# Patient Record
Sex: Female | Born: 1940 | Race: White | Hispanic: No | Marital: Married | Smoking: Former smoker
Health system: Southern US, Community
[De-identification: ages and names within clinical notes are randomized; demographics above are authoritative.]

## PROBLEM LIST (undated history)

## (undated) DIAGNOSIS — G4733 Obstructive sleep apnea (adult) (pediatric): Secondary | ICD-10-CM

## (undated) DIAGNOSIS — R58 Hemorrhage, not elsewhere classified: Secondary | ICD-10-CM

## (undated) DIAGNOSIS — I2510 Atherosclerotic heart disease of native coronary artery without angina pectoris: Secondary | ICD-10-CM

## (undated) DIAGNOSIS — Z794 Long term (current) use of insulin: Secondary | ICD-10-CM

## (undated) DIAGNOSIS — J449 Chronic obstructive pulmonary disease, unspecified: Secondary | ICD-10-CM

## (undated) DIAGNOSIS — R609 Edema, unspecified: Secondary | ICD-10-CM

## (undated) DIAGNOSIS — J45909 Unspecified asthma, uncomplicated: Secondary | ICD-10-CM

## (undated) DIAGNOSIS — N183 Chronic kidney disease, stage 3 unspecified: Secondary | ICD-10-CM

## (undated) DIAGNOSIS — R0989 Other specified symptoms and signs involving the circulatory and respiratory systems: Secondary | ICD-10-CM

## (undated) DIAGNOSIS — E119 Type 2 diabetes mellitus without complications: Secondary | ICD-10-CM

## (undated) DIAGNOSIS — I10 Essential (primary) hypertension: Secondary | ICD-10-CM

## (undated) DIAGNOSIS — K219 Gastro-esophageal reflux disease without esophagitis: Secondary | ICD-10-CM

## (undated) DIAGNOSIS — E785 Hyperlipidemia, unspecified: Secondary | ICD-10-CM

## (undated) DIAGNOSIS — F419 Anxiety disorder, unspecified: Secondary | ICD-10-CM

## (undated) DIAGNOSIS — I350 Nonrheumatic aortic (valve) stenosis: Secondary | ICD-10-CM

## (undated) HISTORY — DX: Essential (primary) hypertension: I10

## (undated) HISTORY — DX: Long term (current) use of insulin: E11.9

## (undated) HISTORY — DX: Edema, unspecified: R60.9

## (undated) HISTORY — DX: Obstructive sleep apnea (adult) (pediatric): G47.33

## (undated) HISTORY — DX: Other specified symptoms and signs involving the circulatory and respiratory systems: R09.89

## (undated) HISTORY — PX: APPENDECTOMY: SHX54

## (undated) HISTORY — PX: CARDIAC CATHETERIZATION: SHX172

## (undated) HISTORY — DX: Unspecified asthma, uncomplicated: J45.909

## (undated) HISTORY — DX: Anxiety disorder, unspecified: F41.9

## (undated) HISTORY — DX: Long term (current) use of insulin: Z79.4

## (undated) HISTORY — DX: Gastro-esophageal reflux disease without esophagitis: K21.9

## (undated) HISTORY — PX: PERCUTANEOUS CORONARY STENT INTERVENTION (PCI-S): SHX6016

## (undated) HISTORY — DX: Chronic obstructive pulmonary disease, unspecified: J44.9

---

## 1999-11-09 ENCOUNTER — Encounter: Payer: Self-pay | Admitting: Cardiology

## 1999-11-09 ENCOUNTER — Inpatient Hospital Stay (HOSPITAL_COMMUNITY): Admission: EM | Admit: 1999-11-09 | Discharge: 1999-11-20 | Payer: Self-pay | Admitting: Cardiology

## 1999-11-11 ENCOUNTER — Encounter: Payer: Self-pay | Admitting: Cardiology

## 1999-11-18 HISTORY — PX: CARDIAC CATHETERIZATION: SHX172

## 1999-12-28 ENCOUNTER — Ambulatory Visit: Admission: RE | Admit: 1999-12-28 | Discharge: 1999-12-28 | Payer: Self-pay | Admitting: Pulmonary Disease

## 2002-02-13 ENCOUNTER — Encounter: Payer: Self-pay | Admitting: Cardiology

## 2002-02-13 ENCOUNTER — Inpatient Hospital Stay (HOSPITAL_COMMUNITY): Admission: EM | Admit: 2002-02-13 | Discharge: 2002-02-18 | Payer: Self-pay | Admitting: Emergency Medicine

## 2002-02-17 HISTORY — PX: PERCUTANEOUS CORONARY STENT INTERVENTION (PCI-S): SHX6016

## 2002-04-29 ENCOUNTER — Encounter: Payer: Self-pay | Admitting: Cardiology

## 2002-04-29 ENCOUNTER — Ambulatory Visit (HOSPITAL_COMMUNITY): Admission: RE | Admit: 2002-04-29 | Discharge: 2002-04-30 | Payer: Self-pay | Admitting: Cardiology

## 2002-04-29 HISTORY — PX: CORONARY ANGIOPLASTY: SHX604

## 2003-02-11 ENCOUNTER — Ambulatory Visit (HOSPITAL_COMMUNITY): Admission: RE | Admit: 2003-02-11 | Discharge: 2003-02-12 | Payer: Self-pay | Admitting: Cardiology

## 2005-01-27 ENCOUNTER — Ambulatory Visit: Payer: Self-pay | Admitting: Cardiology

## 2005-02-06 ENCOUNTER — Ambulatory Visit: Payer: Self-pay | Admitting: Cardiology

## 2005-05-17 ENCOUNTER — Ambulatory Visit: Payer: Self-pay | Admitting: Cardiology

## 2006-01-23 ENCOUNTER — Ambulatory Visit: Payer: Self-pay | Admitting: Cardiology

## 2007-02-07 ENCOUNTER — Ambulatory Visit: Payer: Self-pay | Admitting: Cardiology

## 2007-02-21 ENCOUNTER — Ambulatory Visit: Payer: Self-pay | Admitting: Internal Medicine

## 2007-02-21 ENCOUNTER — Encounter: Payer: Self-pay | Admitting: Cardiology

## 2007-02-21 ENCOUNTER — Ambulatory Visit: Payer: Self-pay

## 2007-06-19 ENCOUNTER — Ambulatory Visit: Payer: Self-pay | Admitting: Cardiology

## 2007-06-19 LAB — CONVERTED CEMR LAB
BUN: 17 mg/dL (ref 6–23)
Creatinine, Ser: 1.5 mg/dL — ABNORMAL HIGH (ref 0.4–1.2)
GFR calc non Af Amer: 37 mL/min
Magnesium: 1.7 mg/dL (ref 1.5–2.5)

## 2007-06-27 ENCOUNTER — Ambulatory Visit: Payer: Self-pay | Admitting: Cardiology

## 2007-06-27 LAB — CONVERTED CEMR LAB
BUN: 16 mg/dL (ref 6–23)
Calcium: 9.2 mg/dL (ref 8.4–10.5)
Chloride: 104 meq/L (ref 96–112)
Glucose, Bld: 123 mg/dL — ABNORMAL HIGH (ref 70–99)
Potassium: 4 meq/L (ref 3.5–5.1)
Sodium: 139 meq/L (ref 135–145)

## 2007-07-03 ENCOUNTER — Ambulatory Visit: Payer: Self-pay | Admitting: Cardiology

## 2007-07-03 LAB — CONVERTED CEMR LAB
CO2: 29 meq/L (ref 19–32)
GFR calc Af Amer: 53 mL/min
GFR calc non Af Amer: 43 mL/min
Glucose, Bld: 139 mg/dL — ABNORMAL HIGH (ref 70–99)

## 2007-07-30 ENCOUNTER — Ambulatory Visit: Payer: Self-pay | Admitting: Cardiology

## 2007-09-11 ENCOUNTER — Ambulatory Visit: Payer: Self-pay | Admitting: Cardiology

## 2007-09-11 LAB — CONVERTED CEMR LAB
BUN: 9 mg/dL (ref 6–23)
Calcium: 9.7 mg/dL (ref 8.4–10.5)
Chloride: 105 meq/L (ref 96–112)
Creatinine, Ser: 1.2 mg/dL (ref 0.4–1.2)
GFR calc non Af Amer: 47 mL/min

## 2007-11-07 ENCOUNTER — Ambulatory Visit: Payer: Self-pay | Admitting: Cardiology

## 2008-05-14 ENCOUNTER — Ambulatory Visit: Payer: Self-pay | Admitting: Cardiology

## 2008-12-31 ENCOUNTER — Ambulatory Visit: Payer: Self-pay | Admitting: Cardiology

## 2009-04-22 ENCOUNTER — Ambulatory Visit: Payer: Self-pay | Admitting: Cardiology

## 2009-04-22 DIAGNOSIS — R0989 Other specified symptoms and signs involving the circulatory and respiratory systems: Secondary | ICD-10-CM | POA: Insufficient documentation

## 2009-04-22 DIAGNOSIS — J45901 Unspecified asthma with (acute) exacerbation: Secondary | ICD-10-CM | POA: Insufficient documentation

## 2009-04-22 DIAGNOSIS — I2510 Atherosclerotic heart disease of native coronary artery without angina pectoris: Secondary | ICD-10-CM | POA: Insufficient documentation

## 2009-04-22 DIAGNOSIS — E7800 Pure hypercholesterolemia, unspecified: Secondary | ICD-10-CM | POA: Insufficient documentation

## 2009-04-28 ENCOUNTER — Ambulatory Visit: Payer: Self-pay

## 2009-04-28 ENCOUNTER — Encounter: Payer: Self-pay | Admitting: Cardiology

## 2009-07-28 ENCOUNTER — Encounter (INDEPENDENT_AMBULATORY_CARE_PROVIDER_SITE_OTHER): Payer: Self-pay

## 2009-07-28 ENCOUNTER — Ambulatory Visit: Payer: Self-pay | Admitting: Cardiology

## 2009-07-28 ENCOUNTER — Encounter: Payer: Self-pay | Admitting: Cardiology

## 2009-08-02 ENCOUNTER — Ambulatory Visit: Payer: Self-pay | Admitting: Cardiology

## 2009-08-02 ENCOUNTER — Inpatient Hospital Stay (HOSPITAL_BASED_OUTPATIENT_CLINIC_OR_DEPARTMENT_OTHER): Admission: RE | Admit: 2009-08-02 | Discharge: 2009-08-02 | Payer: Self-pay | Admitting: Cardiology

## 2009-08-19 ENCOUNTER — Ambulatory Visit: Payer: Self-pay | Admitting: Cardiology

## 2009-08-19 ENCOUNTER — Ambulatory Visit: Payer: Self-pay

## 2009-08-19 ENCOUNTER — Encounter: Payer: Self-pay | Admitting: Cardiology

## 2009-08-19 ENCOUNTER — Ambulatory Visit (HOSPITAL_COMMUNITY): Admission: RE | Admit: 2009-08-19 | Discharge: 2009-08-19 | Payer: Self-pay | Admitting: Cardiology

## 2009-10-04 ENCOUNTER — Ambulatory Visit: Payer: Self-pay | Admitting: Pulmonary Disease

## 2009-10-04 DIAGNOSIS — F411 Generalized anxiety disorder: Secondary | ICD-10-CM | POA: Insufficient documentation

## 2009-10-04 DIAGNOSIS — J449 Chronic obstructive pulmonary disease, unspecified: Secondary | ICD-10-CM | POA: Insufficient documentation

## 2009-10-04 DIAGNOSIS — I10 Essential (primary) hypertension: Secondary | ICD-10-CM | POA: Insufficient documentation

## 2009-10-04 DIAGNOSIS — E119 Type 2 diabetes mellitus without complications: Secondary | ICD-10-CM | POA: Insufficient documentation

## 2009-11-15 ENCOUNTER — Encounter (INDEPENDENT_AMBULATORY_CARE_PROVIDER_SITE_OTHER): Payer: Self-pay

## 2009-11-15 ENCOUNTER — Ambulatory Visit: Payer: Self-pay | Admitting: Cardiology

## 2010-03-10 ENCOUNTER — Ambulatory Visit: Payer: Self-pay | Admitting: Cardiology

## 2010-04-13 ENCOUNTER — Ambulatory Visit: Payer: Self-pay | Admitting: Pulmonary Disease

## 2010-05-20 ENCOUNTER — Encounter: Payer: Self-pay | Admitting: Cardiology

## 2010-07-14 ENCOUNTER — Ambulatory Visit: Payer: Self-pay | Admitting: Pulmonary Disease

## 2010-07-14 DIAGNOSIS — J441 Chronic obstructive pulmonary disease with (acute) exacerbation: Secondary | ICD-10-CM | POA: Insufficient documentation

## 2010-07-21 ENCOUNTER — Ambulatory Visit: Payer: Self-pay | Admitting: Pulmonary Disease

## 2010-07-29 ENCOUNTER — Encounter: Payer: Self-pay | Admitting: Pulmonary Disease

## 2010-08-03 ENCOUNTER — Ambulatory Visit: Payer: Self-pay | Admitting: Internal Medicine

## 2010-08-26 ENCOUNTER — Ambulatory Visit: Payer: Self-pay | Admitting: Pulmonary Disease

## 2010-08-26 LAB — CONVERTED CEMR LAB
Basophils Absolute: 0.2 10*3/uL — ABNORMAL HIGH (ref 0.0–0.1)
HCT: 41 % (ref 36.0–46.0)
Hemoglobin: 14.1 g/dL (ref 12.0–15.0)
IgE (Immunoglobulin E), Serum: 809.1 intl units/mL — ABNORMAL HIGH (ref 0.0–180.0)
MCHC: 34.5 g/dL (ref 30.0–36.0)
MCV: 89.4 fL (ref 78.0–100.0)
Neutro Abs: 8.7 10*3/uL — ABNORMAL HIGH (ref 1.4–7.7)
Neutrophils Relative %: 62.1 % (ref 43.0–77.0)
Platelets: 355 10*3/uL (ref 150.0–400.0)
RDW: 14.7 % — ABNORMAL HIGH (ref 11.5–14.6)

## 2010-09-21 ENCOUNTER — Ambulatory Visit: Payer: Self-pay | Admitting: Pulmonary Disease

## 2010-09-21 DIAGNOSIS — J383 Other diseases of vocal cords: Secondary | ICD-10-CM | POA: Insufficient documentation

## 2010-09-21 DIAGNOSIS — K219 Gastro-esophageal reflux disease without esophagitis: Secondary | ICD-10-CM | POA: Insufficient documentation

## 2010-09-27 ENCOUNTER — Encounter: Payer: Self-pay | Admitting: Pulmonary Disease

## 2010-10-07 ENCOUNTER — Ambulatory Visit: Payer: Self-pay | Admitting: Pulmonary Disease

## 2010-10-10 ENCOUNTER — Inpatient Hospital Stay (HOSPITAL_COMMUNITY)
Admission: EM | Admit: 2010-10-10 | Discharge: 2010-10-15 | Payer: Self-pay | Source: Home / Self Care | Attending: Cardiology | Admitting: Cardiology

## 2010-10-10 ENCOUNTER — Telehealth: Payer: Self-pay | Admitting: Cardiology

## 2010-10-11 ENCOUNTER — Encounter: Payer: Self-pay | Admitting: Cardiology

## 2010-10-12 ENCOUNTER — Encounter: Payer: Self-pay | Admitting: Pulmonary Disease

## 2010-10-26 ENCOUNTER — Ambulatory Visit: Payer: Self-pay | Admitting: Pulmonary Disease

## 2010-11-01 ENCOUNTER — Encounter: Payer: Self-pay | Admitting: Physician Assistant

## 2010-11-02 ENCOUNTER — Ambulatory Visit
Admission: RE | Admit: 2010-11-02 | Discharge: 2010-11-02 | Payer: Self-pay | Source: Home / Self Care | Attending: Physician Assistant | Admitting: Physician Assistant

## 2010-11-02 ENCOUNTER — Encounter: Payer: Self-pay | Admitting: Physician Assistant

## 2010-11-02 ENCOUNTER — Other Ambulatory Visit: Payer: Self-pay | Admitting: Physician Assistant

## 2010-11-02 DIAGNOSIS — R609 Edema, unspecified: Secondary | ICD-10-CM | POA: Insufficient documentation

## 2010-11-02 LAB — HEPATIC FUNCTION PANEL
ALT: 26 U/L (ref 0–35)
AST: 17 U/L (ref 0–37)
Albumin: 3.3 g/dL — ABNORMAL LOW (ref 3.5–5.2)
Alkaline Phosphatase: 48 U/L (ref 39–117)
Bilirubin, Direct: 0.1 mg/dL (ref 0.0–0.3)
Total Bilirubin: 0.6 mg/dL (ref 0.3–1.2)
Total Protein: 6.1 g/dL (ref 6.0–8.3)

## 2010-11-02 LAB — LIPID PANEL
Cholesterol: 234 mg/dL — ABNORMAL HIGH (ref 0–200)
HDL: 70.7 mg/dL (ref >39.00)
Total CHOL/HDL Ratio: 3
Triglycerides: 180 mg/dL — ABNORMAL HIGH (ref 0.0–149.0)
VLDL: 36 mg/dL (ref 0.0–40.0)

## 2010-11-02 LAB — BRAIN NATRIURETIC PEPTIDE: Pro B Natriuretic peptide (BNP): 100.1 pg/mL — ABNORMAL HIGH (ref 0.0–100.0)

## 2010-11-02 LAB — LDL CHOLESTEROL, DIRECT: Direct LDL: 136.1 mg/dL

## 2010-11-02 LAB — BASIC METABOLIC PANEL
BUN: 14 mg/dL (ref 6–23)
CO2: 24 mEq/L (ref 19–32)
Calcium: 9 mg/dL (ref 8.4–10.5)
Chloride: 105 mEq/L (ref 96–112)
Creatinine, Ser: 0.8 mg/dL (ref 0.4–1.2)
GFR: 74.03 mL/min (ref >60.00)
Glucose, Bld: 160 mg/dL — ABNORMAL HIGH (ref 70–99)
Potassium: 4.6 mEq/L (ref 3.5–5.1)
Sodium: 138 mEq/L (ref 135–145)

## 2010-11-03 ENCOUNTER — Encounter: Payer: Self-pay | Admitting: Pulmonary Disease

## 2010-11-27 LAB — CONVERTED CEMR LAB
AST: 21 units/L (ref 0–37)
Albumin: 4.2 g/dL (ref 3.5–5.2)
Alkaline Phosphatase: 45 units/L (ref 39–117)
BUN: 12 mg/dL (ref 6–23)
Calcium: 9.6 mg/dL (ref 8.4–10.5)
Chloride: 110 meq/L (ref 96–112)
Cholesterol: 148 mg/dL (ref 0–200)
Creatinine, Ser: 1.2 mg/dL (ref 0.4–1.2)
Creatinine, Ser: 1.3 mg/dL — ABNORMAL HIGH (ref 0.4–1.2)
Direct LDL: 77 mg/dL
Eosinophils Absolute: 0.6 10*3/uL (ref 0.0–0.7)
Eosinophils Relative: 7.4 % — ABNORMAL HIGH (ref 0.0–5.0)
GFR calc non Af Amer: 47.21 mL/min (ref >60)
Glucose, Bld: 107 mg/dL — ABNORMAL HIGH (ref 70–99)
Glucose, Bld: 143 mg/dL — ABNORMAL HIGH (ref 70–99)
HDL: 44.2 mg/dL (ref >39.00)
INR: 0.9 (ref 0.8–1.0)
Lymphs Abs: 2.7 10*3/uL (ref 0.7–4.0)
MCHC: 33.5 g/dL (ref 30.0–36.0)
Monocytes Absolute: 0.6 10*3/uL (ref 0.1–1.0)
Monocytes Relative: 8.3 % (ref 3.0–12.0)
Neutrophils Relative %: 48 % (ref 43.0–77.0)
Platelets: 232 10*3/uL (ref 150.0–400.0)
Potassium: 4.5 meq/L (ref 3.5–5.1)
Sodium: 142 meq/L (ref 135–145)
Sodium: 144 meq/L (ref 135–145)
Total Bilirubin: 0.7 mg/dL (ref 0.3–1.2)
VLDL: 44 mg/dL — ABNORMAL HIGH (ref 0.0–40.0)

## 2010-11-29 ENCOUNTER — Ambulatory Visit
Admission: RE | Admit: 2010-11-29 | Discharge: 2010-11-29 | Payer: Self-pay | Source: Home / Self Care | Attending: Pulmonary Disease | Admitting: Pulmonary Disease

## 2010-11-29 DIAGNOSIS — G4733 Obstructive sleep apnea (adult) (pediatric): Secondary | ICD-10-CM | POA: Insufficient documentation

## 2010-11-29 DIAGNOSIS — Z9989 Dependence on other enabling machines and devices: Secondary | ICD-10-CM | POA: Insufficient documentation

## 2010-11-29 NOTE — Medication Information (Signed)
Summary: [facility] Services  [employer]   Imported By: Lester Dunreith 09/06/2010 10:18:27  _____________________________________________________________________  External Attachment:    Type:   Image     Comment:   External Document

## 2010-11-29 NOTE — Assessment & Plan Note (Signed)
Summary: [name] Lane   Visit Type:  Follow-up Copy to:  [name]. Riley Kill Primary Provider/Referring Provider:  Reather Littler, FNP   CC:  Pt here for overdue follow up. Pt states breathing is the same worse in hot weather.  History of Present Illness: 70/F, ex- smoker, quit 2001 for FU of COPD/asthma  . used to see [name] Lane, last saw MW in 4/08 -enalapril dc'd..  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 Triggers being activity, perfumes, spring allergies (claritin has helped this year) , cold weather etc. Recent cardiac evaluation by [name] Riley Kill noted. CXR 9/10 >> no acute cardiopulm process. Spirometry >> FEv1 48 % c/w severe airway obstruction. upper airway  pseudo wheeze which improves on calming down Son (EMT) reports severe anxiety, now off ativan for unclear reason.  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 Take One Tablet About Every Other Day 2)  Fluoxetine Hcl 20 Mg Caps (Fluoxetine Hcl) .... Take 1 Capsule By Mouth Once A Day 3)  Vytorin 10-40 Mg Tabs (Ezetimibe-Simvastatin) .... Take One Tablet By Mouth Dailyat Bedtime 4)  Klor-Con M10 10 Meq Cr-Tabs (Potassium Chloride Crys Cr) .... Take 1 Tablet By Mouth Once A Day 5)  Ranitidine Hcl 150 Mg Caps (Ranitidine Hcl) .... Take 1 Capsule By Mouth Two Times A Day 6)  Isosorbide Mononitrate Cr 30 Mg Xr24h-Tab (Isosorbide Mononitrate) .... Take One Tablet By Mouth As Needed 7)  Aspirin 81 Mg Tbec (Aspirin) .... Take One Tablet By Mouth Daily 8)  Metformin Hcl 1000 Mg Tabs (Metformin Hcl) .... Take 1 Tablet By Mouth Two Times A Day 9)  Amlodipine Besylate 5 Mg Tabs (Amlodipine Besylate) .... Take One Tablet By Mouth Daily 10)  Advair Diskus 500-50 Mcg/dose Misc (Fluticasone-Salmeterol) .... One Puff Two Times A Day 11)  Atenolol 25 Mg Tabs  (Atenolol) .... Take 1/2 Tablet Two Times A Day 12)  Duoneb 0.5-2.5 (3) Mg/19ml Soln (Ipratropium-Albuterol) .... As Needed 13)  Spiriva Handihaler 18 Mcg Caps (Tiotropium Bromide Monohydrate) .... Uad 14)  Allergy Relief 10 Mg Tabs (Loratadine) .... Once A Day  Allergies (verified): 1)  ! Morphine 2)  ! Plavix (Clopidogrel Bisulfate)  Past History:  Past Medical History: Last updated: 03/09/2010 Current Problems:  CAD, NATIVE VESSEL (ICD-414.01) HYPERTENSION (ICD-401.9) HYPERCHOLESTEROLEMIA  IIA (ICD-272.0) CAROTID BRUIT, RIGHT (ICD-785.9) Hx of MI (ICD-410.90) C O P D (ICD-496) ANXIETY STATE, UNSPECIFIED (ICD-300.00) DIABETES MELLITUS, TYPE II (ICD-250.00) ASTHMA, UNSPECIFIED, UNSPECIFIED STATUS (ICD-493.90)    Social History: Last updated: 03/09/2010 Single, lives in [place], nonsmoker Patient states former smoker.  retired  Review of Systems       The patient complains of dyspnea on exertion.  The patient denies anorexia, fever, weight loss, weight gain, vision loss, decreased hearing, hoarseness, chest pain, syncope, peripheral edema, prolonged cough, headaches, hemoptysis, abdominal pain, melena, hematochezia, severe indigestion/heartburn, hematuria, muscle weakness, suspicious skin lesions, transient blindness, difficulty walking, depression, unusual weight change, and abnormal bleeding.    Vital Signs:  Patient profile:   70 year old female Height:      63 inches Weight:      189 pounds BMI:     33.60 O2 Sat:      96 % on Room air Temp:     98.5 degrees F oral Pulse rate:  79 / minute BP sitting:   104 / 60  (right arm) Cuff size:   large  Vitals Entered By: Zackery Barefoot CMA (April 13, 2010 11:58 AM)  O2 Flow:  Room air CC: Pt here for overdue follow up. Pt states breathing is the same worse in hot weather Comments Medications reviewed with patient Verified contact number and pharmacy with patient Zackery Barefoot CMA  April 13, 2010 11:59 AM     Physical Exam  Additional Exam:  Gen. Pleasant, well-nourished, in no distress, normal affect ENT - no lesions, no post nasal drip, upper airway  pseudo wheeze which improves on calming down Neck: No JVD, no thyromegaly, no carotid bruits Lungs: no use of accessory muscles, no dullness to percussion, clear without rales or rhonchi  Cardiovascular: Rhythm regular, heart sounds  normal, no murmurs or gallops, no peripheral edema Musculoskeletal: No deformities, no cyanosis or clubbing      Impression & Recommendations:  Problem # 1:  C O P D (ICD-496) stay on advair Spiriva Rx sent we discussed signs of exacerbation & plan in case such happens. OA would prevent her participation in pulm rehab  Problem # 2:  ANXIETY STATE, UNSPECIFIED (ICD-300.00)  -also contributing to dyspnea Her updated medication list for this problem includes:    Fluoxetine Hcl 20 Mg Caps (Fluoxetine hcl) .Marland Kitchen... Take 1 capsule by mouth once a day  Orders: Est. Patient Level III (04540) Prescription Created Electronically [id number])  Patient Instructions: 1)  Copy sent to: [name] Lane 2)  Please schedule a follow-up appointment in 4 months with TP 3)  Stay on advair , spiriva  Rx sent  4)  we discussed plan for exacerbation Prescriptions: SPIRIVA HANDIHALER 18 MCG CAPS (TIOTROPIUM BROMIDE MONOHYDRATE) UAD  #1 x 5   Entered and Authorized by:   Comer Locket Vassie Loll MD   Signed by:   Comer Locket Vassie Loll MD on 04/13/2010   Method used:   Electronically to        [facility] [id number]* (retail)       [address]       [place], [place]  [address]       Ph: [phone]       Fax: [phone]   RxID:   [id number]

## 2010-11-29 NOTE — Assessment & Plan Note (Signed)
Summary: rov 3 wks ///kp   Visit Type:  Follow-up Copy to:  Dr. Riley Kill Primary Provider/Referring Provider:  Reather Littler, FNP   CC:  3 week follow up. Pt c/o wheezing, chest tightness, dry cough worse yesterday. Pt c/o having "a hat on her head", and son states pt is easily confused and no sure what medications she is taking .  History of Present Illness: 69/F, ex- smoker, quit 2001 for FU of COPD/asthma  . used to see Dr Marcos Eke, last saw MW in 4/08 -enalapril dc'd. 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 maintained on advair/ albuterol nebs once/ wk. Triggers being activity, perfumes, spring allergies (claritin has helped this year) , cold weather etc. Cardiac evaluation by Dr Riley Kill noted. CXR 9/10 >> no acute cardiopulm process. Spirometry >> FEv1 48 % c/w severe airway obstruction. upper airway  pseudo wheeze which improves on calming down Son (EMT) reports severe anxiety, now off ativan for unclear reason. Added  spiriva june '11 to improve exercise tolerance - good results  July 14, 2010 4:02 PM  c/o cough, wheezing, increased SOB. STarted in july'11 -Pt was treated with Prednisone taperand abx x 2 - improved then worse again since  last week. woresning cough & bspasm in office requiring neb & solumedrol IM . CXR clear     August 26, 2010 11:34 AM  2 interim visits for persistent cough & bronchospasm - improves with steroids, but starts back after taper. Dexilant helped somewhat - denies heartburn. Has a bird- cockatoo, clean cage. Using duonebs once daily , ON atenolol since 2001, WC 14k  September 21, 2010 10:45 AM  Saw PMD & got more prednisone 50 mg down to 20 mg now, was doing 'remarkably' per son until yesterday - worsening wheeze - Audible pseudowheeze that improves on pursed lip breathing & calming down, white mucoid phlegm. back on ativan 1mg  , compliant with advair, spiriva & albuterol RAST - mildly pos for cat & dog dander Confused between dexilant, ranitidine &  prilosec Denies chest pain,  orthopnea, hemoptysis, fever, n/v/d, edema, headache.      Preventive Screening-Counseling & Management  Alcohol-Tobacco     Smoking Status: quit     Packs/Day: 1.5     Year Started: 1970     Year Quit: 2001  Allergies (verified): 1)  ! Morphine 2)  ! Plavix (Clopidogrel Bisulfate) 3)  ! * Benzonatate  Past History:  Past Medical History: Last updated: 03/09/2010 Current Problems:  CAD, NATIVE VESSEL (ICD-414.01) HYPERTENSION (ICD-401.9) HYPERCHOLESTEROLEMIA  IIA (ICD-272.0) CAROTID BRUIT, RIGHT (ICD-785.9) Hx of MI (ICD-410.90) C O P D (ICD-496) ANXIETY STATE, UNSPECIFIED (ICD-300.00) DIABETES MELLITUS, TYPE II (ICD-250.00) ASTHMA, UNSPECIFIED, UNSPECIFIED STATUS (ICD-493.90)    Social History: Last updated: 07/21/2010 Single, lives in [place], nonsmoker Patient states former smoker. since 2001 retired  Review of Systems       The patient complains of dyspnea on exertion and prolonged cough.  The patient denies anorexia, fever, weight loss, weight gain, vision loss, decreased hearing, hoarseness, chest pain, syncope, peripheral edema, headaches, hemoptysis, abdominal pain, melena, hematochezia, severe indigestion/heartburn, hematuria, muscle weakness, suspicious skin lesions, difficulty walking, depression, unusual weight change, abnormal bleeding, enlarged lymph nodes, and angioedema.    Vital Signs:  Patient profile:   70 year old female Height:      63 inches Weight:      201.2 pounds O2 Sat:      96 % on Room air Temp:     97.8 degrees F oral  Pulse rate:   104 / minute BP sitting:   134 / 78  (right arm) Cuff size:   large  Vitals Entered By: Zackery Barefoot CMA (September 21, 2010 10:30 AM)  O2 Flow:  Room air CC: 3 week follow up. Pt c/o wheezing, chest tightness, dry cough worse yesterday. Pt c/o having "a hat on her head", son states pt is easily confused and no sure what medications she is taking  Comments  Medications reviewed with patient Verified contact number and pharmacy with patient Zackery Barefoot [facility]  September 21, 2010 10:30 AM    Physical Exam  Additional Exam:  wt 197 August 26, 2010  Gen. Pleasant, well-nourished, in no distress, normal affect ENT - no lesions, clear nasal mucus Neck: No JVD, no thyromegaly, no carotid bruits Lungs: coarse BS , audible upper airway pseudowheeze Cardiovascular: Rhythm regular, heart sounds  normal, no murmurs or gallops, no peripheral edema Musculoskeletal: No deformities, no cyanosis or clubbing Skin- noleasions     Impression & Recommendations:  Problem # 1:  C O P D WITH ACUTE EXACERBATION (ICD-491.21) Increase back up to  40 mg pred x  1wk, then 30 mg until seen by [place] again If better , taper by 5-10 mg per week Orders: Depo- Medrol 80mg  (J1040) Admin of Therapeutic Inj  intramuscular or subcutaneous ([address]) ENT Referral (ENT) DME Referral (DME) Est. 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 Patient Level V (91478) Prescription Created Electronically [id number])  Problem # 3:  DIABETES MELLITUS, TYPE II (ICD-250.00) High sugars related to steroids May benefit from adding lantus or januvia to metformin since longer course of prednisone planned, defer to PMD Her updated medication list for this problem includes:    Aspirin 81 Mg Tbec (Aspirin) .Marland Kitchen... Take one tablet by mouth daily    Metformin Hcl 1000 Mg Tabs (Metformin hcl) .Marland Kitchen... Take 1 tablet by mouth two times a day  Problem # 4:  ANXIETY STATE, UNSPECIFIED (ICD-300.00)  Her updated medication list for this problem includes:    Fluoxetine Hcl 20 Mg Caps (Fluoxetine hcl) .Marland Kitchen... Take 1 capsule by mouth once a day    Ativan 1 Mg Tabs (Lorazepam) .Marland Kitchen... Take 1 tab by mouth at bedtime  Orders: Est.  Patient Level V (13086) Prescription Created Electronically [phone])  Problem # 5:  G E REFLUX (ICD-530.81)  discussed no pharmac measures,  The following medications were removed from the medication list:    Prilosec Otc 20 Mg Tbec (Omeprazole magnesium) .Marland Kitchen... Take 1 tablet by mouth once a day Her updated medication list for this problem includes:    Ranitidine Hcl 150 Mg Caps (Ranitidine hcl) .Marland Kitchen... Take 1 capsule by mouth two times a day    Dexilant 60 Mg Cpdr (Dexlansoprazole) ..... Once daily  Orders: Est. Patient Level V ([address]295) Prescription Created Electronically [phone])  Medications Added to Medication List This Visit: 1)  Duoneb 0.5-2.5 (3) Mg/37ml Soln (Ipratropium-albuterol) .... Four times a day as needed 2)  Spiriva Handihaler 18 Mcg Caps (Tiotropium bromide monohydrate) .... Once daily 3)  Ativan 1 Mg Tabs (Lorazepam) .... Take 1 tab by mouth at bedtime 4)  Prednisone 10 Mg Tabs (Prednisone) .... Take 4 tabs  daily with food x 7 days, then 3 tabs daily  Patient Instructions: 1)  Copy sent to: Verlon Au sharp NP 2)  Please  schedule a follow-up appointment in 2 weeks with TP 3)  Depot medrol shot 80 mg IM 4)  Pursed lip breathing when you are wheezing loudly 5)  It is recommended that you schedule a consultation with an ENT specialist: 6)  Ask leslie to start another medication for high sugars (? insulin) 7)  Stay on duonebs two times a day  8)  Portable nebuliser 9)  Take the bird away 10)  Reflux - small meals, elevate Head of bed 11)  Prednisone 40 mg once daily  x 1week, then 30 mg once daily  Prescriptions: DUONEB 0.5-2.5 (3) MG/3ML SOLN (IPRATROPIUM-ALBUTEROL) four times a day as needed  #120 x prn   Entered by:   Zackery Barefoot CMA   Authorized by:   Comer Locket. Vassie Loll MD   Signed by:   Zackery Barefoot CMA on 09/21/2010   Method used:   Printed then faxed to ...       [facility] [id number]* (retail)       [address]       [place], [place]  [address]       Ph:  [phone]       Fax: [phone]   RxID:   [id number] PREDNISONE 10 MG TABS (PREDNISONE) Take 4 tabs  daily with food x 7 days, then 3 tabs daily  #60 x 1   Entered and Authorized by:   Comer Locket. Vassie Loll MD   Signed by:   Comer Locket Vassie Loll MD on 09/21/2010   Method used:   Electronically to        [facility] [id number]* (retail)       [address]       [place], [place]  [address]       Ph: [phone]       Fax: [phone]   RxID:   [id number]    Immunization History:  Influenza Immunization History:    Influenza:  historical (09/07/2010)    Medication Administration  Injection # 1:    Medication: Depo- Medrol 80mg     Diagnosis: C O P D WITH ACUTE EXACERBATION (ICD-491.21)    Route: IM    Site: LUOQ gluteus    Exp Date: 03/2013    Lot #: [id number]    Mfr: [employer]    Patient tolerated injection without complications    Given by: Gweneth Dimitri RN (September 21, 2010 11:14 AM)  Orders Added: 1)  Depo- Medrol 80mg  [J1040] 2)  Admin of Therapeutic Inj  intramuscular or subcutaneous [[address]] 3)  ENT Referral [ENT] 4)  DME Referral [DME] 5)  Est. Patient Level V [41962] 6)  Prescription Created Electronically [phone]

## 2010-11-29 NOTE — Assessment & Plan Note (Signed)
Summary: 4 MONTH ROV   Visit Type:  4 months follow up Referring Provider:  Dr. Riley Kill Primary Provider:  Reather Littler, FNP   CC:  Sob-congestion- Headaches.  History of Present Illness: Still has congestion.  Worse this time of year.   Has whitish sputum, but not colored.  Better since she moved into her mothers house.   No change in pattern of chest discomfort.  Current Medications (verified): 1)  Furosemide 20 Mg Tabs (Furosemide) .... Take One Tablet About Every Other Day 2)  Fluoxetine Hcl 20 Mg Caps (Fluoxetine Hcl) .... Take 1 Capsule By Mouth Once A Day 3)  Vytorin 10-40 Mg Tabs (Ezetimibe-Simvastatin) .... Take One Tablet By Mouth Dailyat Bedtime 4)  Klor-Con M10 10 Meq Cr-Tabs (Potassium Chloride Crys Cr) .... Take 1 Tablet By Mouth Once A Day 5)  Ranitidine Hcl 150 Mg Caps (Ranitidine Hcl) .... Take 1 Capsule By Mouth Two Times A Day 6)  Isosorbide Mononitrate Cr 30 Mg Xr24h-Tab (Isosorbide Mononitrate) .... Take One Tablet By Mouth As Needed 7)  Aspirin 81 Mg Tbec (Aspirin) .... Take One Tablet By Mouth Daily 8)  Metformin Hcl 1000 Mg Tabs (Metformin Hcl) .... Take 1 Tablet By Mouth Two Times A Day 9)  Amlodipine Besylate 5 Mg Tabs (Amlodipine Besylate) .... Take One Tablet By Mouth Daily 10)  Advair Diskus 500-50 Mcg/dose Misc (Fluticasone-Salmeterol) .... One Puff Two Times A Day 11)  Atenolol 25 Mg Tabs (Atenolol) .... Take 1/2 Tablet Two Times A Day 12)  Duoneb 0.5-2.5 (3) Mg/46ml Soln (Ipratropium-Albuterol) .... As Needed 13)  Spiriva Handihaler 18 Mcg Caps (Tiotropium Bromide Monohydrate) .... Uad 14)  Allergy Relief 10 Mg Tabs (Loratadine) .... Once A Day  Allergies: 1)  ! Morphine 2)  ! Plavix (Clopidogrel Bisulfate)  Vital Signs:  Patient profile:   70 year old female Height:      63 inches Weight:      188 pounds BMI:     33.42 Pulse rate:   84 / minute Pulse rhythm:   regular BP sitting:   130 / 74  (left arm) Cuff size:   large  Vitals Entered By:  Vikki Ports (Mar 10, 2010 10:34 AM)  Physical Exam  General:  Well developed, well nourished, in no acute distress. Head:  normocephalic and atraumatic Eyes:  PERRLA/EOM intact; conjunctiva and lids normal. Lungs:  Prolonged expiration without rales.  No dullness to percussion. Heart:  PMI non displaced.  Normal S1 and S2.  Pos S4.   Abdomen:  Bowel sounds positive; abdomen soft and non-tender without masses, organomegaly, or hernias noted. No hepatosplenomegaly. Extremities:  No clubbing or cyanosis.   EKG  Procedure date:  03/10/2010  Findings:      NSR.  Nonspecific ST and T abnormality.  Impression & Recommendations:  Problem # 1:  CAD, NATIVE VESSEL (ICD-414.01) Continues to remain stable.  No progression of symptoms.  Continue medical theray.  The following medications were removed from the medication list:    Aspirin 81 Mg Tbec (Aspirin) .Marland Kitchen... Take one tablet by mouth daily Her updated medication list for this problem includes:    Isosorbide Mononitrate Cr 30 Mg Xr24h-tab (Isosorbide mononitrate) .Marland Kitchen... Take one tablet by mouth as needed    Aspirin 81 Mg Tbec (Aspirin) .Marland Kitchen... Take one tablet by mouth daily    Amlodipine Besylate 5 Mg Tabs (Amlodipine besylate) .Marland Kitchen... Take one tablet by mouth daily    Atenolol 25 Mg Tabs (Atenolol) .Marland Kitchen... Take 1/2 tablet two times  a day  Orders: EKG w/ Interpretation (93[address])  Problem # 2:  HYPERCHOLESTEROLEMIA  IIA (ICD-272.0) Excellent results earlier in year. Her updated medication list for this problem includes:    Vytorin 10-40 Mg Tabs (Ezetimibe-simvastatin) .Marland Kitchen... Take one tablet by mouth dailyat bedtime  Problem # 3:  C O P D (ICD-496) should see Dr. Vassie Loll in followup.  Spiriva helped her per her report. Her updated medication list for this problem includes:    Advair Diskus 500-50 Mcg/dose Misc (Fluticasone-salmeterol) ..... One puff two times a day    Duoneb 0.5-2.5 (3) Mg/57ml Soln (Ipratropium-albuterol) .Marland Kitchen... As needed     Spiriva Handihaler 18 Mcg Caps (Tiotropium bromide monohydrate) ..... Uad  Problem # 4:  HYPERTENSION (ICD-401.9) controlled at present.  The following medications were removed from the medication list:    Aspirin 81 Mg Tbec (Aspirin) .Marland Kitchen... Take one tablet by mouth daily Her updated medication list for this problem includes:    Furosemide 20 Mg Tabs (Furosemide) .Marland Kitchen... Take one tablet about every other day    Aspirin 81 Mg Tbec (Aspirin) .Marland Kitchen... Take one tablet by mouth daily    Amlodipine Besylate 5 Mg Tabs (Amlodipine besylate) .Marland Kitchen... Take one tablet by mouth daily    Atenolol 25 Mg Tabs (Atenolol) .Marland Kitchen... Take 1/2 tablet two times a day  Orders: EKG w/ Interpretation ([address])  Patient Instructions: 1)  Your physician wants you to follow-up in:  6 MONTHS.  You will receive a reminder letter in the mail two months in advance. If you don't receive a letter, please call our office to schedule the follow-up appointment. 2)  Your physician recommends that you continue on your current medications as directed. Please refer to the Current Medication list given to you today.

## 2010-11-29 NOTE — Letter (Signed)
Summary: Designer, fashion/clothing, Main Office  [address]   [place], [place] [address]   Phone: [phone]  Fax: [phone]    November 15, 2009  [facility]  [address]  [place], [place] [address]  VH:QIONG[employer] CERRITOS  Juror: # [id number]   Dear Milford Cage:   Vena Rua of [place], [place], [place] has just informed me that she has been chosen to serve on the jury beginning the 22nd day of February, 2011.  She is a patient under my care with the diagnosis of Coronary Artery Disease and Anxiety. I do not feel that she should serve on the jury. I would like to request that Alison Kubicki be excused from jury duty permanently.  Your consideration of this matter is greatly appreciated.  Respectfully,    Herby Abraham, MD

## 2010-11-29 NOTE — Assessment & Plan Note (Signed)
Summary: NP follow up - COPD   Copy to:  Dr. Riley Kill Primary Provider/Referring Provider:  Reather Littler, FNP   CC:  prod cough with green/yellow mucus, wheezing, sinus pressure/congestion with PND, sore throat, and hoarseness x4days .  History of Present Illness: 70/F, ex- smoker, quit 2001 for FU of COPD/asthma  . used to see Dr Marcos Eke, last saw MW in 4/08 -enalapril dc'd..  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 Triggers being activity, perfumes, spring allergies (claritin has helped this year) , cold weather etc. Recent cardiac evaluation by Dr Riley Kill noted. CXR 9/10 >> no acute cardiopulm process. Spirometry >> FEv1 48 % c/w severe airway obstruction. upper airway  pseudo wheeze which improves on calming down Son (EMT) reports severe anxiety, now off ativan for unclear reason.  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 STarted in july'11 -Pt was treated with Prednisone taperand abx x 2 - improved then worse again since  last week. woresning cough & bspasm in office requiring neb & solumedrol IM . CXR clear  July 21, 2010--Presents for 1 week rov per Dr Vassie Loll - SOB is some better but still extremely SOB with exertion - Occas prod ocugh (yellow) - Wheezing  - chest tightness - 3 days left of Doxycycline and on day 3 of pred taper. Starting to turn corner but still wears out easily. Mucus is starting to clear as day goes on.    August 03, 2010 --Presents for an acute office visit. complains of a productive cough with green/yellow mucus, wheezing, sinus pressure/congestion with PND, sore throat, hoarseness x4days . OTC cold meds are not helping. She was seen 2 weeks ago with a slow resolving bronchitis tx w/ slow steroid taper. However it is unclear is she took this as prescribed but did  finish her previous steroid taper. Says she got better w/ almost total resolved cough. However over last 4 days cough started back with thick green mucus. Denies chest pain,  orthopnea, hemoptysis, fever, n/v/d, edema, headache.      Medications Prior to Update: 1)  Furosemide 20 Mg Tabs (Furosemide) .... Take One Tablet About Every Other Day 2)  Fluoxetine Hcl 20 Mg Caps (Fluoxetine Hcl) .... Take 1 Capsule By Mouth Once A Day 3)  Vytorin 10-40 Mg Tabs (Ezetimibe-Simvastatin) .... Take One Tablet By Mouth Dailyat Bedtime 4)  Klor-Con M10 10 Meq Cr-Tabs (Potassium Chloride Crys Cr) .... Take 1 Tablet By Mouth Once A Day 5)  Ranitidine Hcl 150 Mg Caps (Ranitidine Hcl) .... Take 1 Capsule By Mouth Two Times A Day 6)  Isosorbide Mononitrate Cr 30 Mg Xr24h-Tab (Isosorbide Mononitrate) .... Take One Tablet By Mouth As Needed 7)  Aspirin 81 Mg Tbec (Aspirin) .... Take One Tablet By Mouth Daily 8)  Metformin Hcl 1000 Mg Tabs (Metformin Hcl) .... Take 1 Tablet By Mouth Two Times A Day 9)  Amlodipine Besylate 5 Mg Tabs (Amlodipine Besylate) .... Take One Tablet By Mouth Daily 10)  Advair Diskus 500-50 Mcg/dose Misc (Fluticasone-Salmeterol) .... One Puff Two Times A Day 11)  Atenolol 25 Mg Tabs (Atenolol) .... Take 1/2 Tablet Two Times A Day 12)  Duoneb 0.5-2.5 (3) Mg/33ml Soln (Ipratropium-Albuterol) .... As Needed 13)  Spiriva Handihaler 18 Mcg Caps (Tiotropium Bromide Monohydrate) .... Uad 14)  Allergy Relief 10 Mg Tabs (Loratadine) .... Once A Day 15)  Mucinex Dm Maximum Strength 60-1200 Mg Xr12h-Tab (Dextromethorphan-Guaifenesin) .... Take One By Mouth Two Times A Day As Needed 16)  Fexofenadine Hcl 180 Mg Tabs (Fexofenadine Hcl) .Marland Kitchen.. 1 By Mouth Once Daily As Needed Allergies 17)  Prednisone 10 Mg Tabs (Prednisone) .... Take As Directed. 18)  Benzonatate 200 Mg Caps (Benzonatate) .Marland Kitchen.. 1 By Mouth Three Times A Day As Needed Cough 19)  Hydromet 5-1.5 Mg/68ml Syrp (Hydrocodone-Homatropine) .Marland Kitchen.. 1-2 Tsp  Every 4-6 Hr As Needed Cough  Current Medications (verified): 1)  Furosemide 20 Mg Tabs (Furosemide) .... Take One Tablet About Every Other Day 2)  Fluoxetine Hcl 20 Mg Caps (Fluoxetine Hcl) .... Take 1 Capsule By Mouth Once A Day 3)  Vytorin 10-40 Mg Tabs (Ezetimibe-Simvastatin) .... Take One Tablet By Mouth Dailyat Bedtime 4)  Klor-Con M10 10 Meq Cr-Tabs (Potassium Chloride Crys Cr) .... Take 1 Tablet By Mouth Once A Day 5)  Ranitidine Hcl 150 Mg Caps (Ranitidine Hcl) .... Take 1 Capsule By Mouth Two Times A Day 6)  Isosorbide Mononitrate Cr 30 Mg Xr24h-Tab (Isosorbide Mononitrate) .... Take One Tablet By Mouth As Needed 7)  Aspirin 81 Mg Tbec (Aspirin) .... Take One Tablet By Mouth Daily 8)  Metformin Hcl 1000 Mg Tabs (Metformin Hcl) .... Take 1 Tablet By Mouth Two Times A Day 9)  Amlodipine Besylate 5 Mg Tabs (Amlodipine Besylate) .... Take One Tablet By Mouth Daily 10)  Advair Diskus 500-50 Mcg/dose Misc (Fluticasone-Salmeterol) .... One Puff Two Times A Day 11)  Atenolol 25 Mg Tabs (Atenolol) .... Take 1/2 Tablet Two Times A Day 12)  Duoneb 0.5-2.5 (3) Mg/42ml Soln (Ipratropium-Albuterol) .... As Needed 13)  Spiriva Handihaler 18 Mcg Caps (Tiotropium Bromide Monohydrate) .... Uad 14)  Allergy Relief 10 Mg Tabs (Loratadine) .... Once A Day 15)  Mucinex Dm Maximum Strength 60-1200 Mg Xr12h-Tab (Dextromethorphan-Guaifenesin) .... Take One By Mouth Two Times A Day As Needed 16)  Fexofenadine Hcl 180 Mg Tabs (Fexofenadine Hcl) .Marland Kitchen.. 1 By Mouth Once Daily As Needed Allergies 17)  Prednisone 10 Mg Tabs (Prednisone) .... ***hold Per Dr. Eugenie Norrie As Directed. 18)  Hydromet 5-1.5 Mg/25ml Syrp (Hydrocodone-Homatropine) .Marland Kitchen.. 1-2 Tsp Every 4-6 Hr As Needed Cough  Allergies (verified): 1)  ! Morphine 2)  ! Plavix (Clopidogrel Bisulfate) 3)  ! * Benzonatate  Past History:  Past Medical History: Last updated: 03/09/2010 Current Problems:  CAD, NATIVE VESSEL (ICD-414.01) HYPERTENSION  (ICD-401.9) HYPERCHOLESTEROLEMIA  IIA (ICD-272.0) CAROTID BRUIT, RIGHT (ICD-785.9) Hx of MI (ICD-410.90) C O P D (ICD-496) ANXIETY STATE, UNSPECIFIED (ICD-300.00) DIABETES MELLITUS, TYPE II (ICD-250.00) ASTHMA, UNSPECIFIED, UNSPECIFIED STATUS (ICD-493.90)    Past Surgical History: Last updated: 03/09/2010 Appendectomy  percutaneous coronary  intervention.   Family History: Last updated: 03/09/2010 Family History MI/Heart Attack-father and mother Family History Breast Cancer- mother  Social History: Last updated: 07/21/2010 Single, lives in [place], nonsmoker Patient states former smoker. since 2001 retired  Risk Factors: Smoking Status: quit (07/14/2010) Packs/Day: 1.5 (07/14/2010)  Review of Systems      See HPI  Vital Signs:  Patient profile:   70 year old female Height:      63 inches Weight:      195.25 pounds BMI:     34.71 O2 Sat:      97 % on Room air Temp:     97.6 degrees F oral Pulse rate:   66 / minute BP sitting:   130 / 80  (left  arm) Cuff size:   regular  Vitals Entered By: Boone Master CNA/MA (August 03, 2010 11:32 AM)  O2 Flow:  Room air CC: prod cough with green/yellow mucus, wheezing, sinus pressure/congestion with PND, sore throat, hoarseness x4days  Is Patient Diabetic? Yes Comments Medications reviewed with patient Daytime contact number verified with patient. Boone Master CNA/MA  August 03, 2010 11:34 AM    Physical Exam  Additional Exam:  Gen. Pleasant, well-nourished, in no distress, normal affect ENT - no lesions, clear nasal mucus Neck: No JVD, no thyromegaly, no carotid bruits Lungs: coarse BS w/ faint wheeze.  Cardiovascular: Rhythm regular, heart sounds  normal, no murmurs or gallops, no peripheral edema Musculoskeletal: No deformities, no cyanosis or clubbing      Impression & Recommendations:  Problem # 1:  C O P D WITH ACUTE EXACERBATION (ICD-491.21)  Slow to resolve exacerbation  Plan:  Augmentin 875mg   two times a day for 7 days  Mucinex DM two times a day for cough  Hydromet 1-2 tsp every 4-6 hr as needed if still coughing.  GOAL IS TO STOP COUGHING AND CLEARING THROAT.  Use sugarless candy , ice chips, water to help stop cough/throat clearing-NO MINTS.  Add Dexilant 60mg  in am, use randitidine at bedtime   allegra once daily  follow up Dr. Vassie Loll in 2 weeks   Please contact office for sooner follow up if symptoms do not improve or worsen   Orders: Est. Patient Level IV (09811)  Medications Added to Medication List This Visit: 1)  Prednisone 10 Mg Tabs (Prednisone) .... ***hold per dr. lewitt***take as directed. 2)  Amoxicillin-pot Clavulanate 875-125 Mg Tabs (Amoxicillin-pot clavulanate) .Marland Kitchen.. 1 by mouth two times a day  Complete Medication List: 1)  Furosemide 20 Mg Tabs (Furosemide) .... Take one tablet about every other day 2)  Fluoxetine Hcl 20 Mg Caps (Fluoxetine hcl) .... Take 1 capsule by mouth once a day 3)  Vytorin 10-40 Mg Tabs (Ezetimibe-simvastatin) .... Take one tablet by mouth dailyat bedtime 4)  Klor-con M10 10 Meq Cr-tabs (Potassium chloride crys cr) .... Take 1 tablet by mouth once a day 5)  Ranitidine Hcl 150 Mg Caps (Ranitidine hcl) .... Take 1 capsule by mouth two times a day 6)  Isosorbide Mononitrate Cr 30 Mg Xr24h-tab (Isosorbide mononitrate) .... Take one tablet by mouth as needed 7)  Aspirin 81 Mg Tbec (Aspirin) .... Take one tablet by mouth daily 8)  Metformin Hcl 1000 Mg Tabs (Metformin hcl) .... Take 1 tablet by mouth two times a day 9)  Amlodipine Besylate 5 Mg Tabs (Amlodipine besylate) .... Take one tablet by mouth daily 10)  Advair Diskus 500-50 Mcg/dose Misc (Fluticasone-salmeterol) .... One puff two times a day 11)  Atenolol 25 Mg Tabs (Atenolol) .... Take 1/2 tablet two times a day 12)  Duoneb 0.5-2.5 (3) Mg/36ml Soln (Ipratropium-albuterol) .... As needed 13)  Spiriva Handihaler 18 Mcg Caps (Tiotropium bromide monohydrate) .... Uad 14)  Allergy  Relief 10 Mg Tabs (Loratadine) .... Once a day 15)  Mucinex Dm Maximum Strength 60-1200 Mg Xr12h-tab (Dextromethorphan-guaifenesin) .... Take one by mouth two times a day as needed 16)  Fexofenadine Hcl 180 Mg Tabs (Fexofenadine hcl) .Marland Kitchen.. 1 by mouth once daily as needed allergies 17)  Prednisone 10 Mg Tabs (Prednisone) .... ***hold per dr. lewitt***take as directed. 18)  Hydromet 5-1.5 Mg/56ml Syrp (Hydrocodone-homatropine) .Marland Kitchen.. 1-2 tsp every 4-6 hr as needed cough 19)  Amoxicillin-pot Clavulanate 875-125 Mg Tabs (Amoxicillin-pot clavulanate) .Marland Kitchen.. 1 by  mouth two times a day  Patient Instructions: 1)  Augmentin 875mg  two times a day for 7 days 2)   Mucinex DM two times a day for cough 3)   Hydromet 1-2 tsp every 4-6 hr as needed if still coughing.  4)  GOAL IS TO STOP COUGHING AND CLEARING THROAT.  5)  Use sugarless candy , ice chips, water to help stop cough/throat clearing-NO MINTS.  6)  Add Dexilant 60mg  in am, use randitidine at bedtime  7)   allegra once daily  8)  follow up Dr. Vassie Loll in 2 weeks  9)   Please contact office for sooner follow up if symptoms do not improve or worsen  Prescriptions: AMOXICILLIN-POT CLAVULANATE 875-125 MG TABS (AMOXICILLIN-POT CLAVULANATE) 1 by mouth two times a day  #14 x 0   Entered and Authorized by:   Rubye Oaks NP   Signed by:   Rubye Oaks NP on 08/03/2010   Method used:   Electronically to        [facility] [id number]* (retail)       [address]       [place], [place]  [address]       Ph: [phone]       Fax: [phone]   RxID:   [id number]    Immunization History:  Influenza Immunization History:    Influenza:  historical (07/30/2009)

## 2010-11-29 NOTE — Consult Note (Signed)
Summary: [facility]  [facility]   Imported By: Sherian Rein 10/05/2010 07:55:24  _____________________________________________________________________  External Attachment:    Type:   Image     Comment:   External Document

## 2010-11-29 NOTE — Miscellaneous (Signed)
Summary: Orders Update  Clinical Lists Changes  Orders: Added new Test order of Carotid Duplex (Carotid Duplex) - Signed 

## 2010-11-29 NOTE — Assessment & Plan Note (Signed)
Summary: f/u - ok per jj///kp   Visit Type:  Follow-up Copy to:  Dr. Riley Kill Primary Provider/Referring Provider:  Reather Littler, FNP   CC:  Pt c/o productive cough with thick yellow sticky mucus, wheezing, chest tightness and congestion, and SOB with exertion.  History of Present Illness: 69/F, ex- smoker, quit 2001 for FU of COPD/asthma  . used to see Dr Marcos Eke, last saw MW in 4/08 -enalapril dc'd. 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 maintained on advair/ albuterol nebs once/ wk. Triggers being activity, perfumes, spring allergies (claritin has helped this year) , cold weather etc. Recent cardiac evaluation by Dr Riley Kill noted. CXR 9/10 >> no acute cardiopulm process. Spirometry >> FEv1 48 % c/w severe airway obstruction. upper airway  pseudo wheeze which improves on calming down Son (EMT) reports severe anxiety, now off ativan for unclear reason. Added  spiriva june '11 to improve exercise tolerance - good results  July 14, 2010 4:02 PM  c/o cough, wheezing, increased SOB. STarted in july'11 -Pt was treated with Prednisone taperand abx x 2 - improved then worse again since  last week. woresning cough & bspasm in office requiring neb & solumedrol IM . CXR clear     August 26, 2010 11:34 AM  2 interim visits for persistent cough & bronchospasm - improves with steroids, but starts back after taper. Dexilant helped somewhat - denies heartburn. Has a bird- cockatoo, clean cage. Using duonebs once daily , ON atenolol since 2001, WC 14k Denies chest pain,  orthopnea, hemoptysis, fever, n/v/d, edema, headache.      Preventive Screening-Counseling & Management  Alcohol-Tobacco     Smoking Status: quit     Packs/Day: 1.5     Year Started: 1970     Year Quit: 2001  Current Medications (verified): 1)  Furosemide 20 Mg Tabs (Furosemide) .... Take One Tablet About Every Other Day 2)  Fluoxetine Hcl 20 Mg Caps (Fluoxetine Hcl) .... Take 1 Capsule By Mouth Once A Day 3)  Vytorin 10-40 Mg  Tabs (Ezetimibe-Simvastatin) .... Take One Tablet By Mouth Dailyat Bedtime 4)  Klor-Con M10 10 Meq Cr-Tabs (Potassium Chloride Crys Cr) .... Take 1 Tablet By Mouth Once A Day 5)  Ranitidine Hcl 150 Mg Caps (Ranitidine Hcl) .... Take 1 Capsule By Mouth Two Times A Day 6)  Isosorbide Mononitrate Cr 30 Mg Xr24h-Tab (Isosorbide Mononitrate) .... Take One Tablet By Mouth As Needed 7)  Aspirin 81 Mg Tbec (Aspirin) .... Take One Tablet By Mouth Daily 8)  Metformin Hcl 1000 Mg Tabs (Metformin Hcl) .... Take 1 Tablet By Mouth Two Times A Day 9)  Amlodipine Besylate 5 Mg Tabs (Amlodipine Besylate) .... Take One Tablet By Mouth Daily 10)  Advair Diskus 500-50 Mcg/dose Misc (Fluticasone-Salmeterol) .... One Puff Two Times A Day 11)  Atenolol 25 Mg Tabs (Atenolol) .... Take 1/2 Tablet Two Times A Day 12)  Duoneb 0.5-2.5 (3) Mg/4ml Soln (Ipratropium-Albuterol) .... As Needed 13)  Spiriva Handihaler 18 Mcg Caps (Tiotropium Bromide Monohydrate) .... Uad 14)  Allergy Relief 10 Mg Tabs (Loratadine) .... Once A Day 15)  Mucinex Dm Maximum Strength 60-1200 Mg Xr12h-Tab (Dextromethorphan-Guaifenesin) .... Take One By Mouth Two Times A Day As Needed 16)  Fexofenadine Hcl 180 Mg Tabs (Fexofenadine Hcl) .Marland Kitchen.. 1 By Mouth Once Daily As Needed Allergies 17)  Prednisone 10 Mg Tabs (Prednisone) .... ***hold Per Dr. Eugenie Norrie As Directed. 18)  Hydromet 5-1.5 Mg/60ml Syrp (Hydrocodone-Homatropine) .Marland Kitchen.. 1-2 Tsp Every 4-6 Hr As Needed Cough 19)  Amoxicillin-Pot  Clavulanate 875-125 Mg Tabs (Amoxicillin-Pot Clavulanate) .Marland Kitchen.. 1 By Mouth Two Times A Day 20)  Prilosec Otc 20 Mg Tbec (Omeprazole Magnesium) .... Take 1 Tablet By Mouth Once A Day  Allergies (verified): 1)  ! Morphine 2)  ! Plavix (Clopidogrel Bisulfate) 3)  ! * Benzonatate  Past History:  Past Medical History: Last updated: 03/09/2010 Current Problems:  CAD, NATIVE VESSEL (ICD-414.01) HYPERTENSION (ICD-401.9) HYPERCHOLESTEROLEMIA  IIA (ICD-272.0) CAROTID  BRUIT, RIGHT (ICD-785.9) Hx of MI (ICD-410.90) C O P D (ICD-496) ANXIETY STATE, UNSPECIFIED (ICD-300.00) DIABETES MELLITUS, TYPE II (ICD-250.00) ASTHMA, UNSPECIFIED, UNSPECIFIED STATUS (ICD-493.90)    Social History: Last updated: 07/21/2010 Single, lives in [place], nonsmoker Patient states former smoker. since 2001 retired  Risk Factors: Smoking Status: quit (08/26/2010) Packs/Day: 1.5 (08/26/2010)  Review of Systems       The patient complains of dyspnea on exertion.  The patient denies anorexia, fever, weight loss, weight gain, vision loss, decreased hearing, hoarseness, chest pain, syncope, peripheral edema, prolonged cough, headaches, hemoptysis, abdominal pain, melena, hematochezia, severe indigestion/heartburn, hematuria, muscle weakness, suspicious skin lesions, difficulty walking, depression, unusual weight change, abnormal bleeding, enlarged lymph nodes, and angioedema.    Vital Signs:  Patient profile:   70 year old female Height:      63 inches Weight:      197 pounds BMI:     35.02 O2 Sat:      95 % on Room air Temp:     97.8 degrees F oral Pulse rate:   75 / minute BP sitting:   120 / 70  (left arm) Cuff size:   large  Vitals Entered By: Zackery Barefoot CMA (August 26, 2010 11:05 AM)  O2 Flow:  Room air CC: Pt c/o productive cough with thick yellow sticky mucus, wheezing, chest tightness and congestion, SOB with exertion Comments Medications reviewed with patient Verified contact number and pharmacy with patient Zackery Barefoot [facility]  August 26, 2010 11:07 AM    Physical Exam  Additional Exam:  wt 197 August 26, 2010  Gen. Pleasant, well-nourished, in no distress, normal affect ENT - no lesions, clear nasal mucus Neck: No JVD, no thyromegaly, no carotid bruits Lungs: coarse BS w/ faint wheeze diffusely.  Cardiovascular: Rhythm regular, heart sounds  normal, no murmurs or gallops, no peripheral edema Musculoskeletal: No deformities, no cyanosis  or clubbing      Impression & Recommendations:  Problem # 1:  C O P D WITH ACUTE EXACERBATION (ICD-491.21) Unclear cause of perssitent bronchospasm Possible triggers  GERD - ct dexilant Bird/ cage Allergies - check RAST , change to zyrtec doubt atenolol - has been on this x 10 yrs May need prolonged course of steroids Orders: Est. Patient Level IV ([address]) T-"RAST" (Allergy Full Profile) IGE ([address]11) TLB-CBC Platelet - w/Differential (85025-CBCD) Prescription Created Electronically [phone])  Medications Added to Medication List This Visit: 1)  Prilosec Otc 20 Mg Tbec (Omeprazole magnesium) .... Take 1 tablet by mouth once a day 2)  Prednisone 10 Mg Tabs (Prednisone) .... Once daily x  2 weeks, then 1/2 tab once daily x 2 weeks 3)  Dexilant 60 Mg Cpdr (Dexlansoprazole) .... Once daily  Patient Instructions: 1)  Copy sent to: leslie sharp FNP 2)  Please schedule a follow-up appointment in 3 weeks. 3)  Blood work 4)  Take advair two times a day 5)  Take duonebs two times a day  6)  Give away the bird if possible 7)  Low dose prednisone as directed 8)  Take  mucinex two times a day  9)  Take ZYRTEC instead of loratidine once daily (allergy) Prescriptions: DEXILANT 60 MG CPDR (DEXLANSOPRAZOLE) once daily  #30 x 1   Entered and Authorized by:   Comer Locket. Vassie Loll MD   Signed by:   Comer Locket Vassie Loll MD on 08/26/2010   Method used:   Electronically to        [facility] [id number]* (retail)       [address]       [place], [place]  [address]       Ph: [phone]       Fax: [phone]   RxID:   [id number] PREDNISONE 10 MG TABS (PREDNISONE) once daily x  2 weeks, then 1/2 tab once daily x 2 weeks  #30 x 0   Entered and Authorized by:   Comer Locket Vassie Loll MD   Signed by:   Comer Locket Vassie Loll MD on 08/26/2010   Method used:   Electronically to        [facility] [id number]* (retail)       [address]       [place], [place]  [address]       Ph: [phone]       Fax: [phone]    RxID:   [id number]

## 2010-11-29 NOTE — Assessment & Plan Note (Signed)
Summary: ROV   Visit Type:  Follow-up Referring Provider:  Dr. Riley Kill Primary Provider:  Reather Littler, FNP    History of Present Illness: Dr. Vassie Loll put her on Spiriva, and she thinks it has helped.  When she gets stressed, she will get a little tightness.  She will have some anxiety.  She feels that she can move around better, but does not get out of breath.  Her environment has changed.  She is helping with full time care of a relative.  That has helped some.    Current Medications (verified): 1)  Furosemide 20 Mg Tabs (Furosemide) .... Take One Tablet About Every Other Day 2)  Fluoxetine Hcl 20 Mg Caps (Fluoxetine Hcl) .... Take 1 Capsule By Mouth Once A Day 3)  Vytorin 10-40 Mg Tabs (Ezetimibe-Simvastatin) .... Take One Tablet By Mouth Dailyat Bedtime 4)  Klor-Con M10 10 Meq Cr-Tabs (Potassium Chloride Crys Cr) .... Take 1 Tablet By Mouth Once A Day 5)  Ranitidine Hcl 150 Mg Caps (Ranitidine Hcl) .... Take 1 Capsule By Mouth Two Times A Day 6)  Isosorbide Mononitrate Cr 30 Mg Xr24h-Tab (Isosorbide Mononitrate) .... Take One Tablet By Mouth As Needed 7)  Aspirin 81 Mg Tbec (Aspirin) .... Take One Tablet By Mouth Daily 8)  Metformin Hcl 1000 Mg Tabs (Metformin Hcl) .... Take 1 Tablet By Mouth Two Times A Day 9)  Amlodipine Besylate 5 Mg Tabs (Amlodipine Besylate) .... Take One Tablet By Mouth Daily 10)  Advair Diskus 500-50 Mcg/dose Misc (Fluticasone-Salmeterol) .... One Puff Two Times A Day 11)  Atenolol 25 Mg Tabs (Atenolol) .... Take 1/2 Tablet Two Times A Day 12)  Duoneb 0.5-2.5 (3) Mg/12ml Soln (Ipratropium-Albuterol) .... As Needed 13)  Spiriva Handihaler 18 Mcg Caps (Tiotropium Bromide Monohydrate) .... Uad 14)  Aspirin 81 Mg Tbec (Aspirin) .... Take One Tablet By Mouth Daily  Allergies: 1)  ! Morphine 2)  ! 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 Lungs:  Clear bilaterally to auscultation and percussion.  Less expiratory prolongation compared to prior visits.   Heart:  PMI.  Normal S1 and S2.  Soft S4.   Abdomen:  Bowel sounds positive; abdomen soft and non-tender without masses, organomegaly, or hernias noted. No hepatosplenomegaly. Msk:  Back normal, normal gait. Muscle strength and tone normal. Extremities:  No clubbing or cyanosis. Neurologic:  Alert and oriented x 3.   EKG  Procedure date:  11/15/2009  Findings:      NSR. WNL.  Impression & Recommendations:  Problem # 1:  CAD, NATIVE VESSEL (ICD-414.01) stable at present.  Improved since starting on spiriva. Anxiety is a component.  Suggest follow up with primary MD. Her updated medication list for this problem includes:    Isosorbide Mononitrate Cr 30 Mg  Xr24h-tab (Isosorbide mononitrate) .Marland Kitchen... Take one tablet by mouth as needed    Aspirin 81 Mg Tbec (Aspirin) .Marland Kitchen... Take one tablet by mouth daily    Amlodipine Besylate 5 Mg Tabs (Amlodipine besylate) .Marland Kitchen... Take one tablet by mouth daily    Atenolol 25 Mg Tabs (Atenolol) .Marland Kitchen... Take 1/2 tablet two times a day  Orders: EKG w/ Interpretation (93000)  Problem # 2:  C O P D (ICD-496) clearly better on exam. Her updated medication list for this problem includes:    Advair Diskus 500-50 Mcg/dose Misc (Fluticasone-salmeterol) .....  One puff two times a day    Duoneb 0.5-2.5 (3) Mg/80ml Soln (Ipratropium-albuterol) .Marland Kitchen... As needed    Spiriva Handihaler 18 Mcg Caps (Tiotropium bromide monohydrate) ..... Uad  Problem # 3:  HYPERCHOLESTEROLEMIA  IIA (ICD-272.0)  Will recheck lipid and liver. Her updated medication list for this problem includes:    Vytorin 10-40 Mg Tabs (Ezetimibe-simvastatin) .Marland Kitchen... Take one tablet by mouth dailyat bedtime  Orders: TLB-BMP (Basic Metabolic Panel-BMET) (80048-METABOL) TLB-Lipid Panel (80061-LIPID) TLB-Hepatic/Liver Function Pnl (80076-HEPATIC)  Problem # 4:  HYPERTENSION (ICD-401.9) Will need to check BMET.  Under control at present. Her updated medication list for this problem includes:    Furosemide 20 Mg Tabs (Furosemide) .Marland Kitchen... Take one tablet about every other day    Aspirin 81 Mg Tbec (Aspirin) .Marland Kitchen... Take one tablet by mouth daily    Amlodipine Besylate 5 Mg Tabs (Amlodipine besylate) .Marland Kitchen... Take one tablet by mouth daily    Atenolol 25 Mg Tabs (Atenolol) .Marland Kitchen... Take 1/2 tablet two times a day  Orders: EKG w/ Interpretation (93000) TLB-BMP (Basic Metabolic Panel-BMET) (80048-METABOL) TLB-Lipid Panel (80061-LIPID) TLB-Hepatic/Liver Function Pnl (80076-HEPATIC)  Patient Instructions: 1)  Your physician recommends that you schedule a follow-up appointment in: 4 MONTHS 2)  Your physician recommends that you return for a FASTING LIPID, LIVER, BMP today.  3)  Your physician recommends that you continue on your current medications as directed. Please refer to the Current Medication list given to you today.

## 2010-11-29 NOTE — Assessment & Plan Note (Signed)
Summary: respiratory infection/sob/ mbw   Visit Type:  Acute visit Copy to:  Dr. Riley Kill Primary Provider/Referring Provider:  Reather Littler, FNP   CC:  Pt c/o cough, wheezing, increased SOB. Pt was treated with Prednisone taper, and and abx by PMD x 4 days.  History of Present Illness: 70/F, ex- smoker, quit 2001 for FU of COPD/asthma  . used to see Dr Marcos Eke, last saw MW in 4/08 -enalapril dc'd..  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 Triggers being activity, perfumes, spring allergies (claritin has helped this year) , cold weather etc. Recent cardiac evaluation by Dr Riley Kill noted. CXR 9/10 >> no acute cardiopulm process. Spirometry >> FEv1 48 % c/w severe airway obstruction. upper airway  pseudo wheeze which improves on calming down Son (EMT) reports severe anxiety, now off ativan for unclear reason.  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 STarted in july'11 -Pt was treated with Prednisone taperand abx x 2 - improved then worse again since  last week. woresning cough & bspasm in office requiring neb & solumedrol IM . CXR clear  Preventive Screening-Counseling & Management  Alcohol-Tobacco     Smoking Status: quit     Packs/Day: 1.5     Year Started: 1970     Year Quit: 2001  Current Medications (verified): 1)  Furosemide 20 Mg Tabs (Furosemide) .... Take One Tablet About Every Other Day 2)  Fluoxetine Hcl 20 Mg Caps (Fluoxetine Hcl) .... Take 1 Capsule By Mouth Once A Day 3)  Vytorin 10-40 Mg Tabs (Ezetimibe-Simvastatin) .... Take One Tablet By Mouth Dailyat Bedtime 4)  Klor-Con M10 10 Meq Cr-Tabs (Potassium Chloride Crys Cr) .... Take 1 Tablet By Mouth Once A Day 5)  Ranitidine Hcl 150 Mg Caps (Ranitidine Hcl) .... Take 1 Capsule By Mouth Two Times A Day 6)  Isosorbide  Mononitrate Cr 30 Mg Xr24h-Tab (Isosorbide Mononitrate) .... Take One Tablet By Mouth As Needed 7)  Aspirin 81 Mg Tbec (Aspirin) .... Take One Tablet By Mouth Daily 8)  Metformin Hcl 1000 Mg Tabs (Metformin Hcl) .... Take 1 Tablet By Mouth Two Times A Day 9)  Amlodipine Besylate 5 Mg Tabs (Amlodipine Besylate) .... Take One Tablet By Mouth Daily 10)  Advair Diskus 500-50 Mcg/dose Misc (Fluticasone-Salmeterol) .... One Puff Two Times A Day 11)  Atenolol 25 Mg Tabs (Atenolol) .... Take 1/2 Tablet Two Times A Day 12)  Duoneb 0.5-2.5 (3) Mg/36ml Soln (Ipratropium-Albuterol) .... As Needed 13)  Spiriva Handihaler 18 Mcg Caps (Tiotropium Bromide Monohydrate) .... Uad 14)  Allergy Relief 10 Mg Tabs (Loratadine) .... Once A Day  Allergies (verified): 1)  ! Morphine 2)  ! 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  retired  Review of Systems       The patient complains of dyspnea on exertion and prolonged cough.  The patient denies anorexia, fever, weight loss, weight gain, vision loss, decreased hearing, hoarseness, chest  pain, syncope, peripheral edema, headaches, hemoptysis, abdominal pain, melena, hematochezia, severe indigestion/heartburn, hematuria, muscle weakness, suspicious skin lesions, transient blindness, difficulty walking, depression, unusual weight change, abnormal bleeding, enlarged lymph nodes, and angioedema.    Vital Signs:  Patient profile:   70 year old female Height:      63  inches Weight:      195.38 pounds BMI:     34.74 O2 Sat:      97 % on Room air Temp:     98.1 degrees F oral Pulse rate:   99 / minute BP sitting:   132 / 62  (right arm) Cuff size:   large  Vitals Entered By: Zackery Barefoot CMA (July 14, 2010 3:51 PM)  O2 Flow:  Room air CC: Pt c/o cough, wheezing, increased SOB. Pt was treated with Prednisone taper, and abx by PMD x 4 days Comments Medications reviewed with patient Verified contact number and pharmacy with patient Zackery Barefoot CMA  July 14, 2010 3:52 PM    Physical Exam  Additional Exam:  Gen. Pleasant, well-nourished, in no distress, normal affect ENT - no lesions, no post nasal drip, upper airway  pseudo wheeze which improves on calming down Neck: No JVD, no thyromegaly, no carotid bruits Lungs: no use of accessory muscles, no dullness to percussion, diffuse rhonchi  Cardiovascular: Rhythm regular, heart sounds  normal, no murmurs or gallops, no peripheral edema Musculoskeletal: No deformities, no cyanosis or clubbing      CXR  Procedure date:  07/14/2010  Findings:      IMPRESSION: Stable cardiopulmonary appearance with no new focal or acute abnormality suggested.  Impression & Recommendations:  Problem # 1:  C O P D WITH ACUTE EXACERBATION (ICD-491.21) Depot solumedrol 80 mg IM Xopenex neb x1 Prednisone course as directed Antibiotic x 10 ds Mucinex DM two times a day  Orders: Est. Patient Level IV (56213) Prescription Created Electronically [phone]) Depo- Medrol 80mg  (J1040) Admin of Therapeutic Inj  intramuscular or subcutaneous (84696) Nebulizer Tx (29528) T-2 View CXR (71020TC)  Medications Added to Medication List This Visit: 1)  Prednisone 10 Mg Tabs (Prednisone) .... Take 4 tabs  daily with food x 4 days, then 3 tabs daily x 4 days, then 2 tabs daily x 4 days, then 1 tab daily x4 days then stop. #40 2)  Doxycycline Hyclate 100 Mg Caps (Doxycycline hyclate) .... Once daily  Patient  Instructions: 1)  A chest x-ray has been recommended.  Your imaging study may require preauthorization.  2)  Depot solumedrol 80 mg IM 3)  Xopenex neb x1 4)  Prednisone course as directed 5)  Antibiotic x 10 ds 6)  Please schedule a follow-up appointment in 1 week with TP to ensure treatment is working 7)  Take mucinex -DM two times a day x 2 weeks  8)  Copy sent to: Prudy Feeler MD Prescriptions: DOXYCYCLINE HYCLATE 100 MG CAPS (DOXYCYCLINE HYCLATE) once daily  #10 x 0   Entered and Authorized by:   Comer Locket. Vassie Loll MD   Signed by:   Comer Locket Vassie Loll MD on 07/14/2010   Method used:   Electronically to        [facility] [id number]* (retail)       [address]       [place], [place]  [address]       Ph: [phone]       Fax: [phone]   RxID:   [id number] PREDNISONE 10 MG TABS (PREDNISONE)  Take 4 tabs  daily with food x 4 days, then 3 tabs daily x 4 days, then 2 tabs daily x 4 days, then 1 tab daily x4 days then stop. #40  #40 x 1   Entered and Authorized by:   Comer Locket Vassie Loll MD   Signed by:   Comer Locket Vassie Loll MD on 07/14/2010   Method used:   Electronically to        [facility] [id number] (retail)       [address]       [place], [place]  [address]       Ph: [phone]       Fax: [phone]   RxID:   [id number]    Medication Administration  Injection # 1:    Medication: Depo- Medrol 80mg     Diagnosis: C O P D WITH ACUTE EXACERBATION (ICD-491.21)    Route: IM    Site: R deltoid    Exp Date: 01/2013    Lot #: [id number]    Mfr: [employer]  Medication # 1:    Medication: Xopenex 1.25mg     Diagnosis: C O P D WITH ACUTE EXACERBATION (ICD-491.21)    Route: inhaled    Exp Date: 06/2010    Lot #: [id number]    Mfr: SEPRACOR  Orders Added: 1)  Est. Patient Level IV [22025] 2)  Prescription Created Electronically [[id number]] 3)  Depo- Medrol 80mg  [J1040] 4)  Admin of Therapeutic Inj  intramuscular or subcutaneous [96372] 5)  Nebulizer Tx [94640] 6)  T-2 View CXR [71020TC]

## 2010-11-29 NOTE — Assessment & Plan Note (Signed)
Summary: NP follow up - COPD   Copy to:  Dr. Riley Kill Primary Provider/Referring Provider:  Reather Littler, FNP   CC:  1 week rov per Dr Vassie Loll - SOB is some better but still extremely SOB with exertion - Occas prod ocugh (yellow) - Wheezing  - chest tightness - 3 days left of Doxycycline and on day 3 of pred taper.  History of Present Illness: 70/F, ex- smoker, quit 2001 for FU of COPD/asthma  . used to see Dr Marcos Eke, last saw MW in 4/08 -enalapril dc'd..  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 Triggers being activity, perfumes, spring allergies (claritin has helped this year) , cold weather etc. Recent cardiac evaluation by Dr Riley Kill noted. CXR 9/10 >> no acute cardiopulm process. Spirometry >> FEv1 48 % c/w severe airway obstruction. upper airway  pseudo wheeze which improves on calming down Son (EMT) reports severe anxiety, now off ativan for unclear reason.  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 STarted in july'11 -Pt was treated with Prednisone taperand abx x 2 - improved then worse again since  last week. woresning cough & bspasm in office requiring neb & solumedrol IM . CXR clear  July 21, 2010--Presents for 1 week rov per Dr Vassie Loll - SOB is some better but still extremely SOB with exertion - Occas prod ocugh (yellow) - Wheezing  - chest tightness - 3 days left of Doxycycline and on day 3 of pred taper. Starting to turn corner but still wears out easily. Mucus is starting to clear as day goes on. Denies chest pain,  orthopnea, hemoptysis, fever, n/v/d, edema, headache.      Medications Prior to Update: 1)  Furosemide 20 Mg Tabs (Furosemide) .... Take One Tablet About Every Other Day 2)  Fluoxetine Hcl 20 Mg Caps (Fluoxetine Hcl) .... Take 1 Capsule By Mouth Once A Day 3)  Vytorin  10-40 Mg Tabs (Ezetimibe-Simvastatin) .... Take One Tablet By Mouth Dailyat Bedtime 4)  Klor-Con M10 10 Meq Cr-Tabs (Potassium Chloride Crys Cr) .... Take 1 Tablet By Mouth Once A Day 5)  Ranitidine Hcl 150 Mg Caps (Ranitidine Hcl) .... Take 1 Capsule By Mouth Two Times A Day 6)  Isosorbide Mononitrate Cr 30 Mg Xr24h-Tab (Isosorbide Mononitrate) .... Take One Tablet By Mouth As Needed 7)  Aspirin 81 Mg Tbec (Aspirin) .... Take One Tablet By Mouth Daily 8)  Metformin Hcl 1000 Mg Tabs (Metformin Hcl) .... Take 1 Tablet By Mouth Two Times A Day 9)  Amlodipine Besylate 5 Mg Tabs (Amlodipine Besylate) .... Take One Tablet By Mouth Daily 10)  Advair Diskus 500-50 Mcg/dose Misc (Fluticasone-Salmeterol) .... One Puff Two Times A Day 11)  Atenolol 25 Mg Tabs (Atenolol) .... Take 1/2 Tablet Two Times A Day 12)  Duoneb 0.5-2.5 (3) Mg/28ml Soln (Ipratropium-Albuterol) .... As Needed 13)  Spiriva Handihaler 18 Mcg Caps (Tiotropium Bromide Monohydrate) .... Uad 14)  Allergy Relief 10 Mg Tabs (Loratadine) .... Once A Day 15)  Prednisone 10 Mg Tabs (Prednisone) .... Take 4 Tabs  Daily With Food X 4 Days, Then 3 Tabs Daily X 4 Days, Then 2 Tabs Daily X 4 Days, Then 1 Tab Daily X4 Days Then Stop. #40 16)  Doxycycline Hyclate 100 Mg Caps (Doxycycline Hyclate) .... Once Daily  Current Medications (  verified): 1)  Furosemide 20 Mg Tabs (Furosemide) .... Take One Tablet About Every Other Day 2)  Fluoxetine Hcl 20 Mg Caps (Fluoxetine Hcl) .... Take 1 Capsule By Mouth Once A Day 3)  Vytorin 10-40 Mg Tabs (Ezetimibe-Simvastatin) .... Take One Tablet By Mouth Dailyat Bedtime 4)  Klor-Con M10 10 Meq Cr-Tabs (Potassium Chloride Crys Cr) .... Take 1 Tablet By Mouth Once A Day 5)  Ranitidine Hcl 150 Mg Caps (Ranitidine Hcl) .... Take 1 Capsule By Mouth Two Times A Day 6)  Isosorbide Mononitrate Cr 30 Mg Xr24h-Tab (Isosorbide Mononitrate) .... Take One Tablet By Mouth As Needed 7)  Aspirin 81 Mg Tbec (Aspirin) .... Take One  Tablet By Mouth Daily 8)  Metformin Hcl 1000 Mg Tabs (Metformin Hcl) .... Take 1 Tablet By Mouth Two Times A Day 9)  Amlodipine Besylate 5 Mg Tabs (Amlodipine Besylate) .... Take One Tablet By Mouth Daily 10)  Advair Diskus 500-50 Mcg/dose Misc (Fluticasone-Salmeterol) .... One Puff Two Times A Day 11)  Atenolol 25 Mg Tabs (Atenolol) .... Take 1/2 Tablet Two Times A Day 12)  Duoneb 0.5-2.5 (3) Mg/65ml Soln (Ipratropium-Albuterol) .... As Needed 13)  Spiriva Handihaler 18 Mcg Caps (Tiotropium Bromide Monohydrate) .... Uad 14)  Allergy Relief 10 Mg Tabs (Loratadine) .... Once A Day 15)  Prednisone 10 Mg Tabs (Prednisone) .... Take 4 Tabs  Daily With Food X 4 Days, Then 3 Tabs Daily X 4 Days, Then 2 Tabs Daily X 4 Days, Then 1 Tab Daily X4 Days Then Stop. #40 16)  Doxycycline Hyclate 100 Mg Caps (Doxycycline Hyclate) .... Once Daily 17)  Mucinex Dm Maximum Strength 60-1200 Mg Xr12h-Tab (Dextromethorphan-Guaifenesin) .... Take One By Mouth Two Times A Day As Needed  Allergies (verified): 1)  ! Morphine 2)  ! Plavix (Clopidogrel Bisulfate)  Comments:  Nurse/Medical Assistant: The patient's medications and allergies were reviewed with the patient and were updated in the Medication and Allergy Lists.  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   Family History: Last updated: 03/09/2010 Family History MI/Heart Attack-father and mother Family History Breast Cancer- mother  Social History: Last updated: 07/21/2010 Single, lives in [place],  nonsmoker Patient states former smoker. since 2001 retired  Risk Factors: Smoking Status: quit (07/14/2010) Packs/Day: 1.5 (07/14/2010)  Social History: Single, lives in [place], nonsmoker Patient states former smoker. since 2001 retired  Review of Systems      See HPI  Vital Signs:  Patient profile:   70 year old female Weight:      194.13 pounds O2 Sat:      97 % on Room air Temp:     97.4 degrees F oral Pulse rate:   82 / minute BP sitting:   134 / 70  (left arm) Cuff size:   regular  Vitals Entered By: Boone Master CNA/MA (July 21, 2010 10:19 AM)  O2 Flow:  Room air  Physical Exam  Additional Exam:  Gen. Pleasant, well-nourished, in no distress, normal affect ENT - no lesions, no post nasal drip, upper airway  pseudo wheeze which improves on calming down Neck: No JVD, no thyromegaly, no carotid bruits Lungs: coarse BS w/ faint wheeze.  and upper airway psuedowheeze  Cardiovascular: Rhythm regular, heart sounds  normal, no murmurs or gallops, no peripheral edema Musculoskeletal: No deformities, no cyanosis or clubbing      Impression & Recommendations:  Problem # 1:  C O P D WITH ACUTE EXACERBATION (ICD-491.21)  Slolw to resolve flare  Plan:  Slow Prednisone taper as follows.  Prednisone 10mg  3  tabs x 5 days .  2 tabs once daily x 5 days  1 tabs once daily x 5 days.  Mucinex DM two times a day for cough Tessalon three times a day three times a day  Hydromet 1-2 tsp every 4-6 hr as needed if still coughing.  GOAL IS TO STOP COUGHING AND CLEARING THROAT.  Use sugarless candy , ice chips, water to help stop cough/throat clearing-NO MINTS.  Add Dexilant 60mg  in am, use randitidine at bedtime  Change loratadine to allegra once daily  Try to take slow breaths, brush/rinse/gargle after inhlaer use.  follow up 10 days NP Parrett.   Orders: Nebulizer Tx ([address]109) Est. Patient Level IV (60454)  Medications Added to Medication List This Visit: 1)   Mucinex Dm Maximum Strength 60-1200 Mg Xr12h-tab (Dextromethorphan-guaifenesin) .... Take one by mouth two times a day as needed 2)  Fexofenadine Hcl 180 Mg Tabs (Fexofenadine hcl) .Marland Kitchen.. 1 by mouth once daily as needed allergies 3)  Prednisone 10 Mg Tabs (Prednisone) .... Take as directed. 4)  Benzonatate 200 Mg Caps (Benzonatate) .Marland Kitchen.. 1 by mouth three times a day as needed cough 5)  Hydromet 5-1.5 Mg/79ml Syrp (Hydrocodone-homatropine) .Marland Kitchen.. 1-2 tsp every 4-6 hr as needed cough  Patient Instructions: 1)  Slow Prednisone taper as follows.  2)  Prednisone 10mg  3  tabs x 5 days .  3)  2 tabs once daily x 5 days  4)  1 tabs once daily x 5 days.  5)  Mucinex DM two times a day for cough 6)  Tessalon three times a day three times a day  7)  Hydromet 1-2 tsp every 4-6 hr as needed if still coughing.  8)  GOAL IS TO STOP COUGHING AND CLEARING THROAT.  9)  Use sugarless candy , ice chips, water to help stop cough/throat clearing-NO MINTS.  10)  Add Dexilant 60mg  in am, use randitidine at bedtime  11)  Change loratadine to allegra once daily  12)  Try to take slow breaths, brush/rinse/gargle after inhlaer use.  13)  follow up 10 days NP Parrett.  Prescriptions: HYDROMET 5-1.5 MG/5ML SYRP (HYDROCODONE-HOMATROPINE) 1-2 tsp every 4-6 hr as needed cough  #8 oz x 0   Entered and Authorized by:   Rubye Oaks NP   Signed by:   Tammy Parrett NP on 07/21/2010   Method used:   Print then Give to Patient   RxID:   [id number] BENZONATATE 200 MG CAPS (BENZONATATE) 1 by mouth three times a day as needed cough  #45 x 1   Entered and Authorized by:   Rubye Oaks NP   Signed by:   Tammy Parrett NP on 07/21/2010   Method used:   Electronically to        [facility] [id number] (retail)       [address]       [place], [place]  [address]       Ph: [phone]       Fax: [phone]   RxID:   [id number] PREDNISONE 10 MG TABS (PREDNISONE) take as directed.  #20 x  0   Entered and Authorized by:    Rubye Oaks NP   Signed by:   Tammy Parrett NP on 07/21/2010   Method used:   Electronically to        [facility] [address] (retail)       [address]       [place], [place]  [address]       Ph: [phone]       Fax: [phone]   RxID:   [id number] FEXOFENADINE HCL 180 MG TABS (FEXOFENADINE HCL) 1 by mouth once daily as needed allergies  #30 x 6   Entered and Authorized by:   Rubye Oaks NP   Signed by:   Rubye Oaks NP on 07/21/2010   Method used:   Electronically to        [facility] [address] (retail)       [address]       [place], [place]  [address]       Ph: [phone]       Fax: [phone]   RxID:   [id number]

## 2010-12-01 NOTE — Miscellaneous (Signed)
  Clinical Lists Changes  Observations: Added new observation of CARDCATHFIND: IMPRESSION:  The patient's chest pain would appear to be noncardiac. She has no flow-limiting lesions in her coronary.  She tolerated the procedure well.  At the end of the case, a TR band was applied at the right wrist with good hemostasis.     (10/15/2010 9:45) Added new observation of ECHOINTERP:  - Left ventricle: The cavity size was normal. Wall thickness was     increased in a pattern of mild LVH. Systolic function was normal.     The estimated ejection fraction was in the range of 60% to 65%.     Wall motion was normal; there were no regional wall motion     abnormalities. Doppler parameters are consistent with abnormal     left ventricular relaxation (grade 1 diastolic dysfunction).   - Aortic valve: There was no stenosis. Mild regurgitation.   - Mitral valve: Mildly calcified annulus. No significant     regurgitation.   - Left atrium: The atrium was mildly dilated.   - Pulmonary arteries: No TR doppler jet so unable to estimate PA     systolic pressure.   - Inferior vena cava: The vessel was normal in size; the     respirophasic diameter changes were in the normal range (= 50%);     findings are consistent with normal central venous pressure.   Impressions:    - Normal LV size with mild LV hypertrophy, EF 60-65%. Normal RV size     and systolic function. Mild aortic insufficiency. (10/11/2010 9:46)      Cardiac Cath  Procedure date:  10/15/2010  Findings:      IMPRESSION:  The patient's chest pain would appear to be noncardiac. She has no flow-limiting lesions in her coronary.  She tolerated the procedure well.  At the end of the case, a TR band was applied at the right wrist with good hemostasis.      Echocardiogram  Procedure date:  10/11/2010  Findings:       - Left ventricle: The cavity size was normal. Wall thickness was     increased in a pattern of mild LVH. Systolic  function was normal.     The estimated ejection fraction was in the range of 60% to 65%.     Wall motion was normal; there were no regional wall motion     abnormalities. Doppler parameters are consistent with abnormal     left ventricular relaxation (grade 1 diastolic dysfunction).   - Aortic valve: There was no stenosis. Mild regurgitation.   - Mitral valve: Mildly calcified annulus. No significant     regurgitation.   - Left atrium: The atrium was mildly dilated.   - Pulmonary arteries: No TR doppler jet so unable to estimate PA     systolic pressure.   - Inferior vena cava: The vessel was normal in size; the     respirophasic diameter changes were in the normal range (= 50%);     findings are consistent with normal central venous pressure.   Impressions:    - Normal LV size with mild LV hypertrophy, EF 60-65%. Normal RV size     and systolic function. Mild aortic insufficiency.

## 2010-12-01 NOTE — Medication Information (Signed)
Summary: Medication Therapy Mgmt/[employer]  Medication Therapy Mgmt/[employer]   Imported By: Sherian Rein 11/25/2010 13:50:42  _____________________________________________________________________  External Attachment:    Type:   Image     Comment:   External Document

## 2010-12-01 NOTE — Progress Notes (Signed)
Summary: c/o chestpain  Phone Note Call from Patient   Caller: Gennaro Africa- [phone]-/ mom # [phone] Reason for Call: Talk to Nurse Complaint: Chest Pain Summary of Call: per pt son called this am. mom c/o chestpain. pt took 2 nitro around 1:30  this am.  Initial call taken by: Lorne Skeens,  October 10, 2010 9:27 AM  Follow-up for Phone Call        10/10/10--0930am--pt calling stating has had episodes of CP since 130am--pain lasts about 10 min and is relieved by s.l. NTG--pt has taken 2 nitros--also c/o pain radiating to jaw--son is on way to pic her up--advised to continue with NTG as needed--please go to nearest hospital--pt is coming to [facility]--trish notified and message faxed to [place] Follow-up by: Ledon Snare, RN,  October 10, 2010 9:39 AM

## 2010-12-01 NOTE — Assessment & Plan Note (Signed)
Summary: F/U COPD ACUTE EXACERBATION/[facility]   Visit Type:  Follow-up Copy to:  Dr. Riley Kill Primary Samhita Kretsch/Referring Najeeb Uptain:  Reather Littler, FNP   CC:  Pt states breathing is better continues to cough intermittent productive with thick white mucus.  History of Present Illness: 69/F, ex- smoker, quit 2001 for FU of COPD/asthma  . used to see Dr Marcos Eke, last saw MW in 4/08 -enalapril dc'd. 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 Triggers being activity, perfumes, spring allergies (claritin has helped this year) , cold weather etc. Cardiac evaluation by Dr Riley Kill noted. CXR 9/10 >> no acute cardiopulm process. Spirometry >> FEv1 48 % c/w severe airway obstruction. upper airway  pseudo wheeze which improves on calming down Son (EMT) reports severe anxiety, now off ativan for unclear reason. Added  spiriva june '11 to improve exercise tolerance - good results  July 14, 2010 4:02 PM  c/o cough, wheezing, increased SOB. STarted in july'11 -Pt was treated with Prednisone taperand abx x 2 - improved then worse again since  last week. woresning cough & bspasm in office requiring neb & solumedrol IM . CXR clear     August 26, 2010 11:34 AM  2 interim visits for persistent cough & bronchospasm - improves with steroids, but starts back after taper. Dexilant helped somewhat - denies heartburn. Has a bird- cockatoo, clean cage. Using duonebs once daily , ON atenolol since 2001, WC 14k  September 21, 2010 10:45 AM  Saw PMD & got more prednisone 50 mg down to 20 mg now, was doing 'remarkably' per son until yesterday - worsening wheeze - Audible pseudowheeze that improves on pursed lip breathing & calming down, white mucoid phlegm. back on ativan 1mg  , compliant with advair, spiriva & albuterol RAST - mildly pos for cat & dog dander >> solumedrol IM & pred 40 mg -slow taper Confused between dexilant, ranitidine & prilosec Denies chest pain,  orthopnea, hemoptysis, fever, n/v/d, edema, headache.     October 07, 2010 5:11 PM  better per pt & son, down to 30 mg , felt chest tightness on decreasing dose, Sliding scale did not work, now on lantus at bedtime  -with dose titration, Denies heartburn. c/o easy bruising over forearms  Current Medications (verified): 1)  Furosemide 20 Mg Tabs (Furosemide) .... Take One Tablet About Every Other Day 2)  Fluoxetine Hcl 20 Mg Caps (Fluoxetine Hcl) .... Take 1 Capsule By Mouth Once A Day 3)  Vytorin 10-40 Mg Tabs (Ezetimibe-Simvastatin) .... Take One Tablet By Mouth Dailyat Bedtime 4)  Klor-Con M10 10 Meq Cr-Tabs (Potassium Chloride Crys Cr) .... Take 1 Tablet By Mouth Once A Day 5)  Ranitidine Hcl 150 Mg Caps (Ranitidine Hcl) .... Take 1 Capsule By Mouth Two Times A Day 6)  Isosorbide Mononitrate Cr 30 Mg Xr24h-Tab (Isosorbide Mononitrate) .... Take One Tablet By Mouth As Needed 7)  Aspirin 81 Mg Tbec (Aspirin) .... Take One Tablet By Mouth Daily 8)  Metformin Hcl 1000 Mg Tabs (Metformin Hcl) .... Take 1 Tablet By Mouth Two Times A Day 9)  Amlodipine Besylate 5 Mg Tabs (Amlodipine Besylate) .... Take One Tablet By Mouth Daily 10)  Advair Diskus 500-50 Mcg/dose Misc (Fluticasone-Salmeterol) .... One Puff Two Times A Day 11)  Atenolol 25 Mg Tabs (Atenolol) .... Take 1/2 Tablet Two Times A Day 12)  Duoneb 0.5-2.5 (3) Mg/88ml Soln (Ipratropium-Albuterol) .... Four Times A Day As Needed 13)  Spiriva Handihaler 18 Mcg Caps (Tiotropium Bromide Monohydrate) .... Once Daily 14)  Allergy  Relief 10 Mg Tabs (Loratadine) .... Once A Day 15)  Mucinex Dm Maximum Strength 60-1200 Mg Xr12h-Tab (Dextromethorphan-Guaifenesin) .... Take One By Mouth Two Times A Day As Needed 16)  Fexofenadine Hcl 180 Mg Tabs (Fexofenadine Hcl) .Marland Kitchen.. 1 By Mouth Once Daily As Needed Allergies 17)  Prednisone 10 Mg Tabs (Prednisone) .... Take 3 Tablet By Mouth Once A Day 18)  Ativan 1 Mg Tabs (Lorazepam) .... Take 1 Tab By Mouth At Bedtime 19)  Lantus Solostar 100 Unit/ml Soln (Insulin  Glargine) .Marland Kitchen.. 10 Units At Bedtime  Allergies (verified): 1)  ! Morphine 2)  ! Plavix (Clopidogrel Bisulfate) 3)  ! 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 since 2001 retired  Review of Systems       The patient complains of dyspnea on exertion.  The patient denies anorexia, fever, weight loss, weight gain, vision loss, decreased hearing, hoarseness, chest pain, syncope, peripheral edema, prolonged cough, headaches, hemoptysis, abdominal pain, melena, hematochezia, severe indigestion/heartburn, hematuria, muscle weakness, suspicious skin lesions, transient blindness, difficulty walking, depression, unusual weight change, abnormal bleeding, enlarged lymph nodes, and angioedema.    Vital Signs:  Patient profile:   70 year old female Height:      63 inches Weight:      199.8 pounds BMI:     35.52 O2 Sat:      95 % on Room air Temp:     98.3 degrees F oral Pulse rate:   97 / minute BP sitting:   140 / 70  (left arm) Cuff size:   large  Vitals Entered By: Zackery Barefoot CMA (October 07, 2010 4:38 PM)  O2 Flow:  Room air  Physical Exam  Additional Exam:  wt 197 August 26, 2010  wt 200 October 07, 2010  Gen. Pleasant, well-nourished, in no distress, normal affect ENT - no lesions, clear nasal mucus Neck: No JVD, no thyromegaly, no carotid bruits Lungs: faint rhonchi , audible upper airway pseudowheeze Cardiovascular: Rhythm regular, heart sounds  normal, no murmurs or gallops, no peripheral  edema Musculoskeletal: No deformities, no cyanosis or clubbing Skin- no lesions     Impression & Recommendations:  Problem # 1:  C O P D WITH ACUTE EXACERBATION (ICD-491.21)  Taper slowly by 5 mg per week - reassess at 20 mg stay ona dvair/ spiriva  Orders: Est. Patient Level IV (16109)  Problem # 2:  G E REFLUX (ICD-530.81)  Dexilant  - instead of ranitidine The following medications were removed from the medication list:    Ranitidine Hcl 150 Mg Caps (Ranitidine hcl) .Marland Kitchen... Take 1 capsule by mouth two times a day Her updated medication list for this problem includes:    Dexilant 60 Mg Cpdr (Dexlansoprazole) ..... Once daily  Orders: Est. Patient Level IV (60454)  Problem # 3:  CAD, NATIVE VESSEL (ICD-414.01)  dc atenolol & add bystolic - due to persistent , non resolving bronchospasm The following medications were removed from the medication list:    Atenolol 25 Mg Tabs (Atenolol) .Marland Kitchen... Take 1/2 tablet two times a day Her updated medication list for this problem includes:    Furosemide  20 Mg Tabs (Furosemide) .Marland Kitchen... Take one tablet about every other day    Isosorbide Mononitrate Cr 30 Mg Xr24h-tab (Isosorbide mononitrate) .Marland Kitchen... Take one tablet by mouth as needed    Aspirin 81 Mg Tbec (Aspirin) .Marland Kitchen... Take one tablet by mouth daily    Amlodipine Besylate 5 Mg Tabs (Amlodipine besylate) .Marland Kitchen... Take one tablet by mouth daily    Bystolic 5 Mg Tabs (Nebivolol hcl) ..... Once daily  Orders: Est. Patient Level IV (78469)  Medications Added to Medication List This Visit: 1)  Prednisone 10 Mg Tabs (Prednisone) .... Take 3 tablet by mouth once a day 2)  Lantus Solostar 100 Unit/ml Soln (Insulin glargine) .Marland Kitchen.. 10 units at bedtime 3)  Bystolic 5 Mg Tabs (Nebivolol hcl) .... Once daily 4)  Dexilant 60 Mg Cpdr (Dexlansoprazole) .... Once daily  Patient Instructions: 1)  Copy sent to: Reather Littler NP 2)  Please schedule a follow-up appointment in 2 weeks with TP 3)  Decrease  prednisone  4)  2 1/2 tabs on Monday dec 12th 5)  2 tabs on monday dec 19th  6)  increase lantus to get sugars < 200 range 7)  Use dexilant samples instead of ranitidine 8)  STOP atenolol. Take Bystolic instead

## 2010-12-01 NOTE — Assessment & Plan Note (Signed)
Summary: NP follow up - post hosp    Copy to:  Dr. Riley Kill Primary Rheanna Sergent/Referring Amariyon Maynes:  Reather Littler, FNP   CC:  2 week follow up and post hosp - states breathing has improved since hosp discharge.  History of Present Illness: 70/F, ex- smoker, quit 2001 for FU of COPD/asthma  .  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 Triggers being activity, perfumes, spring allergies (claritin has helped this year) , cold weather etc.  Spirometry >> FEv1 48 % c/w severe airway obstruction. upper airway  pseudo wheeze which improves on calming down Son (EMT) reports severe anxiety, now off ativan for unclear reason. Added  spiriva june '11 to improve exercise tolerance - good results  July 14, 2010 4:02 PM  c/o cough, wheezing, increased SOB. Since  july'11 -Pt was treated with Prednisone taper and abx x 2 - improved then worse again since  last week. woresning cough & bspasm in office requiring neb & solumedrol IM . CXR clear. Has a bird- cockatoo, clean cage. ON atenolol since 2001, WC 14k  September 21, 2010 10:45 AM  Saw PMD & got more prednisone 50 mg down to 20 mg now, was doing 'remarkably' per son until yesterday - worsening wheeze - Audible pseudowheeze that improves on pursed lip breathing & calming down, white mucoid phlegm. back on ativan 1mg  , compliant with advair, spiriva & albuterol RAST - mildly pos for cat & dog dander >> solumedrol IM & pred 40 mg -slow taper Confused between dexilant, ranitidine & prilosec  October 07, 2010 5:11 PM  better per pt & son, down to 30 mg , felt chest tightness on decreasing dose, Sliding scale did not work, now on lantus at bedtime  -with dose titration. c/o easy bruising over forearms >> gradual taper by 5 mg every week planned  October 12, 2010 1:20 PM [facility] consult Adm for chest tightness radiating to jaw, enzymes neg, CXR neg, No orthopnea/ PND Iv solumedrol started for bronchospasm Sugars high     10/26/10--Presents for 2 week  follow up and post hosp - states breathing has improved since hosp discharge. Pt was admitted 12/12-12/17/11 for COPD exacerbation and atypical chest pain w/ neg enzymes and s/p  cardiac catheterization on October 14, 2010, showing no flow-limiting lesions in her coronary arteries. Pulmonary was consulted for COPD exacerbation . Tx w/ steroids, pulmonary hygiene, lopressor was changed to bystolic and Advair was changed to pulmicort and spiriva. Discharged on steroid taper.    Medications Prior to Update: 1)  Furosemide 20 Mg Tabs (Furosemide) .... Take One Tablet About Every Other Day 2)  Fluoxetine Hcl 20 Mg Caps (Fluoxetine Hcl) .... Take 1 Capsule By Mouth Once A Day 3)  Vytorin 10-40 Mg Tabs (Ezetimibe-Simvastatin) .... Take One Tablet By Mouth Dailyat Bedtime 4)  Klor-Con M10 10 Meq Cr-Tabs (Potassium Chloride Crys Cr) .... Take 1 Tablet By Mouth Once A Day 5)  Isosorbide Mononitrate Cr 30 Mg Xr24h-Tab (Isosorbide Mononitrate) .... Take One Tablet By Mouth As Needed 6)  Aspirin 81 Mg Tbec (Aspirin) .... Take One Tablet By Mouth Daily 7)  Metformin Hcl 1000 Mg Tabs (Metformin Hcl) .... Take 1 Tablet By Mouth Two Times A Day 8)  Amlodipine Besylate 5 Mg Tabs (Amlodipine Besylate) .... Take One Tablet By Mouth Daily 9)  Advair Diskus 500-50 Mcg/dose Misc (Fluticasone-Salmeterol) .... One Puff Two Times A Day 10)  Duoneb 0.5-2.5 (3) Mg/62ml Soln (Ipratropium-Albuterol) .... Four Times A Day As Needed 11)  Spiriva Handihaler 18 Mcg Caps (Tiotropium Bromide Monohydrate) .... Once Daily 12)  Allergy Relief 10 Mg Tabs (Loratadine) .... Once A Day 13)  Mucinex Dm Maximum Strength 60-1200 Mg Xr12h-Tab (Dextromethorphan-Guaifenesin) .... Take One By Mouth Two Times A Day As Needed 14)  Fexofenadine Hcl 180 Mg Tabs (Fexofenadine Hcl) .Marland Kitchen.. 1 By Mouth Once Daily As Needed Allergies 15)  Prednisone 10 Mg Tabs (Prednisone) .... Take 3 Tablet By Mouth Once A Day 16)  Ativan 1 Mg Tabs (Lorazepam) .... Take 1  Tab By Mouth At Bedtime 17)  Lantus Solostar 100 Unit/ml Soln (Insulin Glargine) .Marland Kitchen.. 10 Units At Bedtime 18)  Bystolic 5 Mg Tabs (Nebivolol Hcl) .... Once Daily 19)  Dexilant 60 Mg Cpdr (Dexlansoprazole) .... Once Daily  Current Medications (verified): 1)  Furosemide 20 Mg Tabs (Furosemide) .Marland Kitchen.. 1-2 Tabs By Mouth Once Daily 2)  Fluoxetine Hcl 20 Mg Caps (Fluoxetine Hcl) .... Take 1 Capsule By Mouth Two Times A Day 3)  Vytorin 10-40 Mg Tabs (Ezetimibe-Simvastatin) .... Take One Tablet By Mouth Dailyat Bedtime 4)  Klor-Con M20 20 Meq Cr-Tabs (Potassium Chloride Crys Cr) .... Take 1 Tablet By Mouth Once A Day 5)  Isosorbide Mononitrate Cr 30 Mg Xr24h-Tab (Isosorbide Mononitrate) .... Take One Tablet By Mouth As Needed 6)  Aspirin 81 Mg Tbec (Aspirin) .... Take One Tablet By Mouth Daily 7)  Metformin Hcl 1000 Mg Tabs (Metformin Hcl) .... Take 1 Tablet By Mouth Two Times A Day 8)  Amlodipine Besylate 5 Mg Tabs (Amlodipine Besylate) .... Take One Tablet By Mouth Daily 9)  Spiriva Handihaler 18 Mcg Caps (Tiotropium Bromide Monohydrate) .... Once Daily 10)  Allergy Relief 10 Mg Tabs (Loratadine) .... Once A Day 11)  Mucinex Dm Maximum Strength 60-1200 Mg Xr12h-Tab (Dextromethorphan-Guaifenesin) .... Take One By Mouth Two Times A Day As Needed 12)  Fexofenadine Hcl 180 Mg Tabs (Fexofenadine Hcl) .Marland Kitchen.. 1 By Mouth Once Daily As Needed Allergies 13)  Prednisone 10 Mg Tabs (Prednisone) .... Take 3 Tablet By Mouth Once A Day 14)  Ativan 1 Mg Tabs (Lorazepam) .Marland Kitchen.. 1-2 Tabs By Mouth Two Times A Day As Needed Anxiety 15)  Lantus Solostar 100 Unit/ml Soln (Insulin Glargine) .Marland Kitchen.. 14 Units At Bedtime 16)  Bystolic 5 Mg Tabs (Nebivolol Hcl) .... Once Daily 17)  Dexilant 60 Mg Cpdr (Dexlansoprazole) .... Once Daily 18)  Zyrtec Allergy 10 Mg Tabs (Cetirizine Hcl) .... Take 1 Tablet By Mouth Every Morning 19)  Humalog Kwikpen 100 Unit/ml Soln (Insulin Lispro (Human)) .... Sliding Scale With Each Meal 20)  Pulmicort  0.25 Mg/9ml Susp (Budesonide) .... Inhale 1 Vial in Hhn Two Times A Day 21)  Albuterol Sulfate (2.5 Mg/25ml) 0.083% Nebu (Albuterol Sulfate) .... Inhale 1 Vial in Hhn Every 4-6 Hours As Needed 22)  Amlodipine Besylate 5 Mg Tabs (Amlodipine Besylate) .... Take 1 Tablet By Mouth Once A Day  Allergies (verified): 1)  ! Morphine 2)  ! Plavix (Clopidogrel Bisulfate) 3)  ! 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   Family History: Last updated: 03/09/2010 Family History MI/Heart Attack-father and mother Family History Breast Cancer- mother  Social History: Last updated: 07/21/2010 Single,  lives in [place], nonsmoker Patient states former smoker. since 2001 retired  Risk Factors: Smoking Status: quit (09/21/2010) Packs/Day: 1.5 (09/21/2010)  Review of Systems      See HPI  Vital Signs:  Patient profile:   70 year old female Height:      63 inches Weight:      205.38 pounds BMI:     36.51 O2 Sat:      95 % on Room air Temp:     97.9 degrees F oral Pulse rate:   113 / minute BP sitting:   150 / 86  (left arm) Cuff size:   large  Vitals Entered By: Boone Master CNA/MA (October 26, 2010 10:54 AM)  O2 Flow:  Room air CC: 2 week follow up and post hosp - states breathing has improved since hosp discharge Is Patient Diabetic? Yes Comments Medications reviewed with patient Daytime contact number verified with patient. Boone Master CNA/MA  October 26, 2010 10:54 AM    Physical Exam  Additional Exam:  wt  197 August 26, 2010  wt 200 October 07, 2010 >205  10/26/10 Gen. Pleasant, well-nourished, in no distress, normal affect ENT - no lesions, clear nasal mucus Neck: No JVD, no thyromegaly, no carotid bruits Lungs: coarse BS w/ no wheezing  Cardiovascular: Rhythm regular, heart sounds  normal, no murmurs or gallops, no peripheral edema Musculoskeletal: No deformities, no cyanosis or clubbing Skin- no lesions     Impression & Recommendations:  Problem # 1:  C O P D WITH ACUTE EXACERBATION (ICD-491.21)  recent hospitalization w/ atypical chest pain r/o for MI w/ neg cardiac enzymes and cardiac cath showing no blockages.  Improved w/ steroid burst and med changes --lopressor to bystolic and advair to pulmicort neb.  Plan:  will taper steroids slowly Begin Prednisone 25mg  once daily on 10/30/10 --Sunday Begin 20mg  once daily on 11/06/10 and hold at this dose.  follow up Dr. Vassie Loll in 2-3 weeks and as needed  Please contact office for sooner follow up if symptoms do not improve or worsen   Orders: Est. Patient Level IV (19147)  Medications Added to Medication List This Visit: 1)  Furosemide 20 Mg Tabs (Furosemide) .Marland Kitchen.. 1-2 tabs by mouth once daily 2)  Fluoxetine Hcl 20 Mg Caps (Fluoxetine hcl) .... Take 1 capsule by mouth two times a day 3)  Klor-con M20 20 Meq Cr-tabs (Potassium chloride crys cr) .... Take 1 tablet by mouth once a day 4)  Ativan 1 Mg Tabs (Lorazepam) .Marland Kitchen.. 1-2 tabs by mouth two times a day as needed anxiety 5)  Lantus Solostar 100 Unit/ml Soln (Insulin glargine) .Marland Kitchen.. 14 units at bedtime 6)  Zyrtec Allergy 10 Mg Tabs (Cetirizine hcl) .... Take 1 tablet by mouth every morning 7)  Humalog Kwikpen 100 Unit/ml Soln (Insulin lispro (human)) .... Sliding scale with each meal 8)  Pulmicort 0.25 Mg/8ml Susp (Budesonide) .... Inhale 1 vial in hhn two times a day 9)  Albuterol Sulfate (2.5 Mg/71ml) 0.083% Nebu (Albuterol sulfate) .... Inhale 1 vial in hhn every 4-6 hours as needed 10)   Amlodipine Besylate 5 Mg Tabs (Amlodipine besylate) .... Take 1 tablet by mouth once a day  Complete Medication List: 1)  Furosemide 20 Mg Tabs (Furosemide) .Marland Kitchen.. 1-2 tabs by mouth once daily 2)  Fluoxetine Hcl 20 Mg Caps (Fluoxetine hcl) .... Take 1 capsule by mouth two times a day 3)  Vytorin 10-40 Mg Tabs (Ezetimibe-simvastatin) .... Take one tablet by mouth dailyat bedtime 4)  Klor-con M20 20 Meq Cr-tabs (Potassium chloride crys cr) .... Take 1 tablet by mouth once a day 5)  Isosorbide Mononitrate Cr 30 Mg Xr24h-tab (Isosorbide mononitrate) .... Take one tablet by mouth as needed 6)  Aspirin 81 Mg Tbec (Aspirin) .... Take one tablet by mouth daily 7)  Metformin Hcl 1000 Mg Tabs (Metformin hcl) .... Take 1 tablet by mouth two times a day 8)  Amlodipine Besylate 5 Mg Tabs (Amlodipine besylate) .... Take one tablet by mouth daily 9)  Spiriva Handihaler 18 Mcg Caps (Tiotropium bromide monohydrate) .... Once daily 10)  Allergy Relief 10 Mg Tabs (Loratadine) .... Once a day 11)  Mucinex Dm Maximum Strength 60-1200 Mg Xr12h-tab (Dextromethorphan-guaifenesin) .... Take one by mouth two times a day as needed 12)  Fexofenadine Hcl 180 Mg Tabs (Fexofenadine hcl) .Marland Kitchen.. 1 by mouth once daily as needed allergies 13)  Prednisone 10 Mg Tabs (Prednisone) .... Take 3 tablet by mouth once a day 14)  Ativan 1 Mg Tabs (Lorazepam) .Marland Kitchen.. 1-2 tabs by mouth two times a day as needed anxiety 15)  Lantus Solostar 100 Unit/ml Soln (Insulin glargine) .Marland Kitchen.. 14 units at bedtime 16)  Bystolic 5 Mg Tabs (Nebivolol hcl) .... Once daily 17)  Dexilant 60 Mg Cpdr (Dexlansoprazole) .... Once daily 18)  Zyrtec Allergy 10 Mg Tabs (Cetirizine hcl) .... Take 1 tablet by mouth every morning 19)  Humalog Kwikpen 100 Unit/ml Soln (Insulin lispro (human)) .... Sliding scale with each meal 20)  Pulmicort 0.25 Mg/65ml Susp (Budesonide) .... Inhale 1 vial in hhn two times a day 21)  Albuterol Sulfate (2.5 Mg/66ml) 0.083% Nebu (Albuterol  sulfate) .... Inhale 1 vial in hhn every 4-6 hours as needed  Patient Instructions: 1)  Begin Prednisone 25mg  once daily on 10/30/10 --Sunday 2)  Begin 20mg once daily on 11/06/10 and hold at this dose.  3)  follow up Dr. Alva in 2-3 weeks and as needed  4)  Please contact office for sooner follow up if symptoms do not improve or worsen  Prescriptions: BYSTOLIC 5 MG TABS (NEBIVOLOL HCL) once daily  #30 x 5   Entered and Authorized by:   Tammy Parrett NP   Signed by:   Tammy Parrett NP on 10/26/2010   Method used:   Electronically to        [facility] [id number]* (retail)       [address] [place], [place]  [address]       Ph: [phone]       Fax: [phone]   RxID:   [id number]

## 2010-12-01 NOTE — Assessment & Plan Note (Signed)
Summary: hospital consult   Vital Signs:  Patient profile:   70 year old female O2 Sat:      97 % on Room air Pulse rate:   103 / minute Resp:     22 per minute BP sitting:   131 / 78  O2 Flow:  Room air  Copy to:  Dr. Riley Kill Primary Redding Cloe/Referring Archana Eckman:  Reather Littler, FNP    History of Present Illness: 69/F, ex- smoker, quit 2001 for FU of COPD/asthma  .  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 Triggers being activity, perfumes, spring allergies (claritin has helped this year) , cold weather etc.  Spirometry >> FEv1 48 % c/w severe airway obstruction. upper airway  pseudo wheeze which improves on calming down Son (EMT) reports severe anxiety, now off ativan for unclear reason. Added  spiriva june '11 to improve exercise tolerance - good results  July 14, 2010 4:02 PM  c/o cough, wheezing, increased SOB. Since  july'11 -Pt was treated with Prednisone taper and abx x 2 - improved then worse again since  last week. woresning cough & bspasm in office requiring neb & solumedrol IM . CXR clear. Has a bird- cockatoo, clean cage. ON atenolol since 2001, WC 14k  September 21, 2010 10:45 AM  Saw PMD & got more prednisone 50 mg down to 20 mg now, was doing 'remarkably' per son until yesterday - worsening wheeze - Audible pseudowheeze that improves on pursed lip breathing & calming down, white mucoid phlegm. back on ativan 1mg  , compliant with advair, spiriva & albuterol RAST - mildly pos for cat & dog dander >> solumedrol IM & pred 40 mg -slow taper Confused between dexilant, ranitidine & prilosec  October 07, 2010 5:11 PM  better per pt & son, down to 30 mg , felt chest tightness on decreasing dose, Sliding scale did not work, now on lantus at bedtime  -with dose titration. c/o easy bruising over forearms >> gradual taper by 5 mg every week planned  October 12, 2010 1:20 PM [facility] consult Adm for chest tightness radiating to jaw, enzymes neg, CXR neg, No orthopnea/ PND Iv  solumedrol started for bronchospasm Sugars high      Allergies: 1)  ! Morphine 2)  ! Plavix (Clopidogrel Bisulfate) 3)  ! 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   Family History: Last updated: 03/09/2010 Family History MI/Heart Attack-father and mother Family History Breast Cancer- mother  Social History: Last updated: 07/21/2010 Single, lives in [place], nonsmoker Patient states former smoker. since 2001 retired  Review of Systems       The patient complains of dyspnea on exertion.  The patient denies anorexia, fever, weight loss, weight gain, vision loss, decreased hearing, hoarseness, chest pain, syncope, peripheral edema, prolonged cough, headaches, hemoptysis, abdominal pain, melena, hematochezia, severe indigestion/heartburn, hematuria, muscle weakness, suspicious skin lesions, difficulty walking, depression, unusual weight change, abnormal bleeding, enlarged lymph nodes, and angioedema.    Physical Exam  Additional Exam:  wt 197 August 26, 2010  wt 200 October 07, 2010  Gen. Pleasant, well-nourished, in no distress, normal affect ENT - no lesions, clear  nasal mucus Neck: No JVD, no thyromegaly, no carotid bruits Lungs: faint rhonchi , audible upper airway pseudowheeze Cardiovascular: Rhythm regular, heart sounds  normal, no murmurs or gallops, no peripheral edema Musculoskeletal: No deformities, no cyanosis or clubbing Skin- no lesions     Impression & Recommendations:  Problem # 1:  C O P D  WITH ACUTE EXACERBATION (ICD-491.21) Iv solumedrol 40 q 6h Start pulmicort instead of advair  stay on spiriva Have been unable to pinpoint trigger - note interventions so far : -allergy testing -cardioselective beta blockade with bystolic  Problem # 2:  DIABETES MELLITUS, TYPE II (ICD-250.00) Increase lantus to 14 u while on steroids Her updated medication list for this problem includes:    Aspirin 81 Mg Tbec (Aspirin) .Marland Kitchen... Take one tablet by mouth daily    Metformin Hcl 1000 Mg Tabs (Metformin hcl) .Marland Kitchen... Take 1 tablet by mouth two times a day    Lantus Solostar 100 Unit/ml Soln (Insulin glargine) .Marland KitchenMarland KitchenMarland KitchenMarland Kitchen 10 units at bedtime  Problem # 3:  G E REFLUX (ICD-530.81) stay on PPI - protonix ok while in hospital Her updated medication list for this problem includes:    Dexilant 60 Mg Cpdr (Dexlansoprazole) ..... Once daily  Problem # 4:  CAD, NATIVE VESSEL (ICD-414.01) Defer to dr Riley Kill, but would defer cath until bspasm resolved Her updated medication list for this problem includes:    Furosemide 20 Mg Tabs (Furosemide) .Marland Kitchen... Take one tablet about every other day    Isosorbide Mononitrate Cr 30 Mg Xr24h-tab (Isosorbide mononitrate) .Marland Kitchen... Take one tablet by mouth as needed    Aspirin 81 Mg Tbec (Aspirin) .Marland Kitchen... Take one tablet by mouth daily    Amlodipine Besylate 5 Mg Tabs (Amlodipine besylate) .Marland Kitchen... Take one tablet by mouth daily    Bystolic 5 Mg Tabs (Nebivolol hcl) ..... Once daily

## 2010-12-01 NOTE — Assessment & Plan Note (Signed)
Summary: eph per night message/lg   Visit Type:  Follow-up Referring Provider:  Dr. Riley Kill Primary Provider:  Reather Littler, FNP   CC:  Shortness of breath, chest tightening when breathing at times, and feet swelling.  History of Present Illness: Primary Cardiologist:  Dr. Shawnie Pons  [name] Lane is a 70 yo female with a history of CAD, status post multiple PCI's in the past, diabetes, hypertension and hyperlipidemia.  She was recently admitted with chest pain in the setting of COPD exacerbation.  Cardiac catheterization demonstrated patent stents in the LAD and RCA with minimal nonobstructive disease elsewhere and an ejection fraction of 60%.  Her metoprolol was changed to Bystolic due to bronchospasm.  She was placed on prednisone for her COPD and her inhaled medications were adjusted.  She returns for followup.  Her prednisone dose has been gradually decreased.  Since she has gotten to a dose of 25 mg, she has noted increased cough and wheezing.  Her shortness of breath is also increased.  Since she has been on prednisone, she has noted increased pedal edema.  He sleeps on 2 pillows.  She denies syncope.  She continues to have chest tightness with her COPD.  She thinks that she is supposed to be taken off of Vytorin.  Indeed, she is on Vytorin plus amlodipine.  Anticoagulation Management History:      Positive risk factors for bleeding include an age of 61 years or older and presence of serious comorbidities.  The bleeding index is 'intermediate risk'.  Positive CHADS2 values include History of HTN and History of Diabetes.  Negative CHADS2 values include Age > 2 years old.  Her last INR was 0.9 ratio.     Current Medications (verified): 1)  Furosemide 20 Mg Tabs (Furosemide) .Marland Kitchen.. 1-2 Tabs By Mouth Once Daily 2)  Fluoxetine Hcl 20 Mg Caps (Fluoxetine Hcl) .... Take 1 Capsule By Mouth Two Times A Day 3)  Vytorin 10-40 Mg Tabs (Ezetimibe-Simvastatin) .... Take One Tablet By Mouth  Dailyat Bedtime 4)  Klor-Con M20 20 Meq Cr-Tabs (Potassium Chloride Crys Cr) .... Take 1 Tablet By Mouth Once A Day 5)  Isosorbide Mononitrate Cr 30 Mg Xr24h-Tab (Isosorbide Mononitrate) .... Take One Tablet By Mouth As Needed 6)  Aspirin 81 Mg Tbec (Aspirin) .... Take One Tablet By Mouth Daily 7)  Metformin Hcl 1000 Mg Tabs (Metformin Hcl) .... Take 1 Tablet By Mouth Two Times A Day 8)  Amlodipine Besylate 5 Mg Tabs (Amlodipine Besylate) .... Take One Tablet By Mouth Daily 9)  Spiriva Handihaler 18 Mcg Caps (Tiotropium Bromide Monohydrate) .... Once Daily 10)  Allergy Relief 10 Mg Tabs (Loratadine) .... Once A Day 11)  Mucinex Dm Maximum Strength 60-1200 Mg Xr12h-Tab (Dextromethorphan-Guaifenesin) .... Take One By Mouth Two Times A Day As Needed 12)  Fexofenadine Hcl 180 Mg Tabs (Fexofenadine Hcl) .Marland Kitchen.. 1 By Mouth Once Daily As Needed Allergies 13)  Prednisone 10 Mg Tabs (Prednisone) .... Take 3 Tablet By Mouth Once A Day 14)  Ativan 1 Mg Tabs (Lorazepam) .Marland Kitchen.. 1-2 Tabs By Mouth Two Times A Day As Needed Anxiety 15)  Lantus Solostar 100 Unit/ml Soln (Insulin Glargine) .Marland Kitchen.. 14 Units At Bedtime 16)  Bystolic 5 Mg Tabs (Nebivolol Hcl) .... Once Daily 17)  Dexilant 60 Mg Cpdr (Dexlansoprazole) .... Once Daily 18)  Zyrtec Allergy 10 Mg Tabs (Cetirizine Hcl) .... Take 1 Tablet By Mouth Every Morning 19)  Humalog Kwikpen 100 Unit/ml Soln (Insulin Lispro (Human)) .... Sliding Scale With Each Meal  20)  Pulmicort 0.25 Mg/58ml Susp (Budesonide) .... Inhale 1 Vial in Hhn Two Times A Day 21)  Albuterol Sulfate (2.5 Mg/67ml) 0.083% Nebu (Albuterol Sulfate) .... Inhale 1 Vial in Hhn Every 4-6 Hours As Needed  Allergies (verified): 1)  ! Morphine 2)  ! Plavix (Clopidogrel Bisulfate) 3)  ! * Benzonatate  Past History:  Past Medical History: Current Problems:  CAD, NATIVE VESSEL (ICD-414.01)   a.  s/p multiple stents in past   b.  cath 10/15/2010: LAD stent ok; D1 30%; RCA 40% after stent, RCA stents  patent; EF 60% Echo 09/2010: mild LVH; EF 55-65%; Gr. 1 Diast. Dysfxn; mild AI; pulmo. pressures were not assessed (no pulm. 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) C O P D (ICD-496) ANXIETY STATE, UNSPECIFIED (ICD-300.00) DIABETES MELLITUS, TYPE II (ICD-250.00) ASTHMA, UNSPECIFIED, UNSPECIFIED STATUS (ICD-493.90)    Vital Signs:  Patient profile:   70 year old female Height:      63 inches Weight:      204.25 pounds BMI:     36.31 Pulse rate:   90 / minute BP sitting:   132 / 78  (left arm) Cuff size:   regular  Vitals Entered By: Haze Boyden, CMA (November 02, 2010 11:53 AM)  Physical Exam  General:  Well nourished, well developed, in no acute distress HEENT: normal Neck: I cannot appreciate JVD Cardiac:  distant normal S1, S2; RRR; no murmur Lungs:  clear to auscultation bilaterally, no rales; exp wheezes noted throughout Abd: soft, nontender, no hepatomegaly Ext: 1+ ankle edema; right radial site without hematoma or bruit Skin: warm and dry Neuro:  CNs 2-12 intact, no focal abnormalities noted    EKG  Procedure date:  11/02/2010  Findings:      Normal Sinus Rhythm Heart rate 90 Normal axis Poor R wave progression No ischemic changes  Impression & Recommendations:  Problem # 1:  CAD, NATIVE VESSEL (ICD-414.01) Stable.  Continue aspirin, beta blocker and statin.  Recent chest symptoms related to COPD.  As noted, cardiac catheterization with patent stents and nonobstructive disease.  Followup with Dr. Tedra Senegal in 4-6 weeks.  Orders: EKG w/ Interpretation (93000)  Problem # 2:  C O P D WITH ACUTE EXACERBATION (ICD-491.21) Her symptoms have worsened since she reduced her prednisone to 25 mg a day.  I have recommended that she go back to 30 mg a day and followup with pulmonary sooner.  Problem # 3:  HYPERTENSION (ICD-401.9) Controlled.  Orders: TLB-BMP (Basic  Metabolic Panel-BMET) ([id number]-[id number])  Problem # 4:  HYPERCHOLESTEROLEMIA  IIA (ICD-272.0) Due to the interaction between simvastatin and amlodipine, she will be changed over to generic Lipitor 40 mg a day.  She will have lipids and LFTs checked in 8 weeks.  Her updated medication list for this problem includes:    Lipitor 40 Mg Tabs (Atorvastatin calcium) .Marland Kitchen... 1 tab at bedtime  Problem # 5:  EDEMA (ICD-782.3) I suspect her edema is mainly related to her prednisone therapy.  She does have a history of grade 1 diastolic dysfunction, mild LVH and normal LV function.  It certainly possible that she does have some mild diastolic heart failure.  I will increase her furosemide further to 60 mg a day.  She will have a basic metabolic panel today and a BNP.  She will have a repeat basic metabolic panel in one week. Orders: TLB-BNP (B-Natriuretic Peptide) ([id number]-BNPR)  Anticoagulation Management Assessment/Plan:  Patient Instructions: 1)  Your physician recommends that you schedule a follow-up appointment in: DR. Riley Kill 12/28/10 @ 11:15AM 2)  Your physician recommends that you return for lab work in: TODAY BMET 401.9, BNP 782.3...Marland KitchenMarland KitchenMarland Kitchen 3)  Your physician has recommended you make the following change in your medication: INCREASE LASIX TO 60 MG ONCE DAILY.Marland KitchenALSO INCREASE PREDNSIONE TO 30 MG ONCE DAILY. STOP TAKING VYTORIN AND START TAKING LIPITOR 40 MG ONCE DAILY, 8 WEEKS AFTER STARTING LIPITOR SCOTT WEAVER, PA WOULD LIKE FOR YOU TO HAVE A LIVER AND LIPID  FUNCTION BLOOD TEST, THIS BLOOD TEST DOES NEED TO BE FASTING. Prescriptions: LIPITOR 40 MG TABS (ATORVASTATIN CALCIUM) 1 TAB at bedtime  #30 x 3   Entered by:   Danielle Rankin, CMA   Authorized by:   Tereso Newcomer PA-C   Signed by:   Danielle Rankin, CMA on 11/02/2010   Method used:   Electronically to        [facility] [phone]* (retail)       [address] [place], [place]  [address]       Ph: [phone]       Fax: [phone]    RxID:   [id number] FUROSEMIDE 20 MG TABS (FUROSEMIDE) 3 TABS once daily  #90 x 3   Entered by:   Danielle Rankin, CMA   Authorized by:   Tereso Newcomer PA-C   Signed by:   Danielle Rankin, CMA on 11/02/2010   Method used:   Electronically to        [facility] [address] [phone]* (retail)       [address] [place], [place]  [address]       Ph: [phone]       Fax: [phone]   RxID:   [id number]  I have personally reviewed the prescriptions today for accuracy.. . Tereso Newcomer PA-C  November 02, 2010 4:37 PM

## 2010-12-06 ENCOUNTER — Encounter: Payer: Self-pay | Admitting: Pulmonary Disease

## 2010-12-07 ENCOUNTER — Ambulatory Visit (HOSPITAL_BASED_OUTPATIENT_CLINIC_OR_DEPARTMENT_OTHER): Payer: Medicare Other | Attending: Pulmonary Disease

## 2010-12-07 ENCOUNTER — Encounter: Payer: Self-pay | Admitting: Pulmonary Disease

## 2010-12-07 DIAGNOSIS — I10 Essential (primary) hypertension: Secondary | ICD-10-CM | POA: Insufficient documentation

## 2010-12-07 DIAGNOSIS — G2581 Restless legs syndrome: Secondary | ICD-10-CM | POA: Insufficient documentation

## 2010-12-07 DIAGNOSIS — IMO0002 Reserved for concepts with insufficient information to code with codable children: Secondary | ICD-10-CM | POA: Insufficient documentation

## 2010-12-07 DIAGNOSIS — J449 Chronic obstructive pulmonary disease, unspecified: Secondary | ICD-10-CM | POA: Insufficient documentation

## 2010-12-07 DIAGNOSIS — R404 Transient alteration of awareness: Secondary | ICD-10-CM | POA: Insufficient documentation

## 2010-12-07 DIAGNOSIS — J4489 Other specified chronic obstructive pulmonary disease: Secondary | ICD-10-CM | POA: Insufficient documentation

## 2010-12-07 DIAGNOSIS — G4733 Obstructive sleep apnea (adult) (pediatric): Secondary | ICD-10-CM | POA: Insufficient documentation

## 2010-12-07 DIAGNOSIS — Z794 Long term (current) use of insulin: Secondary | ICD-10-CM | POA: Insufficient documentation

## 2010-12-07 DIAGNOSIS — R5383 Other fatigue: Secondary | ICD-10-CM | POA: Insufficient documentation

## 2010-12-07 DIAGNOSIS — R5381 Other malaise: Secondary | ICD-10-CM | POA: Insufficient documentation

## 2010-12-07 DIAGNOSIS — Z79899 Other long term (current) drug therapy: Secondary | ICD-10-CM | POA: Insufficient documentation

## 2010-12-07 DIAGNOSIS — Z7902 Long term (current) use of antithrombotics/antiplatelets: Secondary | ICD-10-CM | POA: Insufficient documentation

## 2010-12-07 DIAGNOSIS — R635 Abnormal weight gain: Secondary | ICD-10-CM | POA: Insufficient documentation

## 2010-12-07 NOTE — Assessment & Plan Note (Signed)
Summary: 3 weeks/cb   Visit Type:  Follow-up Copy to:  Dr. Riley Kill Primary Provider/Referring Provider:  Reather Littler, FNP   CC:  Pt states breathing is better feels it is back to baseline. Pt c/o some wheezing, intermittent cough, and ankle swelling. Feels her symptoms are worse after eating fried foods.  History of Present Illness: 69/F, ex- smoker, quit 2001 for FU of COPD/asthma, on steroids since july'11 .  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 Triggers being activity, perfumes, spring allergies (claritin has helped this year) , cold weather etc.  Spirometry >> FEv1 48 % c/w severe airway obstruction. upper airway  pseudo wheeze which improves on calming down Son (EMT) reports severe anxiety, now on ativan  Added  spiriva june '11 to improve exercise tolerance - good results Has a bird- cockatoo, clean cage.  RAST - mildly pos for cat & dog dander   October 12, 2010 1:20 PM [facility] consult Adm for chest tightness radiating to jaw, enzymes neg, CXR neg, No orthopnea/ PND Iv solumedrol started for bronchospasm Sugars high   10/26/10--Presents  post hosp - states breathing has improved since hosp discharge. Pt was admitted 12/12-12/17/11 for COPD exacerbation and atypical chest pain w/ neg enzymes and s/p  cardiac catheterization on October 14, 2010, showing no new lesions, patent stents. . Tx w/ steroids, pulmonary hygiene, lopressor was changed to bystolic and Advair was changed to pulmicort and spiriva. Discharged on steroid taper.    November 29, 2010 10:10 AM  pred at 20 , back up to 30 mg for a day or two when she had a flare after eating out ? open grill, lopressor changed to bystolic, lasix increased to 60 mg, c/o pedal edema Son reports excessive daytime somnolence , soft, snoring +, no witnessed apneas, large wt gain fro prednisone over last year.      Preventive Screening-Counseling & Management  Alcohol-Tobacco     Smoking Status: quit     Packs/Day: 1.5     Year  Started: 1970     Year Quit: 2001  Current Medications (verified): 1)  Furosemide 20 Mg Tabs (Furosemide) .... 3 Tabs Once Daily 2)  Fluoxetine Hcl 20 Mg Caps (Fluoxetine Hcl) .... Take 1 Capsule By Mouth Two Times A Day 3)  Lipitor 40 Mg Tabs (Atorvastatin Calcium) .Marland Kitchen.. 1 Tab At Bedtime 4)  Klor-Con M20 20 Meq Cr-Tabs (Potassium Chloride Crys Cr) .... Take 1 Tablet By Mouth Once A Day 5)  Isosorbide Mononitrate Cr 30 Mg Xr24h-Tab (Isosorbide Mononitrate) .... Take One Tablet By Mouth As Needed 6)  Aspirin 81 Mg Tbec (Aspirin) .... Take One Tablet By Mouth Daily 7)  Metformin Hcl 1000 Mg Tabs (Metformin Hcl) .... Take 1 Tablet By Mouth Two Times A Day 8)  Amlodipine Besylate 5 Mg Tabs (Amlodipine Besylate) .... Take One Tablet By Mouth Daily 9)  Spiriva Handihaler 18 Mcg Caps (Tiotropium Bromide Monohydrate) .... Once Daily 10)  Allergy Relief 10 Mg Tabs (Loratadine) .... Once A Day 11)  Mucinex Dm Maximum Strength 60-1200 Mg Xr12h-Tab (Dextromethorphan-Guaifenesin) .... Take One By Mouth Two Times A Day As Needed 12)  Fexofenadine Hcl 180 Mg Tabs (Fexofenadine Hcl) .Marland Kitchen.. 1 By Mouth Once Daily As Needed Allergies 13)  Prednisone 10 Mg Tabs (Prednisone) .... Take 2-3 Tablet By Mouth Once A Day 14)  Ativan 1 Mg Tabs (Lorazepam) .Marland Kitchen.. 1-2 Tabs By Mouth Two Times A Day As Needed Anxiety 15)  Lantus Solostar 100 Unit/ml Soln (Insulin Glargine) .Marland KitchenMarland KitchenMarland Kitchen 14  Units At Bedtime 16)  Bystolic 5 Mg Tabs (Nebivolol Hcl) .... Once Daily 17)  Dexilant 60 Mg Cpdr (Dexlansoprazole) .... Once Daily 18)  Zyrtec Allergy 10 Mg Tabs (Cetirizine Hcl) .... Take 1 Tablet By Mouth Every Morning 19)  Humalog Kwikpen 100 Unit/ml Soln (Insulin Lispro (Human)) .... Sliding Scale With Each Meal 20)  Pulmicort 0.25 Mg/44ml Susp (Budesonide) .... Inhale 1 Vial in Hhn Two Times A Day 21)  Albuterol Sulfate (2.5 Mg/12ml) 0.083% Nebu (Albuterol Sulfate) .... Inhale 1 Vial in Hhn Every 4-6 Hours As Needed  Allergies (verified): 1)  !  Morphine 2)  ! Plavix (Clopidogrel Bisulfate) 3)  ! * Benzonatate  Past History:  Past Medical History: Last updated: 11/02/2010 Current Problems:  CAD, NATIVE VESSEL (ICD-414.01)   a.  s/p multiple stents in past   b.  cath 10/15/2010: LAD stent ok; D1 30%; RCA 40% after stent, RCA stents patent; EF 60% Echo 09/2010: mild LVH; EF 55-65%; Gr. 1 Diast. Dysfxn; mild AI; pulmo. pressures were not assessed (no pulm. 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) C O P D (ICD-496) ANXIETY STATE, UNSPECIFIED (ICD-300.00) DIABETES MELLITUS, TYPE II (ICD-250.00) ASTHMA, UNSPECIFIED, UNSPECIFIED STATUS (ICD-493.90)    Social History: Last updated: 07/21/2010 Single, lives in [place], nonsmoker Patient states former smoker. since 2001 retired  Review of Systems       The patient complains of weight gain, dyspnea on exertion, and peripheral edema.  The patient denies anorexia, fever, weight loss, vision loss, decreased hearing, hoarseness, chest pain, syncope, prolonged cough, headaches, hemoptysis, abdominal pain, melena, hematochezia, severe indigestion/heartburn, hematuria, muscle weakness, suspicious skin lesions, difficulty walking, depression, unusual weight change, abnormal bleeding, enlarged lymph nodes, and angioedema.    Vital Signs:  Patient profile:   70 year old female Height:      63 inches Weight:      208 pounds BMI:     36.98 O2 Sat:      97 % on Room air Temp:     98.1 degrees F oral Pulse rate:   90 / minute BP sitting:   110 / 62  (right arm) Cuff size:   large  Vitals Entered By: Zackery Barefoot CMA (November 29, 2010 9:57 AM)  O2 Flow:  Room air CC: Pt states breathing is better feels it is back to baseline. Pt c/o some wheezing, intermittent cough, ankle swelling. Feels her symptoms are worse after eating fried foods Comments Medications reviewed with  patient Verified contact number and pharmacy with patient Zackery Barefoot [facility]  November 29, 2010 10:03 AM    Physical Exam  Additional Exam:  wt 197 August 26, 2010 >> 208 November 29, 2010  Gen. Pleasant, well-nourished, in no distress, normal affect ENT - no lesions, clear nasal mucus, class 3 airway Neck: No JVD, no thyromegaly, no carotid bruits Lungs: coarse BS w/ no wheezing , upper airway pseudowheeze Cardiovascular: Rhythm regular, heart sounds  normal, no murmurs or gallops, no peripheral edema Musculoskeletal: No deformities, no cyanosis or clubbing Skin- no lesions     Impression & Recommendations:  Problem # 1:  C O P D WITH ACUTE EXACERBATION (ICD-491.21) Assessment Improved Slow taper of pred by 5 mg q month, expect sugars to improve & labtus requirements to decrease stay on pulmicort & spiriva Agree with cardioselective bystolic  Problem # 2:  ANXIETY STATE, UNSPECIFIED (ICD-300.00) Assessment: Unchanged stay on ativan Her updated medication list for  this problem includes:    Fluoxetine Hcl 20 Mg Caps (Fluoxetine hcl) .Marland Kitchen... Take 1 capsule by mouth two times a day    Ativan 1 Mg Tabs (Lorazepam) .Marland Kitchen... 1-2 tabs by mouth two times a day as needed anxiety  Problem # 3:  OBSTRUCTIVE SLEEP APNEA (ICD-780.57) Assessment: New The pathophysiology of obstructive sleep apnea, it's cardiovascular consequences and modes of treatment including CPAP were discussed with the patient in great detail.  Proced with split study, OK to intervene with O2 or CPAP as needed Orders: Est. Patient Level V (16109) Prescription Created Electronically [phone]) DME Referral (DME)  Problem # 4:  EDEMA (ICD-782.3) Increase lasix to 60 mg until edema better, then back down to 40 mg - son will guide  Medications Added to Medication List This Visit: 1)  Prednisone 10 Mg Tabs (Prednisone) .... Take 2-3 tablet by mouth once a day 2)  Prednisone 5 Mg Tabs (Prednisone) .... Take as  directed  Patient Instructions: 1)  Copy sent to: Reather Littler FNP 2)  Please schedule a follow-up appointment in 1 month - March  3)  Decrease prednisone to 15 mg once daily  4)  If feeling well , drop to 10 mg in last week of Feb 5)  Sleep study 6)  STOP loratidine, stay on zyrtec Prescriptions: PREDNISONE 5 MG TABS (PREDNISONE) take as directed  #30 x 1   Entered and Authorized by:   Comer Locket Vassie Loll MD   Signed by:   Comer Locket Vassie Loll MD on 11/29/2010   Method used:   Electronically to        [facility] [phone]* (retail)       [address] [place], [place]  [address]       Ph: [phone]       Fax: [phone]   RxID:   [id number]

## 2010-12-20 DIAGNOSIS — G4733 Obstructive sleep apnea (adult) (pediatric): Secondary | ICD-10-CM

## 2010-12-20 DIAGNOSIS — R0609 Other forms of dyspnea: Secondary | ICD-10-CM

## 2010-12-20 DIAGNOSIS — R0989 Other specified symptoms and signs involving the circulatory and respiratory systems: Secondary | ICD-10-CM

## 2010-12-20 DIAGNOSIS — J449 Chronic obstructive pulmonary disease, unspecified: Secondary | ICD-10-CM

## 2010-12-22 ENCOUNTER — Telehealth: Payer: Self-pay | Admitting: Pulmonary Disease

## 2010-12-26 ENCOUNTER — Other Ambulatory Visit: Payer: Self-pay | Admitting: Cardiology

## 2010-12-26 ENCOUNTER — Ambulatory Visit (INDEPENDENT_AMBULATORY_CARE_PROVIDER_SITE_OTHER): Payer: Medicare Other | Admitting: Cardiology

## 2010-12-26 ENCOUNTER — Encounter: Payer: Self-pay | Admitting: Cardiology

## 2010-12-26 DIAGNOSIS — I2510 Atherosclerotic heart disease of native coronary artery without angina pectoris: Secondary | ICD-10-CM

## 2010-12-27 LAB — BASIC METABOLIC PANEL
CO2: 26 mEq/L (ref 19–32)
GFR: 34.73 mL/min — ABNORMAL LOW (ref >60.00)
Glucose, Bld: 133 mg/dL — ABNORMAL HIGH (ref 70–99)
Potassium: 4.6 mEq/L (ref 3.5–5.1)
Sodium: 142 mEq/L (ref 135–145)

## 2010-12-27 NOTE — Progress Notes (Signed)
Summary: Pt wants to start CPAP  Phone Note Outgoing Call   Call placed by: Comer Locket. Vassie Loll MD,  December 22, 2010 12:08 PM Reason for Call: Discuss lab or test results Summary of Call: Sleep study showed moderate obstructive sleep apnea & she would benefit from a CPAP. If she is in agreement, I would like to send out a Rx for one, can discuss more details on next OV Initial call taken by: Comer Locket. Vassie Loll MD,  December 22, 2010 12:09 PM  Follow-up for Phone Call        ATC pt at home #.  NA and unable to leave message.  Aundra Millet Reynolds LPN  December 22, 2010 12:17 PM   Additional Follow-up for Phone Call Additional follow up Details #1::        Called pt on cell phone and she agrees with RA recs. Zackery Barefoot CMA  December 22, 2010 4:38 PM  orders sent  Additional Follow-up by: Comer Locket. Vassie Loll MD,  December 24, 2010 7:57 PM

## 2010-12-27 NOTE — Medication Information (Signed)
Summary: [facility]  [facility]   Imported By: Sherian Rein 12/20/2010 08:00:43  _____________________________________________________________________  External Attachment:    Type:   Image     Comment:   External Document

## 2010-12-28 ENCOUNTER — Ambulatory Visit: Payer: Self-pay | Admitting: Cardiology

## 2011-01-09 LAB — BASIC METABOLIC PANEL
BUN: 24 mg/dL — ABNORMAL HIGH (ref 6–23)
BUN: 25 mg/dL — ABNORMAL HIGH (ref 6–23)
CO2: 24 mEq/L (ref 19–32)
Calcium: 8.4 mg/dL (ref 8.4–10.5)
Chloride: 101 mEq/L (ref 96–112)
Chloride: 103 mEq/L (ref 96–112)
Chloride: 106 mEq/L (ref 96–112)
Creatinine, Ser: 1.18 mg/dL (ref 0.4–1.2)
Creatinine, Ser: 1.33 mg/dL — ABNORMAL HIGH (ref 0.4–1.2)
GFR calc Af Amer: 48 mL/min — ABNORMAL LOW (ref >60)
Glucose, Bld: 208 mg/dL — ABNORMAL HIGH (ref 70–99)
Glucose, Bld: 250 mg/dL — ABNORMAL HIGH (ref 70–99)
Potassium: 4.4 mEq/L (ref 3.5–5.1)
Potassium: 4.7 mEq/L (ref 3.5–5.1)
Potassium: 4.9 mEq/L (ref 3.5–5.1)
Sodium: 133 mEq/L — ABNORMAL LOW (ref 135–145)

## 2011-01-09 LAB — CBC
HCT: 38 % (ref 36.0–46.0)
HCT: 41 % (ref 36.0–46.0)
MCH: 29.1 pg (ref 26.0–34.0)
MCHC: 33.7 g/dL (ref 30.0–36.0)
MCHC: 34.2 g/dL (ref 30.0–36.0)
MCV: 85.8 fL (ref 78.0–100.0)
MCV: 86.5 fL (ref 78.0–100.0)
Platelets: 230 10*3/uL (ref 150–400)
RDW: 13.7 % (ref 11.5–15.5)
WBC: 15.8 10*3/uL — ABNORMAL HIGH (ref 4.0–10.5)

## 2011-01-09 LAB — GLUCOSE, CAPILLARY
Glucose-Capillary: 181 mg/dL — ABNORMAL HIGH (ref 70–99)
Glucose-Capillary: 243 mg/dL — ABNORMAL HIGH (ref 70–99)
Glucose-Capillary: 278 mg/dL — ABNORMAL HIGH (ref 70–99)

## 2011-01-10 LAB — CBC
Hemoglobin: 13 g/dL (ref 12.0–15.0)
MCH: 28 pg (ref 26.0–34.0)
MCHC: 32.1 g/dL (ref 30.0–36.0)
MCV: 87.3 fL (ref 78.0–100.0)
Platelets: 195 10*3/uL (ref 150–400)
Platelets: 198 10*3/uL (ref 150–400)
RBC: 4.72 MIL/uL (ref 3.87–5.11)
WBC: 9.7 10*3/uL (ref 4.0–10.5)

## 2011-01-10 LAB — BASIC METABOLIC PANEL
BUN: 15 mg/dL (ref 6–23)
CO2: 25 mEq/L (ref 19–32)
Calcium: 8.9 mg/dL (ref 8.4–10.5)
Creatinine, Ser: 1.19 mg/dL (ref 0.4–1.2)
Glucose, Bld: 180 mg/dL — ABNORMAL HIGH (ref 70–99)

## 2011-01-10 LAB — COMPREHENSIVE METABOLIC PANEL
Albumin: 3.2 g/dL — ABNORMAL LOW (ref 3.5–5.2)
BUN: 15 mg/dL (ref 6–23)
Calcium: 9.2 mg/dL (ref 8.4–10.5)
Creatinine, Ser: 1.18 mg/dL (ref 0.4–1.2)
Potassium: 4.4 mEq/L (ref 3.5–5.1)
Total Protein: 5.8 g/dL — ABNORMAL LOW (ref 6.0–8.3)

## 2011-01-10 LAB — POCT CARDIAC MARKERS: Troponin i, poc: <0.05 ng/mL (ref 0.00–0.09)

## 2011-01-10 LAB — CARDIAC PANEL(CRET KIN+CKTOT+MB+TROPI)
CK, MB: 1.7 ng/mL (ref 0.3–4.0)
CK, MB: 2 ng/mL (ref 0.3–4.0)
Relative Index: INVALID (ref 0.0–2.5)
Total CK: 31 U/L (ref 7–177)
Troponin I: 0.02 ng/mL (ref 0.00–0.06)

## 2011-01-10 LAB — LIPID PANEL
LDL Cholesterol: 85 mg/dL (ref 0–99)
Total CHOL/HDL Ratio: 2.7 RATIO
Triglycerides: 140 mg/dL (ref <150)
VLDL: 28 mg/dL (ref 0–40)

## 2011-01-10 LAB — CK TOTAL AND CKMB (NOT AT ARMC): Relative Index: INVALID (ref 0.0–2.5)

## 2011-01-10 LAB — GLUCOSE, CAPILLARY
Glucose-Capillary: 227 mg/dL — ABNORMAL HIGH (ref 70–99)
Glucose-Capillary: 263 mg/dL — ABNORMAL HIGH (ref 70–99)
Glucose-Capillary: 266 mg/dL — ABNORMAL HIGH (ref 70–99)
Glucose-Capillary: 340 mg/dL — ABNORMAL HIGH (ref 70–99)
Glucose-Capillary: 402 mg/dL — ABNORMAL HIGH (ref 70–99)

## 2011-01-10 LAB — TROPONIN I: Troponin I: 0.03 ng/mL (ref 0.00–0.06)

## 2011-01-10 LAB — D-DIMER, QUANTITATIVE: D-Dimer, Quant: 1.09 ug/mL-FEU — ABNORMAL HIGH (ref 0.00–0.48)

## 2011-01-10 LAB — APTT: aPTT: 23 seconds — ABNORMAL LOW (ref 24–37)

## 2011-01-12 ENCOUNTER — Ambulatory Visit (INDEPENDENT_AMBULATORY_CARE_PROVIDER_SITE_OTHER): Payer: Medicare Other | Admitting: Pulmonary Disease

## 2011-01-12 ENCOUNTER — Encounter: Payer: Self-pay | Admitting: Pulmonary Disease

## 2011-01-12 DIAGNOSIS — J449 Chronic obstructive pulmonary disease, unspecified: Secondary | ICD-10-CM

## 2011-01-12 DIAGNOSIS — G4730 Sleep apnea, unspecified: Secondary | ICD-10-CM

## 2011-01-17 ENCOUNTER — Other Ambulatory Visit: Payer: Self-pay | Admitting: Pulmonary Disease

## 2011-01-17 NOTE — Assessment & Plan Note (Signed)
Summary: 6wk follow up   Visit Type:  Follow-up Referring Provider:  Dr. Riley Kill Primary Provider:  Reather Littler, FNP   CC:  Sob.  History of Present Illness: Dashawn is in for follow up.  Overall she is doing pretty well.  She still has some shortness of breath, and she is beeing actively followed  in the pulmonary clinic.  If she gets tight, it seems to mostly be related to breathing.   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 2 Tabs Once Daily 2)  Fluoxetine Hcl 20 Mg Caps (Fluoxetine Hcl) .... Take 1 Capsule By Mouth Two Times A Day 3)  Lipitor 40 Mg Tabs (Atorvastatin Calcium) .Marland Kitchen.. 1 Tab At Bedtime 4)  Klor-Con 10 10 Meq Cr-Tabs (Potassium Chloride) .... Take 1 Tablet By Mouth Once A Day 5)  Isosorbide Mononitrate Cr 30 Mg Xr24h-Tab (Isosorbide Mononitrate) .... Take One Tablet By Mouth As Needed 6)  Aspirin 81 Mg Tbec (Aspirin) .... Take One Tablet By Mouth Daily 7)  Metformin Hcl 1000 Mg Tabs (Metformin Hcl) .... Take 1 Tablet By Mouth Two Times A Day 8)  Amlodipine Besylate 5 Mg Tabs (Amlodipine Besylate) .... Take One Tablet By Mouth Daily 9)  Spiriva Handihaler 18 Mcg Caps (Tiotropium Bromide Monohydrate) .... Once Daily 10)  Zyrtec Allergy 10 Mg Tabs (Cetirizine Hcl) .... Take 1 Tablet By Mouth Once A Day 11)  Ativan 1 Mg Tabs (Lorazepam) .Marland Kitchen.. 1-2 Tabs By Mouth Two Times A  Day As Needed Anxiety 12)  Lantus Solostar 100 Unit/ml Soln (Insulin Glargine) .Marland Kitchen.. 14 Units At Bedtime 13)  Bystolic 5 Mg Tabs (Nebivolol Hcl) .... Once Daily 14)  Dexilant 60 Mg Cpdr (Dexlansoprazole) .... Once Daily 15)  Humalog Kwikpen 100 Unit/ml Soln (Insulin Lispro (Human)) .... Sliding Scale With Each Meal 16)  Pulmicort 0.25 Mg/49ml Susp (Budesonide) .... Inhale 1 Vial in Hhn Two Times A Day 17)  Albuterol Sulfate (2.5 Mg/2ml) 0.083% Nebu (Albuterol Sulfate) .... Inhale 1 Vial in Hhn Every 4-6 Hours As Needed  Allergies: 1)  ! Morphine 2)  ! Plavix (Clopidogrel Bisulfate) 3)  ! * Benzonatate  Vital Signs:  Patient profile:   70 year old female Height:      63 inches Weight:      207 pounds BMI:     36.80 O2 Sat:      98 % on Room air Pulse rate:   80 / minute Pulse rhythm:   regular Resp:     20 per minute BP sitting:   126 / 60  (left arm) Cuff size:   large  Vitals Entered By: Vikki Ports (December 26, 2010 3:55 PM)  O2 Flow:  Room air  Physical Exam  General:  Well developed, well nourished, in no acute distress. Head:  normocephalic and atraumatic Eyes:  PERRLA/EOM intact; conjunctiva and lids normal. Lungs:  Some exp wheezes. No rales.  clear to percussion. Heart:  PMI non displaced. NOrmal S1 and S2.  No murmur or rub.   Abdomen:  Bowel sounds positive; abdomen soft and non-tender without masses, organomegaly, or hernias noted. No hepatosplenomegaly. Pulses:  pulses normal in all 4 extremities Extremities:  No clubbing or cyanosis. Neurologic:  Alert and oriented x 3.   Cardiac Cath  Procedure date:  10/15/2010  Findings:      Left main coronary artery was normal.  Left anterior descending artery had 20% tubular disease proximally. There is a stent prior to the takeoff of the septal perforator which was widely patent.  Distal LAD was a small size vessel.  First diagonal branch had 30% tubular disease.  Circumflex coronary artery was normal.   There was an AV groove branch and a large obtuse marginal branch.  Right coronary artery was dominant.  There appeared to be multiple stents in the proximal and midportion of the RCA.  Just proximal to the origin of the stented region in the proximal RCA, there was a 40% tubular lesion.  The distal RCA was widely patent with a large PDA and three posterolateral branches.  RAO ventriculography:  RAO ventriculography was normal.  EF was 60%. There was no gradient across the aortic valve and no MR.  LV pressure was 145/10.  Aortic pressure was 145/73.  There was mild dilatation of the ascending aortic root.  Impression & Recommendations:  Problem # 1:  CAD, NATIVE VESSEL (ICD-414.01) Based on most recent angiographic data favor continued medical therapy.  She is on a good regimen. Her updated medication list for this problem includes:    Isosorbide Mononitrate Cr 30 Mg Xr24h-tab (Isosorbide mononitrate) .Marland Kitchen... Take one tablet by mouth as needed    Aspirin 81 Mg Tbec (Aspirin) .Marland Kitchen... Take one tablet by mouth daily    Amlodipine Besylate 5 Mg Tabs (Amlodipine besylate) .Marland Kitchen... Take one tablet by mouth daily    Bystolic 5 Mg Tabs (Nebivolol hcl) ..... Once daily  Orders: TLB-BMP (Basic Metabolic Panel-BMET) (80048-METABOL)  Problem # 2:  HYPERCHOLESTEROLEMIA  IIA (ICD-272.0) She can have this checked at home Her updated medication list for this problem includes:    Lipitor 40 Mg Tabs (Atorvastatin calcium) .Marland Kitchen... 1 tab at bedtime  Problem # 3:  C O P D WITH ACUTE EXACERBATION (ICD-491.21) Being followed in pulmonary clinic at the present time.  The following medications were removed from the medication list:    Prednisone 10 Mg Tabs (Prednisone) .Marland Kitchen... Take 2-3 tablet by mouth once a day    Prednisone 5 Mg Tabs (Prednisone) .Marland Kitchen... Take as directed Her updated medication list for this problem includes:    Spiriva Handihaler 18 Mcg Caps (Tiotropium bromide monohydrate) ..... Once daily     Pulmicort 0.25 Mg/47ml Susp (Budesonide) ..... Inhale 1 vial in hhn two times a day    Albuterol Sulfate (2.5 Mg/48ml) 0.083% Nebu (Albuterol sulfate) ..... Inhale 1 vial in hhn every 4-6 hours as needed  Problem # 4:  HYPERTENSION (ICD-401.9) check BMET with furosemide. Her updated medication list for this problem includes:    Furosemide 20 Mg Tabs (Furosemide) .Marland Kitchen... 2 tabs once daily    Aspirin 81 Mg Tbec (Aspirin) .Marland Kitchen... Take one tablet by mouth daily    Amlodipine Besylate 5 Mg Tabs (Amlodipine besylate) .Marland Kitchen... Take one tablet by mouth daily    Bystolic 5 Mg Tabs (Nebivolol hcl) ..... Once daily  Patient Instructions: 1)  Your physician recommends  that you schedule a follow-up appointment in: 4 MONTHS 2)  Your physician recommends that you have  lab work today: BMP 3)  Your physician recommends that you continue on your current medications as directed. Please refer to the Current Medication list given to you today.

## 2011-01-17 NOTE — Assessment & Plan Note (Signed)
Visit Type:  Follow-up Copy to:  Dr. Riley Kill Primary Provider/Referring Provider:  Reather Littler, FNP   CC:  Pt states breathing is improved and productive cough yellow mucus starting yesterday. Pt c/o upper and lower extremity edema.  History of Present Illness: 69/F, ex- smoker, quit 2001 for FU of COPD/asthma, on steroids since july'11 .  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 Triggers being activity, perfumes, spring allergies (claritin has helped this year) , cold weather etc.  Spirometry >> FEv1 48 % c/w severe airway obstruction. upper airway  pseudo wheeze which improves on calming down Son (EMT) reports severe anxiety, now on ativan  Added  spiriva june '11 to improve exercise tolerance - good results Has a bird- cockatoo, clean cage.  RAST - mildly pos for cat & dog dander  Admitted 12/12-12/17/11 for COPD exacerbation and atypical chest pain w/ neg enzymes and s/p  cardiac catheterization on October 14, 2010, showing no new lesions, patent stents. . Tx w/ steroids, pulmonary hygiene, lopressor was changed to bystolic and Advair was changed to pulmicort and spiriva.   November 29, 2010 -pred at 20 , back up to 30 mg for a day or two when she had a flare after eating out ? open grill, lopressor changed to bystolic, lasix increased to 60 mg, c/o pedal edema Son reports excessive daytime somnolence , soft, snoring +, no witnessed apneas, large wt gain fro prednisone over last year.  January 12, 2011 - Off steroids x 2 weeks, doing well overall, sugars much better controlled CPAP is really helping, download shows no residual events., pressur eok, mask ok, no dry mouth, sleeps well     Preventive Screening-Counseling & Management  Alcohol-Tobacco     Smoking Status: quit     Packs/Day: 1.5     Year Started: 1970     Year Quit: 2001  Current Medications (verified): 1)  Furosemide 20 Mg Tabs (Furosemide) .... 2 Tabs Once Daily 2)  Fluoxetine Hcl 20 Mg Caps (Fluoxetine Hcl) .... Take 1  Capsule By Mouth Two Times A Day 3)  Klor-Con 10 10 Meq Cr-Tabs (Potassium Chloride) .... Take 1 Tablet By Mouth Once A Day 4)  Isosorbide Mononitrate Cr 30 Mg Xr24h-Tab (Isosorbide Mononitrate) .... Take One Tablet By Mouth As Needed 5)  Aspirin 81 Mg Tbec (Aspirin) .... Take One Tablet By Mouth Daily 6)  Metformin Hcl 1000 Mg Tabs (Metformin Hcl) .... Take 1 Tablet By Mouth Two Times A Day 7)  Amlodipine Besylate 5 Mg Tabs (Amlodipine Besylate) .... Take One Tablet By Mouth Daily 8)  Spiriva Handihaler 18 Mcg Caps (Tiotropium Bromide Monohydrate) .... Once Daily 9)  Zyrtec Allergy 10 Mg Tabs (Cetirizine Hcl) .... Take 1 Tablet By Mouth Once A Day 10)  Ativan 1 Mg Tabs (Lorazepam) .Marland Kitchen.. 1-2 Tabs By Mouth Two Times A Day As Needed Anxiety 11)  Lantus Solostar 100 Unit/ml Soln (Insulin Glargine) .Marland Kitchen.. 14 Units At Bedtime 12)  Bystolic 5 Mg Tabs (Nebivolol Hcl) .... Once Daily 13)  Dexilant 60 Mg Cpdr (Dexlansoprazole) .... Once Daily 14)  Humalog Kwikpen 100 Unit/ml Soln (Insulin Lispro (Human)) .... Sliding Scale With Each Meal 15)  Pulmicort 0.25 Mg/64ml Susp (Budesonide) .... Inhale 1 Vial in Hhn Two Times A Day 16)  Albuterol Sulfate (2.5 Mg/21ml) 0.083% Nebu (Albuterol Sulfate) .... Inhale 1 Vial in Hhn Every 4-6 Hours As Needed  Allergies: 1)  ! Morphine 2)  ! Plavix (Clopidogrel Bisulfate) 3)  ! * Benzonatate 4)  !  Lipitor  Past History:  Past Medical History: Last updated: 11/02/2010 Current Problems:  CAD, NATIVE VESSEL (ICD-414.01)   a.  s/p multiple stents in past   b.  cath 10/15/2010: LAD stent ok; D1 30%; RCA 40% after stent, RCA stents patent; EF 60% Echo 09/2010: mild LVH; EF 55-65%; Gr. 1 Diast. Dysfxn; mild AI; pulmo. pressures were not assessed (no pulm. 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) C O P D (ICD-496) ANXIETY STATE, UNSPECIFIED (ICD-300.00) DIABETES MELLITUS,  TYPE II (ICD-250.00) ASTHMA, UNSPECIFIED, UNSPECIFIED STATUS (ICD-493.90)    Social History: Last updated: 07/21/2010 Single, lives in [place], nonsmoker Patient states former smoker. since 2001 retired  Review of Systems       The patient complains of dyspnea on exertion.  The patient denies anorexia, fever, weight loss, weight gain, vision loss, decreased hearing, hoarseness, chest pain, syncope, peripheral edema, prolonged cough, headaches, hemoptysis, abdominal pain, melena, hematochezia, severe indigestion/heartburn, muscle weakness, suspicious skin lesions, transient blindness, difficulty walking, depression, unusual weight change, abnormal bleeding, and enlarged lymph nodes.    Vital Signs:  Patient profile:   70 year old female Height:      63 inches Weight:      213 pounds BMI:     37.87 O2 Sat:      95 % on Room air Temp:     98.5 degrees F oral Pulse rate:   91 / minute BP sitting:   118 / 64  (left arm) Cuff size:   large  Vitals Entered By: Zackery Barefoot CMA (January 12, 2011 2:25 PM)  O2 Flow:  Room air CC: Pt states breathing is improved, productive cough yellow mucus starting yesterday. Pt c/o upper and lower extremity edema Comments Medications reviewed with patient Verified contact number and pharmacy with patient Zackery Barefoot CMA  January 12, 2011 2:25 PM    Physical Exam  Additional Exam:  wt 197 August 26, 2010 >> 208 November 29, 2010 >> 213 January 13, 2011  Gen. Pleasant, well-nourished, in no distress, normal affect ENT - no lesions, clear nasal mucus, class 3 airway Neck: No JVD, no thyromegaly, no carotid bruits Lungs: coarse BS w/ no wheezing , upper airway pseudowheeze Cardiovascular: Rhythm regular, heart sounds  normal, no murmurs or gallops, no peripheral edema Musculoskeletal: No deformities, no cyanosis or clubbing Skin- no lesions     Impression & Recommendations:  Problem # 1:  OBSTRUCTIVE SLEEP APNEA (ICD-780.57) Good results  with CPAP Compliance encouraged, wt loss emphasized, asked to avoid meds with sedative side effects, cautioned against driving when sleepy.  Chk download in 1 month & change to fixed pressure Orders: Est. Patient Level IV (16109) Prescription Created Electronically [phone]) DME Referral (DME)  Problem # 2:  C O P D (ICD-496) stay on current regimen we discussed signs of exacerbation & plan in case such happens. OA would prevent her participation in pulm rehab Stay on zyrtec during this season  Problem # 3:  EDEMA (ICD-782.3) lasix 40 mg once daily   Medications Added to Medication List This Visit: 1)  Proair Hfa 108 (90 Base) Mcg/act Aers (Albuterol sulfate) .... 2 puffs three times a day as needed  Patient Instructions: 1)  Copy sent to: 2)  Please schedule a follow-up appointment in 2 months. 3)  Stay on lasix & zyrtec 4)  rescue inhaler Rx sent 5)  Stay off predniosne 6)  CPAP is working well, download will  be checked in 1 month & changes made Prescriptions: PROAIR HFA 108 (90 BASE) MCG/ACT AERS (ALBUTEROL SULFATE) 2 puffs three times a day as needed  #1 x 3   Entered and Authorized by:   Comer Locket. Vassie Loll MD   Signed by:   Comer Locket Vassie Loll MD on 01/12/2011   Method used:   Electronically to        [facility] [phone]* (retail)       [address] [place], [place]  [address]       Ph: [phone]       Fax: [phone]   RxID:   [id number]

## 2011-01-18 ENCOUNTER — Other Ambulatory Visit: Payer: Self-pay | Admitting: Pulmonary Disease

## 2011-01-26 ENCOUNTER — Other Ambulatory Visit: Payer: Self-pay | Admitting: *Deleted

## 2011-01-26 MED ORDER — AMLODIPINE BESYLATE 5 MG PO TABS: 5.0000 mg | ORAL_TABLET | Freq: Every day | ORAL | Status: DC

## 2011-02-02 LAB — POCT I-STAT GLUCOSE: Glucose, Bld: 134 mg/dL — ABNORMAL HIGH (ref 70–99)

## 2011-02-09 ENCOUNTER — Encounter: Payer: Self-pay | Admitting: Pulmonary Disease

## 2011-02-19 ENCOUNTER — Other Ambulatory Visit: Payer: Self-pay | Admitting: Pulmonary Disease

## 2011-02-19 ENCOUNTER — Other Ambulatory Visit: Payer: Self-pay | Admitting: Cardiology

## 2011-02-19 ENCOUNTER — Telehealth: Payer: Self-pay | Admitting: Pulmonary Disease

## 2011-02-19 DIAGNOSIS — G4733 Obstructive sleep apnea (adult) (pediatric): Secondary | ICD-10-CM

## 2011-02-19 NOTE — Telephone Encounter (Signed)
Download 3/10-02/05/11 >> good control of events Change to fixed pressure 12 cm

## 2011-02-20 NOTE — Telephone Encounter (Signed)
Pt informed of RA's recs

## 2011-02-22 ENCOUNTER — Encounter: Payer: Self-pay | Admitting: Pulmonary Disease

## 2011-03-03 ENCOUNTER — Encounter: Payer: Self-pay | Admitting: Pulmonary Disease

## 2011-03-07 ENCOUNTER — Ambulatory Visit (INDEPENDENT_AMBULATORY_CARE_PROVIDER_SITE_OTHER): Payer: Medicare Other | Admitting: Pulmonary Disease

## 2011-03-07 DIAGNOSIS — G4730 Sleep apnea, unspecified: Secondary | ICD-10-CM

## 2011-03-07 DIAGNOSIS — J449 Chronic obstructive pulmonary disease, unspecified: Secondary | ICD-10-CM

## 2011-03-07 DIAGNOSIS — J4489 Other specified chronic obstructive pulmonary disease: Secondary | ICD-10-CM

## 2011-03-07 NOTE — Assessment & Plan Note (Signed)
Change to fixed pr 12 cm Change mask - full face or nasal ok - for leak

## 2011-03-07 NOTE — Patient Instructions (Signed)
Your CPAP is set at 12 cm Get new mask - full face or nasal. Taper off the prednisone Trial of daliresp daily - call in 10 days for Rx if no side effects

## 2011-03-07 NOTE — Progress Notes (Signed)
  Subjective:    Patient ID: [name] Lane, female    DOB: 1941/01/17, 70 y.o.   MRN: [id number]  HPI Copy to: Dr. Riley Kill  Primary Provider/Referring Provider: Reather Littler, FNP   69/F, ex- smoker, quit 2001 for FU of COPD/asthma, on steroids since july'11 to 3/12  Worked in a hosiery mill x 12y. Triggers being activity, perfumes, spring allergies (claritin has helped this year) , cold weather etc. Spirometry >> FEv1 48 % c/w severe airway obstruction.  upper airway pseudo wheeze which improves on calming down  Son (EMT) reports severe anxiety, now on ativan  Added spiriva june '11 to improve exercise tolerance - good results  Has a bird- cockatoo, clean cage.  RAST - mildly pos for cat & dog dander  Admitted 12/12-12/17/11 for COPD exacerbation and atypical chest pain w/ neg enzymes and s/p cardiac catheterization on October 14, 2010, showing no new lesions, patent stents. . Tx w/ steroids, pulmonary hygiene, lopressor was changed to bystolic and Advair was changed to pulmicort and spiriva.  2 pulses of steroids since march'12  03/07/2011 Pt states breathing is "pretty good" had a "set back" when was exposed fumes from a propane tank x 2 weeks ago - started on prednisone taper by PCP, Pedal edema  - on lasix Download 3/10-4/15/12 >> good control of events , leak ++ on full face mask, used son's nasal mask & liked it Changed to fixed pressure 12 cm 'what can I do to avoid flares?' - discussed daliresp     Review of Systems Pt denies any significant  nasal congestion or excess secretions, fever, chills, sweats, unintended wt loss, pleuritic or exertional cp, orthopnea pnd or leg swelling.  Pt also denies any obvious fluctuation in symptoms with weather or environmental change or other alleviating or aggravating factors.    Pt denies any increase in rescue therapy over baseline, denies waking up needing it or having early am exacerbations or coughing/wheezing/ or dyspnea         Objective:   Physical Exam Gen. Pleasant, well-nourished, in no distress, normal affect ENT - no lesions, no post nasal drip, steroid facies Neck: No JVD, no thyromegaly, no carotid bruits Lungs: no use of accessory muscles, no dullness to percussion, clear without rales or rhonchi  Cardiovascular: Rhythm regular, heart sounds  normal, no murmurs or gallops, no peripheral edema Abdomen: soft and non-tender, no hepatosplenomegaly, BS normal. Musculoskeletal: No deformities, no cyanosis or clubbing Neuro:  alert, non focal Skin- multiple bruises         Assessment & Plan:

## 2011-03-07 NOTE — Assessment & Plan Note (Signed)
Ct pulmicort / spiriva Discussed daliresp  - side effects , explained that benefit would be to decrease flares - do not expect breathing to change on a day to day basis. Cannot participate in pulm rehab due to knee issues

## 2011-03-14 NOTE — Assessment & Plan Note (Signed)
[facility]                            CARDIOLOGY OFFICE NOTE   NAME:ELLISIliyah, Bui                      MRN:          [id number]  DATE:12/31/2008                            DOB:          January 09, 1941    Ms. Sforza is in for a followup.  She is accompanied by her son, who is a  paramedic, and who is quite astute about her clinical situation.  She  has had some chest discomfort, but she still thinks it is related mostly  to her asthma.  The patient's last catheterization was done a while back  in 2004, at which time, she had about 50-70% mid-LAD area as well as 30%  RCA.  We have continued to treat her medically during the interim, she  has gotten along really quite well.  She has not convinced that any of  this is cardiac and it seems to be really related when her breathing  gets difficult.   MEDICATIONS:  1. Isosorbide mononitrate 30 mg daily.  2. Hydrochlorothiazide daily.  3. Advair.  4. Atenolol 25 mg one half b.i.d.  5. Amlodipine 5 mg daily.  6. Aspirin daily.  7. Metformin 1000 mg b.i.d.  8. Furosemide 20 mg daily.  9. Vytorin 10/40.  10.Ranitidine 150 b.i.d.  11.Fluoxetine b.i.d.  12.Potassium 10 mEq daily.   PHYSICAL EXAMINATION:  VITAL SIGNS:  Blood pressure is 136/80 and the  pulse is 78.  LUNGS:  Fields are clear.  There is prolonged expiration in general.  CARDIAC:  Rhythm is regular.   Her electrocardiogram demonstrates sinus rhythm with fusion complex  nonspecific T-wave abnormality.   The patient does need followup of her basic metabolic profile with her  family Aidian Salomon.  This is relatively important, given the fact she is on  dual diuretics.  She, her son, and I have talked in extensive detail  about her symptoms.  If there were any increase in symptoms, I would  have a fairly low threshold for repeat catheterization.  Of note, the  patient has had a retroperitoneal bleed on Lovenox in the past.     Arturo Morton. Riley Kill,  MD, [facility]  Electronically Signed    TDS/MedQ  DD: 01/10/2009  DT: 01/11/2009  Job #: [id number]

## 2011-03-14 NOTE — Assessment & Plan Note (Signed)
[facility]                            CARDIOLOGY OFFICE NOTE   NAME:[name], [name] Lane                      MRN:          [id number]  DATE:07/30/2007                            DOB:          Feb 03, 1941    [name] is in for followup.  In general, she has been stable.  She has  a lot of issues at home.  She was out in the waiting area and the  perfume bothers her.  She cannot get her breath very well when something  like this occurs.  She has also had some dizziness and on one episode,  her sugar had dropped.  She gets a spinning of the head and then feeling  as though she might pass out, but it is clearly preceded by an aura of  what sounds like vertigo.  She has had Doppler studies done, and these  have been done at home in [facility].  Apparently they have been stable.   MEDICATIONS:  1. K-Dur 10 mEq daily.  2. Imdur 30 mg daily.  3. Fluoxetine 20 mg daily.  4. Ranitidine 150 mg b.i.d.  5. Vytorin 10/40 daily.  6. Furosemide 20 mg daily.  7. Metformin 1000 mg b.i.d.  8. Advair 250/50 b.i.d.  9. Aspirin 81 mg daily.  10.Atenolol 12.5 b.i.d.   Today, the blood pressure is 170/90 and the pulse is 88.  The lung fields are clear.  Importantly, there is no significant  expiratory wheeze.  There is no significant extremity edema.  The cardiac rhythm was regular.   At the present time, we will add in amlodipine 5 mg daily to her  regimen.  I will have her return to the clinic in 6 weeks, at which time  we will reassess her status.  I have explained to her the potential  problems associated with amlodipine, which include lower extremity edema  as well.  Hopefully, this will improve on her blood pressure control,  which generally runs around the 140-150 range.  Our goal is to try to  keep her beta blocker dose as low as possible because of her underlying  asthma.  She will return in 6 weeks.     Arturo Morton. Riley Kill, MD, [facility] [place]  Electronically  Signed    TDS/MedQ  DD: 07/30/2007  DT: 07/30/2007  Job #: [id number]

## 2011-03-14 NOTE — Assessment & Plan Note (Signed)
[facility]                            CARDIOLOGY OFFICE NOTE   NAME:ELLISGenieve, Ramaswamy                      MRN:          [id number]  DATE:09/11/2007                            DOB:          Nov 13, 1940    DATE OF VISIT:  September 11, 2007.   Ms. Olenick was in for a followup visit.  In general, she is about the  same, she continues to have some shortness of breath, her arthritis is  bothering her a lot, she has Naprosyn and wonders if it is okay for her  to use.   Today, her medications include:  1. K-Dur 10 mEq daily.  2. Imdur 30 mg daily.  3. Fluoxetine 20 mg daily.  4. Rimantadine 150 mg b.i.d.  5. Vytorin 10/40.  6. Furosemide 20 mg daily.  7. Metformin 1000 mg b.i.d.  8. Advair.  9. Aspirin 81 mg daily.   PHYSICAL EXAMINATION:  VITAL SIGNS:  The blood pressure is 158/82, the  pulse is 95.  LUNG FIELDS:  Slight prolonged expiration, but otherwise clear.  CARDIAC:  Rhythm is regular.  EXTREMITIES:  No extremity edema.   The electrocardiogram demonstrates normal sinus rhythm and minor  nonspecific T-wave abnormalities.   IMPRESSION:  1. Coronary artery disease.  2. 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  3. Progressive arthritis.  4. Prior history of some renal insufficiency possibly associated with      nonsteroidal use.  5. Difficult to control hypertension.  6. Hyperlipidemia.   RECOMMENDATIONS:  1. Continue current medical regimen.  2. I advised her away from using the nonsteroidal antiinflammatory      drugs for her arthritis particularly because of their effects on      both blood pressure, salt and water retention, increased risk of      myocardial infarction, and finally a prior history of some renal      insufficiency.  3. I have asked her to see Ms. Sharp with regard to alternative      therapies for management of her arthritis.  4. The patient may require knee surgery.  5. She  will need a lipid and liver profile.  6. We will get a basic profile today in the office to make sure that      that is stable.     Arturo Morton. Riley Kill, MD, [facility]  Electronically Signed    TDS/MedQ  DD: 09/11/2007  DT: 09/11/2007  Job #: [id number]   cc:   Reather Littler, FNP

## 2011-03-14 NOTE — Assessment & Plan Note (Signed)
[facility]                            CARDIOLOGY OFFICE NOTE   NAME:ELLISZayley, Arras                      MRN:          [id number]  DATE:06/19/2007                            DOB:          Apr 01, 1941    Ms. Spagnolo is in for followup.  She is stable.  She recently had some lab  work done.  At that time, she had a basic profile with a BUN of 47,  creatinine 1.7, and a potassium of 5.9.  They stopped her potassium at  that time.  Importantly, they also started her on Furosemide, but she is  already on hydrochlorothiazide.  She has had rare episodes of chest  discomfort in general.   PHYSICAL:  Blood pressure is 129/60, pulse 71.  She does not have much in the way of wheezing.  EXTREMITIES:  Do not reveal significant edema, although there is 1+  edema.   The EKG reveals normal sinus rhythm and nonspecific T wave  abnormalities.   To summarize, she appears stable from a cardiac standpoint.  Her EF is  greater than 70%.  She does have some lower extremity edema and clearly  does have asthma.  I think we should go ahead and back off on her  atenolol, and I have asked her to cut her atenolol from 12.5 mg b.i.d.  to 6.25 b.i.d., and we are going to have her stop her  hydrochlorothiazide.  I am going to see her back in followup in about 2  weeks.  We will get a BMET today and then repeat at that time.     Arturo Morton. Riley Kill, MD, [facility]  Electronically Signed    TDS/MedQ  DD: 06/19/2007  DT: 06/20/2007  Job #: [id number]

## 2011-03-14 NOTE — Assessment & Plan Note (Signed)
[facility]                            CARDIOLOGY OFFICE NOTE   NAME:ELLISVertie, Dibbern                      MRN:          [id number]  DATE:05/14/2008                            DOB:          [birth year]    Ms. Perin is in for followup.  She has this occasional mild tightness,  but it is almost always related to her breathing.  It is no different  than what she has had in the past.  She has not had any typical  exertional type chest discomfort.  It talked about possible adjustments  in her lipid lowering medicines, the patient forgot to bring her  laboratory studies with her today.   Today on examination, the blood pressure is 130/70, the pulse is 75.  The lung fields are clear without significant expiatory wheezes and the  cardiac rhythm is regular.  The extremities reveal no edema.   Electrocardiogram demonstrates sinus rhythm.  There is nonspecific T-  wave abnormality.   Overall, I am pleased with how the patient is doing.  I plan to see her  back in followup in about 6 months' time.  Should she have any problems  in the interim, she should call us.  They will be sending me her labs,  and I will be reviewing them.     Arturo Morton. Riley Kill, MD, [facility]  Electronically Signed    TDS/MedQ  DD: 05/14/2008  DT: 05/15/2008  Job #: [id number]   cc:   [facility]  [facility]

## 2011-03-14 NOTE — Assessment & Plan Note (Signed)
[facility]                            CARDIOLOGY OFFICE NOTE   NAME:[name] Lane                      MRN:          [id number]  DATE:11/07/2007                            DOB:          12/12/1940    [name] Lane is in for followup.  In general, she has continued to remain  stable.  She has had a good holiday.  She gets a little tight when she  walks in the cold air, but a lot of this is related to her wheezing.  Overall, she seems to have been doing somewhat better.   MEDICATIONS:  Include:  1. K-Dur 10 mEq daily.  2. Imdur 30 mg p.r.n.  3. Fluoxetine 20 mg daily.  4. Ranitidine 150 mg b.i.d.  5. Vytorin 10/40 daily.  6. Furosemide 20 mg daily.  7. Metformin 1000 mg b.i.d.  8. Advair 25/50.  9. Aspirin 81 mg daily.  10.Amlodipine 5 mg daily.  11.Atenolol 25 mg 1/2 tablet p.o. b.i.d.   PHYSICAL EXAM:  VITAL SIGNS:  Today, her blood pressure is 138/76, pulse  75.  There is minimal expiratory wheezes.  CARDIAC:  Rhythm is regular without murmur.  EXTREMITIES:  Without edema.   IMPRESSION:  1. Coronary artery disease status post percutaneous coronary      intervention.  2. 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  3. History of arthritis.  4. Hypertension under better control.  5. Hyperlipidemia.   PLAN:  1. Return to clinic in 6 months.  2. Continue follow-up in [place] in the [facility].  3. Lipids will need to be obtained, when she is seen in followup      there.     Arturo Morton. Riley Kill, MD, [facility]  Electronically Signed   TDS/MedQ  DD: 11/07/2007  DT: 11/07/2007  Job #: [id number]   cc:   [name] Lane, M.D.

## 2011-03-14 NOTE — Assessment & Plan Note (Signed)
[facility]                            CARDIOLOGY OFFICE NOTE   NAME:[name], LILYA SMITHERMAN                      MRN:          [id number]  DATE:07/03/2007                            DOB:          [birth year]    [name] Lane is in for a followup visit today.  In general she is  stable.  She has not had any progressive shortness of breath.  She still  does have some swelling in the feet.  Her daughter and the patient both  admit that she does tend to use a fair amount of salt.  Her blood  pressures at home have been in the 140 - 150 range.   EXAMINATION:  Today the blood pressure is 160/82, the pulse is 64.  There is no significant expiratory wheezing and there is no real  extremity edema today.   IMPRESSION:  1. Coronary artery disease.  2. History of asthma.  3. History of reflux.  4. Hyperlipidemia.  5. 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  6. Coronary artery disease on anti-platelet therapy.   PLAN:  1. We will get a basic metabolic panel today.  2. She will return to the clinic in 4 weeks.  3. We have elected to back up on her atenolol and it will only be 12.5      mg twice a day so it should not exacerbate any type of airways      disease at that low dose.  This will also provide perhaps better      blood pressure control.  4. I have admonished her with regard to the use of sodium and we will      try to get her to reduce her dose.     Arturo Morton. Riley Kill, MD, [facility]  Electronically Signed    TDS/MedQ  DD: 07/03/2007  DT: 07/03/2007  Job #: [id number]

## 2011-03-17 NOTE — Cardiovascular Report (Signed)
[place]  Patient:    [name] Lane, [name] Lane Visit Number: [id number] MRN: [id number]          Service Type: MED Location: 2000 2030 01 Attending Physician:  Ronaldo Miyamoto Dictated by:   Arturo Morton Riley Kill, M.D. [facility] Proc. Date: 02/17/02 Admit Date:  02/13/2002   CC:         Cardiovascular Laboratory   Cardiac Catheterization  INDICATIONS:  [name] is a 70 year old female, well known to me. She previously had presented with a non ST elevation in lying and severe COPD.  She was treated in [facility] and subsequently given enoxaparin, but then developed a ruptured peritoneal bleed.  The exact etiology of this was uncertain, but thought possibly to be related to prior abdominal surgery, accompanied by raised intra-abdominal pressure from wheezing.  She had a large CA lesion at that time, and was treated conservatively.  She has done well in the interim, but recently developed recurrent symptoms suggestive of what she had when she had her infarction.  Because of this, she was readmitted to the hospital.  We placed her on no intravenous anticoagulants, but did start aspirin and Plavix.  She was brought to the lab today for further evaluation.  PROCEDURES: 1. Left heart catheterization. 2. Selective coronary arteriography. 3. Selective left ventriculography. 4. Proximal root aortography. 5. Percutaneous coronary intervention.  DESCRIPTION OF PROCEDURE:  The patient was brought to the catheterization lab, prepped and draped in sterile fashion.  Through an anterior puncture using a Smart needle, the left femoral artery was entered.  We did this because of a retroperitoneal bleed on the right in the past.  A 6-French sheath was placed.  We then did views of the left and right coronary arteries, and ventriculography.  We could not get into the subclavian to view the internal mammary, and therefore a root shot was obtained.  She tolerated all of  this well.  I had Dr. Chales Abrahams come to the Lab and review the films with me in detail. I then talked with the patient and also her family; specifically, she had a high-grade mid right that could be easily stented, but a more complicated mid-LAD.  Risks of target vessel revascularization would be higher; however, revascularization surgery would also potentially carry some risks because of the need for intensive anticoagulation.  We discussed all of these possibilities, and I elected to recommend proceeding on with stenting of both the RCA and LAD.  Attention was then turned towards the intervention.  Her blood pressure went up a little bit, and we gave her three doses of intravenous metoprolol to bring this down--prior to giving her any anticoagulation.  I elected to give her Angiomax, specifically to try and reduce the likelihood of bleeding and also for short action.  We gave Angiomax according to protocol, got an ACT after the blood pressure was down. A JR4 guiding catheter was utilized to intubate the right coronary artery.  A 0.014 Hi-Torque floppy wire was passed across the lesion, after adequate anticoagulation.  The lesion was directly stented using a 3.5 x 12 Express II stent.  With the balloon inflation, there was a little bit of watermelon seeding, but it appeared the stent still covered the area, and we carefully examined this.  We then post-dilated using a 4 mm balloon over the area of the disease.  There was marked improvement in the appearance of the artery.  With this, we then turned our attention to the  LAD.  The LAD was crossed with a Hi-Torque floppy wire.  We predilated with a 8 mm x 2.25 mm balloon; and then subsequently stented using a 2.25 x 8 mm Express II stent.  This was taken to 14 atm, with rather dramatic improvement in the appearance of the artery.  We selected an 8, so as not to have the distal end of the stent into the 90-degree bend that accompanies the LAD  in this location.  The final result was excellent.  We then took another shot of the right; this also looked quite good.  The procedure was completed.  She was taken to the holding area, where she will be held for sheath removal within about 2 hr following the discontinuation of Angiomax.  HEMODYNAMIC DATA: 1. Central aortic pressure:  154/86. 2. Left ventricular pressure:  144/13. 3. No gradient on pullback across the aortic valve.  ANGIOGRAPHIC DATA:  LEFT VENTRICULOGRAM:  Ventriculography in the RAO projection reveals preserved global systolic function.  It is hyperdynamic with an ejection fraction in excess of 70%.  AORTOGRAPHY:  The aortic root shot revealed no evidence of aortic dissection. There is a more proximal takeoff of the subclavian, and it hides behind the internal carotid and is not well seen; but the flow into the subclavian itself looks pretty good.  High-grade lesion is obviously not visualized.  CORONARIES: 1. LEFT MAIN:  Free of critical disease. 2. LEFT ANTERIOR DESCENDIGN ARTERY:  Supplies a large septal and diagonal, and    just afterwards in a bend is an 80% eccentric stenosis, in a relatively    small-caliber portion of the artery.  This was stented using the 2.25 mm    stent, with a reduction to 0% residual luminal narrowing and excellent    TIMI-3 flow distally.  There was minimal luminal irregularity after this    area. 3. CIRCUMFLEX CORONARY/INTERMEDIATE:  Appear to be free of critical disease,    other than minor irregularity in the mid circumflex. 4. RIGHT CORONARY ARTERY:  A large, dominant vessel; with a PDA and    posterolateral system.  There was an 80% mid stenosis dilated to 0%.  CONCLUSIONS: 1. Preserved left ventricular function. 2. Successful percutaneous stenting of the mid right coronary artery. 3. Successful percutaneous stenting of the mid left anterior descending.  DISPOSITION:  The risks of target vessel revascularization is  somewhat increased in the LAD because of mean lumen diameter.  However, we will follow  her clinically.  So far she has done well.  She will need to be treated with aspirin and Plavix.  Continuation of expert management of her pulmonary disease is also recommended. Dictated by:   Arturo Morton Riley Kill, M.D. [facility] Attending Physician:  Ronaldo Miyamoto DD:  02/17/02 TD:  02/17/02 Job: [id number]

## 2011-03-17 NOTE — Cardiovascular Report (Signed)
NAME:  [name] Lane, [name] Lane                       ACCOUNT NO.:  [id number]   MEDICAL RECORD NO.:  [id number]                   PATIENT TYPE:  OIB   LOCATION:  2876                                 FACILITY:  [facility]   PHYSICIAN:  Arturo Morton. Riley Kill, M.D. [facility]         DATE OF BIRTH:  02-Feb-1941   DATE OF PROCEDURE:  02/11/2003  DATE OF DISCHARGE:                              CARDIAC CATHETERIZATION   INDICATIONS:  [name] Lane is well-known to me.  She is a 70 year old female who  has had recurrent substernal chest pain.  She also has asthma.  She has a  history of prior retroperitoneal bleed spontaneously with Lovenox.  Over the  past year and a half she has had stenting of the left anterior descending  artery as well as stenting of the right coronary artery.  On the last study  nine months ago, the right coronary artery was widely patent, but the LAD  stent was restenoses partially, and it was redilated.  Unfortunately, it is  a 2.25 mm stent, and really quite small.  Recently, she was hurrying to go  to work, and she had an episode of syncope.  Her workup was negative at  [facility].  She says she has been having some tightness in the chest when  she exerts herself.  This is somewhat similar to what she has had in the  past, however, the family relates that she has been under quite a bit of  stress at home.  She was brought back tonight for repeat catheterization.   PROCEDURE:  1. Left heart catheterization.  2. Selective coronary arteriography.  3. Selective left ventriculography.   DESCRIPTION OF PROCEDURE:  The procedure was performed from the right  femoral artery using 6-French catheters.  She tolerated the procedure  overall well without complication.  I reviewed the studies, and compared  them to her previous digital studies.  There were no complications.   HEMODYNAMIC DATA:  1. Central aortic pressure 162/75, mean 110.  2. Left ventricle 161/15.  3. No gradient on pull-back  across the aortic valve.   ANGIOGRAPHIC DATA:  1. Ventriculography was performed in the RAO projection.  The overall     systolic function is well-preserved.  Because of the ventricular ectopy,     ejection fraction could not be calculated, but appeared to be in excess     of 70%.  There was ectopy-related mitral regurgitation.  2. The left main coronary artery is free of critical disease.  3. The LAD courses to the apex.  After the takeoff of the large diagonal,     the LAD is much smaller in caliber.  There is a previous stent.  At that     stent site in various views, it appears to be 50-70% renarrowed.  Again,     it is a small caliber vessel.  The remainder of the LAD is small in     caliber,  and slightly tortuous without critical disease.  The diagonal is     also without critical disease.  4. The circumflex provides a large marginal branch which is free of critical     disease.  The AV circumflex is relatively smaller.  5. The right coronary artery is a dominant vessel.  There is a previously     placed stent in the mid vessel without any evidence of restenosis     whatsoever.  The distal vessels are without critical disease.  There is     minor luminal irregularity.   CONCLUSION:  1. Well-preserved left ventricular function.  2. No evidence of restenosis from the previously stented right coronary     artery.  3. Partial repeat restenosis in the small mid left anterior descending     vessel distribution.   DISPOSITION:  The patient has been dilated twice in this area, and it is a  small caliber vessel with a high incidence of target vessel  revascularization.  Unfortunately, with this there is a substantial increase  in risk of repeat intervention for this.  Based upon the current findings, I  plan to review the films with my colleagues.  At the present time, stent  within a stent is not an option, particularly in light of the fact that the  stent was a 2.25 mm stent.  This  vessel could be redilated.  I am not  entirely convinced that it is causing ischemia, and the family claims that  she is under quite a bit of stress.  We may try to manage this medically for  the time-being until a 2.25 drug-eluting platform becomes available as an  alternative option.  Alternatively, she could be a candidate for minimally  invasive mammary artery implantation, but I would be reluctant to subject  her to that without at least one repeat dilatation.  I will talk with her  about the options.                                               Arturo Morton. Riley Kill, M.D. [facility]    TDS/MEDQ  D:  02/11/2003  T:  02/12/2003  Job:  [id number]

## 2011-03-17 NOTE — Assessment & Plan Note (Signed)
[facility]                             PULMONARY OFFICE NOTE   NAME:[name] Lane, [name] Lane                      MRN:          [id number]  DATE:02/21/2007                            DOB:          01-Aug-1941    REASON FOR CONSULTATION:  Dyspnea.   HISTORY:  This is a 70 year old white female who states that she  underwent angioplasty in 2001 and 2002 because she was short of breath.  However, ever since then she states that she has been gradually losing  ground in terms of her ability to ambulate 150 feet to her mailbox and  back without stopping. She describes associated dry cough and overt  heartburn symptoms that accompany the dyspnea, but there is no  exertional or pleuritic chest pain, orthopnea, PND, leg swelling, or  significant variability weather and environmental triggers. She does say  that either stopping or using an inhaler helps some, but never back to  baseline.   PAST MEDICAL HISTORY:  Significant for hypertension for which she is on  chronic ACE inhibitors, and also has been diagnosed asthma, diabetes,  and hyperlipidemia.   ALLERGIES:  None known.   MEDICATIONS:  1. DuoNeb p.r.n.  2. Enalapril 20 mg daily chronically.  3. Advair 250/50 b.i.d. which she has used since 2001 (the year it was      made).   SOCIAL HISTORY:  She quit smoking in 2001. She is retired with no  unusual travel, pet, or hobby exposure.   FAMILY HISTORY:  Recorded in detail on the worksheet and is significant  for asthma in her mother.   REVIEW OF SYSTEMS:  Recorded in detail on the worksheet and is negative  except as outlined above.   PHYSICAL EXAMINATION:  GENERAL:  This is a robust, ambulatory,  moderately obese white woman in no acute distress.  VITAL SIGNS:  Stable.  HEENT:  Reveals upper and lower dentures in place. Nasal turbinate  revealed minimal edema. Ear canals were clear bilaterally.  NECK:  Supple without cervical adenopathy or  tenderness.  LUNGS:  Fields were completely clear bilaterally to auscultation and  percussion, except for prominent pseudo wheeze.  HEART:  Regular rate and rhythm without murmur, gallop, or rub. S1 and  S2 were slightly diminished.  ABDOMEN:  Soft, obese, benign.  EXTREMITIES:  Warm without calf tenderness, cyanosis, clubbing, or  edema.   She had a chest x-ray in February 2008 which was felt to be normal.   IMPRESSION:  Classic pseudo wheeze that is either due to an ACE  inhibitor effect, an Advair effect, or secondary to poorly controlled  reflux. In the era of ARB equivalency, I believe it would appropriate  to try her off of Enalapril and on Benicar on a trial basis at 20 mg  daily for the next six weeks before return for PFTs.   Because reflux could playing a role, we have asked her to increase her  Ranitidine to b.i.d. dosing and reviewed with her on diet.   I have asked her also to reduce Advair down to one daily (since she has  no nocturnal symptoms at present) to see if, in fact, she has any  breakthrough nocturnal symptoms and if she does well on her next visit,  we might even consider eliminating Advair, since it may irritate the  upper airway more than it helps the lower airway.     Charlaine Dalton. Sherene Sires, MD, [facility]  Electronically Signed    MBW/MedQ  DD: 02/21/2007  DT: 02/22/2007  Job #: [id number]   cc:   Arturo Morton. Riley Kill, MD, FACC  Attn: Deloris Ping [facility]

## 2011-03-17 NOTE — Cardiovascular Report (Signed)
[place]  Patient:    [name] Lane                     MRN: [id number] Proc. Date: 11/18/99 Adm. Date:  [id number] Attending:  Ronaldo Miyamoto CC:         Arturo Morton. Riley Kill, M.D. [facility]             Nicki Reaper, M.D.             Cardiac Catheterization Laboratory                        Cardiac Catheterization  PROCEDURES PERFORMED:  Left heart catheterization with coronary angiography and  left ventriculography.  INDICATIONS:  [name] is a 70 year old woman who presented with a small non-Q  wave myocardial infarction.  Her hospital course has been complicated by a large retroperitoneal bleed secondary to Lovenox therapy.  She was transfused several  units of packed red blood cells and had transient renal insufficiency.  All of these problems have since stabilized.  She was thus referred for a cardiac catheterization to determine the etiology of her non-Q wave myocardial infarction.  PROCEDURAL NOTE:  A 6-French sheath was placed in the left femoral artery. Standard Judkins 6-French catheters were utilized.  Contrast was Omnipaque. There were no complications.  RESULTS:  HEMODYNAMICS:  Left ventricular pressure was 152/14, aortic pressure 155/75. There is no aortic valve gradient.  LEFT VENTRICULOGRAPHY:  There is mild hypokinesis of the basal inferior wall, otherwise, normal wall motion.  Ejection fraction is estimated at 65%.  CORONARY ARTERIOGRAPHY (Right dominant): 1. The left main is normal.  2. The left anterior descending artery has a smooth 20% stenosis in the mid vessel    after the origin of the large first diagonal branch.  The LAD gives rise to  single large diagonal.  3. The left circumflex gives rise to a large OM-1, small OM-2, and a small OM-3.    The left circumflex is free of angiographic disease.  4. The right coronary artery is a large dominant vessel.  It has a 25% stenosis  near its origin and  then a 25% stenosis in the proximal vessel.  In the mid    vessel is a focal somewhat hypodense 50% to 60% stenosis.  The distal right    coronary gives rise to a normal sized posterior descending artery, a small first    posterolateral branch, a normal second posterolateral branch, and a small third    posterolateral branch.  IMPRESSIONS: 1. Preserved left ventricular systolic function. 2. Moderate one-vessel coronary artery disease, as described, with a 50% to 60%    stenosis in the mid right coronary artery.  This is most likely the culprit    lesion and possibly may represent a ruptured plaque which has healed.  RECOMMENDATIONS:  In light of the patients recent large retroperitoneal bleed and risk of subsequent bleeding if she is given additional anticoagulation, I would  recommend medical therapy given the moderate nature of the stenosis in the right coronary artery.  I would consider obtaining a stress Cardiolite after several weeks to determine whether or not there is any ischemia in the inferior wall. f so, one could consider elective intervention at that time. DD:  11/18/99 TD:  11/18/99 Job: [id number]

## 2011-03-17 NOTE — Discharge Summary (Signed)
[place]. [facility]  Patient:    [name], Lane Visit Number: [id number] MRN: [id number]          Service Type: MED Location: [phone] Attending Physician:  Ronaldo Miyamoto Dictated by:   Joellyn Rued, P.A.-C. Admit Date:  02/13/2002 Disc. Date: 02/18/02   CC:         [facility]   Referring Physician Discharge [place]  DATE OF BIRTH:  1941/01/23  SUMMARY OF HISTORY:  [name] Lane is a 70 year old female who presents to [facility] emergency room complaining of substernal chest discomfort radiating into both arms.  It has worsened over the preceding two weeks.  She also notices some fatigue but she denies any other associated symptoms.  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  Catheterization at that time showed a mid LAD of 20%; 25% proximal RCA, 50% mid lesion; normal ejection fraction.  A Cardiolite in August 2002 showed probable inferior scarring with an EF of 76%. History of retroperitoneal bleed associated with catheterization in 2001 while on Lovenox and heparin.  History of hyperlipidemia and treated asthma.  LABORATORY DATA:  CKs and troponins were negative for myocardial infarction. Fasting cholesterol showed total cholesterol 174, triglycerides 227, HDL 40, LDL 89.  Sodium 142, potassium 3.3, BUN 17, creatinine 1.1, glucose 162, normal LFTs.  H&H 13.4 and 38.7, normal indices, platelets 230, wbcs 7.4. PT 12.7, PTT 21.  Glucose on admission was slightly elevated at 161.  Chest x-ray did not show any active disease.  EKG showed normal sinus rhythm.  HOSPITAL COURSE:  [name] Lane was admitted to the unit 2000.  Dr. Riley Kill noted no anticoagulant unless PCI, then short-term Angiomax.  Overnight she did not have any further chest discomfort.  On April 21 Dr. Riley Kill performed cardiac catheterization.  This showed an 80% mid RCA, an 80% mid LAD.  Both lesions were angioplasty stented to 0%.  EF was 70%.   Post sheath removal and bedrest she did well.  Catheterization site was intact.  On April 22 after reviewing the chart, H&H was 12.2 and 35.1. BUN and creatinine were 11 and 1.2, glucose 156, and he felt that she could be discharged home.  DISCHARGE DIAGNOSES: 1. Unstable angina. 2. Progressive coronary artery disease status post angioplasty stenting of the    mid right coronary artery and left anterior descending artery as previously    described. 3. Hypokalemia. 4. Hyperlipidemia. 5. Hyperglycemia. 6. History as previously.  DISPOSITION:  She is discharged home.  MEDICATIONS: 1. Her new prescriptions include:    a. Plavix 75 mg q.d. for four weeks.    b. K-Dur 10 mEq q.d. 2. She was asked to continue:    a. Altace 5 mg q.d.    b. Baby aspirin 81 mg q.d.    c. Atenolol 25 mg one-half tablet b.i.d.    d. Guaifenesin 1200 mg q.d.    e. Prevacid 30 mg q.d.    f. HCTZ 25 mg q.d.    g. Lipitor 40 mg q.h.s.    h. Advair 250/50 one puff b.i.d.    i. Sublingual nitroglycerin as needed.  ACTIVITY:  She was advised no lifting, driving, sexual activity, or heavy work for two days.  DIET:  Maintain low salt/fat/cholesterol diet.  WOUND CARE:  If she had any problems with her catheterization site she was asked to call immediately.  FOLLOW-UP:  She will see Dr. Nilda Simmer. on Monday, Mar 10, 2002 at 9:15 a.m.  Consideration at that time should be evaluating for possible diabetes with elevated blood sugars during this hospitalization.  It was noted that a hemoglobin A1c was not checked during this hospitalization.  Also should consider readjusting her lipid treatment in regards to the lipid panel mentioned above.  Also consider checking BMP to reevaluate her potassium. Dictated by:   Joellyn Rued, P.A.-C. Attending Physician:  Ronaldo Miyamoto DD:  02/18/02 TD:  02/18/02 Job: [id number]

## 2011-03-17 NOTE — H&P (Signed)
[place]. [facility]  Patient:    [name] Lane, [name] Lane Visit Number: [id number] MRN: [id number]          Service Type: MED Location: 2000 2030 01 Attending Physician:  Ronaldo Miyamoto Dictated by:   Guy Franco, P.A. Admit Date:  02/13/2002                           History and Physical  DATE OF BIRTH:  30-Aug-1941  HISTORY OF PRESENT ILLNESS:  The patient is a 70 year old female with a known history of coronary artery disease.  She experienced a non-Q-wave MI in January of 2001.  Cath at that time revealed a mid-LAD 20% lesion, left main normal, circumflex okay, right coronary artery disease with a 25% stenosis at the origin, followed by a 20% proximal and 50% mid lesion.  She had a normal ejection fraction at that time.  Her last nuclear study in August of 2002 revealed a probable inferior infarct with an EF of 76%.  Now she presents with substernal chest pain and bilateral arm pain which has worsened over the last two weeks.  She feels fatigued but denies any palpitations or shortness of breath or syncope.  PAST MEDICAL HISTORY:  CAD, as above.  She did have a history of a retroperitoneal bleed in January of 2001 while she was on Lovenox; she is to avoid Lovenox and heparin.  History of hyperlipidemia, treated.  History of asthma, treated.  SOCIAL HISTORY:  No tobacco, alcohol or illicit drug use.  Remains on a low-fat diet and lives in [place].  ALLERGIES:  She is unable to take ANY TYPE OF BLOOD THINNER INCLUDING LOVENOX AND HEPARIN OR TPA; this is secondary to a retroperitoneal bleed in 2001.  MEDICATIONS: 1. Altace 5 mg a day. 2. Baby aspirin 81 mg a day. 3. Atenolol 25 mg one-half tablet p.o. b.i.d. 4. Guaifenesin 1200 mg a day. 5. Prevacid 30 mg a day. 6. Hydrochlorothiazide 25 mg a day. 7. Lipitor 40 mg a day. 8. Advair inhaler 250/50 one inhalation b.i.d.  PHYSICAL EXAMINATION:  VITAL SIGNS:  She is afebrile.  Pulse 75,  respirations 20, blood pressure 133/88.  Weight 198.  HEENT:  Grossly normal.  NECK:  No carotid or subclavian bruits.  No JVD or thyromegaly.  CHEST:  Clear to auscultation bilaterally.  No wheezing or rhonchi.  HEART:  Regular rate and rhythm.  No gross murmur, rub or ectopy.  ABDOMEN:  Obese.  Good bowel sounds.  Nontender, nondistended.  No masses.  No bruits.  EXTREMITIES:  She has no lower extremity bruits, no lower extremity edema, palpable PT and DP pulses bilaterally.  NEUROLOGIC:  Cranial nerves II-XII grossly intact.  LABORATORY AND ACCESSORY DATA:  Her EKG reveals normal sinus rhythm, rate 75, with nonspecific ST-T wave changes in the inferolateral leads, somewhat more pronounced than her last EKG in August of 2002.  ASSESSMENT AND PLAN: 1. Unstable angina. 2. Coronary artery disease with history of non-Q-wave myocardial infarction,    2001. 3. History of right retroperitoneal bleed on Lovenox. 4. Hyperlipidemia, treated.  The patient will be admitted to [facility], placed on intravenous nitroglycerin and Plavix 75 mg a day.  She is not to be placed on intravenous heparin, Lovenox, IIb/IIIa or any type of TPA secondary to history of retroperitoneal bleed.  We will perform serial cardiac isoenzymes and routine lab work.  She is scheduled for cardiac catheterization on  February 13, 2002. Dictated by:   Guy Franco, P.A. Attending Physician:  Ronaldo Miyamoto DD:  02/13/02 TD:  02/14/02 Job: [id number]

## 2011-03-17 NOTE — Assessment & Plan Note (Signed)
[facility]                            CARDIOLOGY OFFICE NOTE   NAME:[name] Lane, [name] Lane                      MRN:          [id number]  DATE:02/07/2007                            DOB:          1941-01-29    [name] Lane is in for followup.  Cardiac-wise, she has been stable.  However, she has had recurrent episodes of pneumonia and bronchitis.  She has been having some shortness of breath.  Most of the chest pain  that she has had has been related to her asthma.  We do not have access  to her x-rays, but she was told that she had pneumonia.  Unfortunately,  she also had significant improvement after the administration of Lasix.  She has not had significant left ventricular dysfunction noted  previously.  Her EF was greater than 70% at last catheterization in  2004.   Today, her weight is 196 pounds.  Blood pressure 140/70, and a pulse of  79.  There are some mild expiratory wheezes noted throughout the lung fields.  Cardiac rhythm is regular.  There is not a significant murmur.  No extremity edema is noted.  The jugular veins are not distended.   Electrocardiogram demonstrates normal sinus rhythm within normal limits.   IMPRESSION:  1. Prior myocardial infarction treated with lytics and prior stenting      of both the left anterior descending and right coronary artery with      50% to 70% narrowing in the left anterior descending.  2. Previous normal left ventricular function by ventriculography.  3. Recent recurrent bronchitis, and asthma with underlying mild to      moderate chronic obstructive pulmonary disease.  4. Hyperlipidemia.  On lipid lowering management.  5. 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  6. Coronary artery disease, on antiplatelet therapy.   PLAN:  1. A 2D echocardiogram.  2. Return to clinic in 1 year.  3. Pulmonary consult.  4. Return to Cardiology sooner if there is left ventricular      dysfunction.  5. Good blood  pressure control.  6. Continued followup in her primary care clinic with followup labs as      have been previously done.    Arturo Morton. Riley Kill, MD, [facility]  Electronically Signed   TDS/MedQ  DD: 02/07/2007  DT: 02/07/2007  Job #: [id number]   cc:   Nonda Lou, FNP

## 2011-03-17 NOTE — Discharge Summary (Signed)
NAME:  [name] Lane, [name] Lane                       ACCOUNT NO.:  [id number]   MEDICAL RECORD NO.:  [id number]                   PATIENT TYPE:  OIB   LOCATION:  6529                                 FACILITY:  [facility]   PHYSICIAN:  Arturo Morton. Riley Kill, M.D. [facility]         DATE OF BIRTH:  10/31/1940   DATE OF ADMISSION:  02/11/2003  DATE OF DISCHARGE:  02/12/2003                           DISCHARGE SUMMARY - REFERRING   HOSPITAL COURSE:  The patient is a 70 year old white female who was seen in  the office on 02/05/2003 by Guy Franco and Dr. Riley Kill.  She came in for  reevaluation after a syncopal episode in mid March 2004.  She stated she was  standing at her bathroom sink, putting on her make-up, she felt lightheaded,  and then passed out.  She denied any associated palpitations or chest  discomfort; however, after she got up off the floor, she began noticing some  chest discomfort.  She had started her isosorbide about two days prior to  the syncopal episode.  She had, prior to that, run out of it for a few days,  at which time she did have some chest discomfort.  Since the syncopal  episode, she has had intermittent chest discomfort and shortness of breath  with minimal exertion.  She was hospitalized at [facility] overnight  and ruled out for myocardial infarction.  She was noted to have some  hypokalemia, which was replaced.  She has a known history of coronary artery  disease with an angioplasty stent to the LAD and RCA.  In July of 2003, she  underwent cutting balloon angioplasty to the LAD.  She also has a history of  asthma, hyperlipidemia, diabetes, spontaneous retroperitoneal bleed  associated with Lovenox prior to heart catheterization.   LABORATORY DATA:  Preadmission H&H was 13.3 and 39.1, normal indices,  platelets of 364, WBC was 8.9, PTT 34.9, PT 12.6, sodium 140, potassium 3.6,  BUN 15, creatinine 1.1, glucose 137.   HOSPITAL COURSE:  The patient underwent cardiac  catheterization on  02/11/2003 by Dr. Riley Kill; according to Dr. Rosalyn Charters progress note, she had  an EF of 70%, ectopy-related mitral regurgitation, 30% end-stent RCA  restenosis, a 50 to 70% end-stent restenosis of the LAD.  Post sheath,  immobile, on bedrest, she was ambulating without difficulty.  Catheterization site was intact.   On 02/12/2003, after review, Dr. Riley Kill felt that at this time we can  continue medical treatment and to discuss further options when she is seen  back in the office.  On 02/12/2003, H&H was 12.6 and 36.7, sodium 140,  potassium 3.4, BUN 12, creatinine 1.1, glucose 145.   DISCHARGE DIAGNOSES:  1. Unstable angina.  2. Syncope.  3. Progressive coronary artery disease, as previously described.  4. Hypokalemia (history is previously).   DISPOSITION:  She was discharged home.  She was asked to resume her  Glucophage 500 mg q.d. on Saturday morning.  She  was asked to continue her  Altace 5 mg q.d., HCTZ 25 mg q.d., Imdur 30 mg q.d., Glipizide 5 mg two  tablets q.d., atenolol 25 mg one-half tablet b.i.d., baby aspirin 81 mg  q.d., Advair 250/50 mcg one puff b.i.d., Lipitor 40 mg q.h.s., Prevacid 30  mg q.d., and nitroglycerin as needed.  She was advised no lifting, driving,  sexual activity, or heavy exertion for two days, maintain low-salt, -fat, -  cholesterol ADA diet.  If she has any problems with her catheterization  site, she was asked to call.  She will see Dr. Riley Kill on the 4th at 10:30  a.m.  At followup visit with Dr. Riley Kill, Dr. Riley Kill will review options in  regards to her progressive coronary artery disease.  Consideration  should be given to rechecking a BMP in regards to her hypokalemia.  Consideration should also be given to placing her on a regular-dose  potassium since she is on HCTZ.  Of course, at followup, cardiac risk-factor  modifications and review of lipid status should be pursued.     Joellyn Rued, P.A. [facility]                     Thomas D. Riley Kill, M.D. [facility]    EW/MEDQ  D:  02/12/2003  T:  02/12/2003  Job:  [id number]   cc:   Arturo Morton. Riley Kill, M.D. [facility]   Dr. Colbert Ewing  [facility]  [place], [place]

## 2011-03-17 NOTE — Cardiovascular Report (Signed)
[place]. [facility]  Patient:    BRAZIL, VOYTKO Visit Number: [id number] MRN: [id number]          Service Type: CAT Location: 6500 [id number] Attending Physician:  Lenoria Farrier Dictated by:   Arturo Morton Riley Kill, M.D. [facility] Proc. Date: 04/29/02 Admit Date:  04/29/2002   CC:         CV Laboratory   Cardiac Catheterization  INDICATIONS: The patient is a delightful 70 year old woman with a history of known coronary artery disease. She initially presented in 2001 and at that time had a non-ST elevation MI. She developed a retroperitoneal bleeding spontaneously with the use of Lovenox. Subsequent to that, she did well but then earlier this year developed recurrent anginal type symptoms resulting in repeat catheterization. This revealed high-grade disease of the large right coronary artery and a smaller LAD after the origin of the septal and diagonal. Using Angiomax, we successfully stented the LAD and the right coronary artery. She recently has developed recurrence of typical symptoms for her. She said this is very much like what she has experienced in the past. The current study was done to assess coronary anatomy.  PROCEDURES: 1. Left heart catheterization. 2. Selective coronary arteriography. 3. Selective left ventriculography. 4. Percutaneous Cutting Balloon angioplasty of the left anterior descending    artery.  DESCRIPTION OF PROCEDURE: The patient was brought to the catheterization lab and prepped and draped in the usual fashion. Through an anterior puncture, the right femoral artery was carefully entered. A 6 French sheath was placed. Diagnostic views of the left and right coronary arteries were obtained. Ventriculography was performed in the RAO projection. There did not appear to be significant re-stenosis in the RCA territory. There was in-stent re-stenosis in the LAD territory but it was very focal and the stent itself was an 8 mm  stent. The area of narrowing appeared to be less than about 6 mm in size and therefore we discussed the possible options. At the current time, use of drug-eluting stent in site of previously placed stent is poorly studies. Other options include minicab, although she is only 2-1/2 months out from the previous dilatation, and we are unsure whether the right will hold up on a long-term basis. Radiation is a possibility but the lesion is short in length and therefore we elected to recommend a Cutting Balloon angioplasty of the in-stent re-stenosis. This was explained to the patient and subsequently to her family and she was agreeable with this. Preparations were then made for angioplasty. She was given four baby aspirins. In addition, Integrilin was administered. A modified bolus of heparin was given and an ACT checked and found to be 252. This was at the beginning of the procedure. At the completion of the procedure, the ACT was 220. No additional heparin was given. The JL4, 7 [place] guiding catheter was utilized to intubate the left main. The lesion was crossed with a 0.014 BMW wire. The stent was redilated x3 using a 2.5 x 6 Cutting Balloon and there was marked improvement in appearance of the artery, although there was some narrowing distal to the stent, it was felt to likely represent spasm. Intracoronary nitroglycerin was then subsequently given, and then this improved. The wire was eventually removed and I elected not to perform further dilatations.  The femoral sheath was subsequently sewn into place after removal of all catheters, and the patient was taken to the holding area in satisfactory clinical condition with an ACT of approximately  220 seconds.  HEMODYNAMIC DATA: 1. Central aortic pressure was 146/73. 2. Left ventricular pressure was 129/16. 3. There was no gradient on pullback across the aortic valve.  ANGIOGRAPHIC DATA: 1. Left ventriculography was performed in the RAO  projection. Overall,    systolic function was well preserved and no segmental abnormalities    contraction were identified. Ejection fraction was calculated at 63%. 2. The left main coronary artery was free of significant disease. 3. The left anterior descending artery has about 10% narrowing right at the    ostium. Distal to this, it opens up into a large diagonal and septal    perforator and just beyond this is a previously placed stent that    demonstrates about 80% in-stent re-stenosis. This is an 8 mm length    2.25 mm stent. The distal vessel has minimal luminal irregularities with    some mild plaquing just beyond the stent placement, but the vessel opens    up widely in the distal portion. Following balloon dilatation with the    2.5 mm Cutting Balloon, the area of narrowing was less than 20%. There    remained about 30-40% residual narrowing just distal to the stent that    is previously noted. 4. The circumflex provides a marginal branch in the AV circumflex, both of    which are free of critical disease. 5. The right coronary artery is a fairly large caliber vessel. A 3.5 mm stent    was previously placed in this location and demonstrates less than 20%    narrowing at the superior aspect of the stent leading into the stent. The    stent itself has no significant re-stenosis There is minimal luminal    irregularity distally and the vessel is a large caliber vessel.  CONCLUSIONS: 1. Preserved left ventricular function with normal systolic function. 2. No evidence of significant re-stenosis at the previously placed large    right coronary artery stent site. 3. In-stent focal re-stenosis at the previously placed 2.25 mm stent in the    midportion of the left anterior descending artery with successful    repeat balloon dilatation.  DISPOSITION: The patient will be followed closely. She has a fairly reliable historical pattern. Should she develop re-stenosis, then it might  be reasonable to consider a drug-eluting stent placed in a previously placed  stent. Data on this approach has been fairly limited to date. We could also consider radiation therapy but the vessel itself is fairly small. Finally, a minicab could be considered, and if she were to develop a recurrent restenosis then we probably would have more data on the long-term patency of the right coronary artery at that time to make that final decision. With the small MLD, the risk of ischemic target vessel revascularization due to in-stent re-stenosis remains at least moderate. Followup will be with me. Dictated by:   Arturo Morton Riley Kill, M.D. [facility] Attending Physician:  Lenoria Farrier DD:  04/29/02 TD:  04/30/02 Job: [id number]

## 2011-03-17 NOTE — Discharge Summary (Signed)
[place]. [facility]  Patient:    [name] Lane                     MRN: [id number] Adm. Date:  [id number] Disc. Date: 11/20/99 Attending:  Ronaldo Miyamoto Dictator:   Leonides Cave, P.A. CC:         Shawnie Pons, M.D., [facility]             Dr. Sanda Klein, Primary Care                           Discharge Summary  DISCHARGE DIAGNOSES: 1. Coronary artery disease, status post non-Q wave myocardial infarction (medical    therapy). 2. Severe asthma/COPD. 3. Status post retroperitoneal bleed while in hospital with hemoglobin stable at    time of discharge, hemoglobin 12.2. 4. Elevated white count.  Patient has completed a 10 day course of p.o. Levaquin.  BRIEF HISTORY:  Patient transferred from [facility] on 11/09/99 secondary o a non-Q wave myocardial infarction.   Patient states that since 10/26/99, she has  developed cold and flu-like symptoms with increased shortness of breath and nausea and a productive cough.  She home nebulizers and this was her only relief.  On he Wednesday before admission, she developed aches across her neck, shoulders, and  lower back and she continued to work; however, Saturday night after work, she became worse and went to the hospital and was admitted.  She was transferred on  11/09/99 for pulmonary consult and heart catheterization.  HOSPITAL COURSE:  Pulmonary consult was obtained on 11/09/99.   Patient was started on Levaquin.  Patients Theo-Dur was discontinued and she was treated with steroids, Humibid DM, and nebulizers.  On 11/10/99, she had a 2-D echocardiogram  performed, which revealed normal LV and normal LV function.  There was posterior wall hypokinesis and apical hypokinesis.   On 11/11/99, the patient started having severe right flank pain.  An abdominopelvic CT scan was ordered to rule out retroperitoneal bleed.  Patient also had a bout of hypotension and decreased heart rate.  CT scan report came  back with massive retroperitoneal bleed.  Patient was then transferred to the CCU on 11/11/99 and was given fluids and four units of packed red blood cells.  Patients anticoagulation/Lovenox was held.  Patient was also given protamine.  Patient was treated with pain measures over the next couple of days and patient responded very well considering her retroperitoneal bleed. She was given an additional unit of packed red blood cells on 11/15/99 as her hemoglobin was 8.8.   She was able to transfer out of the critical care unit on 11/16/99 and on 11/18/99, she underwent heart catheterization with Dr. Loraine Leriche Pulsipher.  The results are a left main normal, LAD with mid 20% lesion, left circumflex normal, RCA with two proximal 25% lesions, and a mid 50-60% lesion.  Patient had normal LV function on catheterization and overall impression was moderate one vessel coronary artery disease.  Recommended treatment was continued medical therapy.   Patient did very well over the next couple of days and was discharged home on Sunday, 11/20/99 in  stable condition.  MEDICATIONS AT TIME OF DISCHARGE: 1. Humibid DM 2 tablets b.i.d. 2. Coated aspirin q. d. 3. Prevacid 30 mg q. d. 4. Atenolol 25 mg 1/2 tablet b.i.d.  She was told to continue her home neubulizers, Atrovent/albuterol as before and she was given six day course  of prednisone 20 mg q. d. x 3 days and then 10 mg q. d. x 3 days.  She was instructed not to drive until Dr. Riley Kill sees her in follow-up. She was told to continue a low fat, low cholesterol diet.  She was given instructions that she should see Dr. Riley Kill in 10 days and this appointment will be arranged through the office.  On the day of discharge, patients hemoglobin was 12.2, hematocrit 35.9, platelet count 377,000, and WBC 16.6. DD:  11/20/99 TD:  11/20/99 Job: [id number]

## 2011-03-17 NOTE — Discharge Summary (Signed)
[place]. [facility]  Patient:    [name] Lane, [name] Lane Visit Number: [id number] MRN: [id number]          Service Type: CAT Location: [id number] Attending Physician:  Lenoria Farrier Dictated by:   Joellyn Rued, P.A.-C. Admit Date:  04/29/2002 Discharge Date: 04/30/2002                    Referring Physician Discharge [place]  DATE OF BIRTH:  08-05-1941  ADMITTING PHYSICIAN:  Dr. Juanda Chance.  DISCHARGING PHYSICIAN:  Dr. Riley Kill.  SUMMARY OF HISTORY:  [name] Lane is a 70 year old white female, who was seen in the office on April 22, 2002, by Guy Franco with a chief complaint of exertional chest discomfort.  She denied any rest or nocturnal discomfort. She felt that the discomfort was similar to the discomfort she has had in the past, when she was hospitalized with a non-Q-wave myocardial infarction in January 2001.  At that time, she was treated medically postcatheterization. It was noted at that time she did develop retroperitoneal bleed.  Also, she was admitted with unstable angina in April 2003, and was found to have an 80% mid, 80% right lesion, and both of those areas were stented without complications.  EF was 70%.  Her history is notable for an ALLERGY:  [employer].  She states she cannot take any type of blood thinner including LOVENOX, HEPARIN, OR T-PA.  Retroperitoneal bleed in 2001, hyperlipidemia, obesity, asthma, as well as non-insulin dependent diabetes.  LABORATORY DATA:  Preadmission H&H was 13.1 and 37.8, normal indices, platelets 279, WBC 7.8.  PT 11.1, PTT 22.  Sodium 139, potassium 3.5, BUN 18, creatinine 1.8, glucose 123.  EKG showed normal sinus rhythm, nonspecific ST-T wave changes, and artifact.  Postprocedure, sodium was 142, potassium 3.6, BUN 17, creatinine 1.2, glucose 139, H&H 12.2 and 36.0, normal indices, platelets 221, WBC 8.2.  EKG did not show any acute changes.  HOSPITAL COURSE:  [name] Lane was brought in, and  she underwent cardiac catheterization by Dr. Riley Kill.  According to his progress note, her RCA at the prior stent site had a 20% restenosis in the proximal portion of the stent.  Her LAD had a 10% proximal lesion and at the prior stent in the LAD, she had 80% restenosis.  Utilizing a cutting balloon, this was reduced to less than 20%.  It was also noted that she had a 30% lesion in the LAD just distal to her stent.  It was noted during the procedure, she did have some distal LAD spasm which was relieved with intracoronary nitroglycerin.  Her ejection fraction was 63%.  Dr. Riley Kill notes that the stent used was of small caliber. Post sheath removal and bed rest, she was ambulating without difficulty. Catheterization site was intact.  It was noted postprocedure there was error on the lab, and initially her hemoglobin was reported at 6.1.  However, this was rechecked; it was 12.1.  On April 30, 2002, after review from Dr. Riley Kill, it was felt that the patient could be discharged home.  It was noted prior to discharge, I had Dr. Riley Kill review her lipid panel from May 2003, because it appeared that it had not been addressed in the office.  At that time, her cholesterol was 174, triglycerides 227, HDL 40, LDL 89.  Dr. Riley Kill stated that he did not want to make any further adjustments on her lipid medications in regard to her high triglycerides.  DISCHARGE  DIAGNOSES: 1. Unstable angina. 2. Hyperlipidemia. 3. Status post angioplasty for restenosis of the left anterior descending    stent, as previously described. 4. History of asthma and diabetes.  DISPOSITION:  She is discharged home.  DISCHARGE MEDICATIONS:  1. She was asked to continue her home medications which include coated     aspirin 325 q.d.  2. Altace 5 mg q.d.  3. Atenolol 25 mg 1/2 tab b.i.d.  4. Prevacid 30 mg q.d.  5. Lipitor 40 mg q.h.s.  6. Imdur 30 mg q.d.  7. Guaifenesin 1200 mg b.i.d.  8. Advair Diskus 250/50, 1 puff  b.i.d.  9. Glucotrol XL 10 mg q.d. 10. Sublingual nitroglycerin p.r.n. 11. She was asked to resume her Glucophage 500 mg q.d. on Friday.  DISCHARGE INSTRUCTIONS: 1. She was advised no lifting, driving, sexual activity, or heavy exertion for    two days. 2. She was given permission to return to work on Monday. 3. She was asked to follow a low salt, fat, cholesterol, ADA diet, and she was    encouraged to avoid carbohydrates and starches such as bread and potatoes    which will her sugars and her hypertriglyceridemia. 4. She was asked to observe her catheterization site for problems and to call. 5. She will follow up with [facility] in regard to her sugar. 6. She will see Dr. Riley Kill in the office on May 15, 2002, at 12:15 p.m. Dictated by:   Joellyn Rued, P.A.-C. Attending Physician:  Lenoria Farrier DD:  04/30/02 TD:  04/30/02 Job: [id number]

## 2011-03-23 ENCOUNTER — Encounter: Payer: Self-pay | Admitting: Pulmonary Disease

## 2011-03-27 ENCOUNTER — Other Ambulatory Visit: Payer: Self-pay | Admitting: Pulmonary Disease

## 2011-03-27 ENCOUNTER — Other Ambulatory Visit: Payer: Self-pay | Admitting: Cardiology

## 2011-03-29 ENCOUNTER — Telehealth: Payer: Self-pay | Admitting: Cardiology

## 2011-03-29 NOTE — Telephone Encounter (Signed)
Echo faxed to [facility] @ [phone]  03/29/11/km

## 2011-04-11 ENCOUNTER — Telehealth: Payer: Self-pay | Admitting: Pulmonary Disease

## 2011-04-11 MED ORDER — ROFLUMILAST 500 MCG PO TABS: 500.0000 ug | ORAL_TABLET | Freq: Every day | ORAL | Status: DC

## 2011-04-11 NOTE — Telephone Encounter (Signed)
LMTCB

## 2011-04-11 NOTE — Telephone Encounter (Signed)
Called and spoke with pt's son, [name] Lane.  [name] Lane states he is calling back to give an update on pt since starting Daliresp.  RA had started pt on Daliresp on 03/07/11.  Son states she took for 1 week then got sick with n/v/d and so she stopped taking it.  Son states pt got better off the Daliresp but then starting having n/v/d again and come to find out pt was dx with UTI and was hospitalized x 1 week for this.  Son states the n/v/d was coming from the UTI.  Pt recently restarted back on the Daliresp and has been doing "great."  States she feels like a "teenager again."  Denies n/v/d.  Son is requesting an rx be called into [facility] in [place].  RA please advise if ok or not to send.  Please advise. Thanks.

## 2011-04-11 NOTE — Telephone Encounter (Signed)
OK to send.

## 2011-04-11 NOTE — Telephone Encounter (Signed)
[name] Lane phoned he is returning a call is now available at [phone].[name] Lane

## 2011-04-11 NOTE — Telephone Encounter (Signed)
Rx was sent to pharm. LMOM for Jill Alexanders to be made aware.

## 2011-04-12 NOTE — Telephone Encounter (Signed)
This msg was addressed on 04/11/11- rx for daliresp has been sent to pharm and pt was made aware.

## 2011-04-17 ENCOUNTER — Telehealth: Payer: Self-pay | Admitting: Pulmonary Disease

## 2011-04-17 NOTE — Telephone Encounter (Signed)
Received faxed refill request for Metformin 1000 mg. Per RA faxed back to pharmacy [facility] in [place] to have PCP refill.

## 2011-04-25 ENCOUNTER — Encounter: Payer: Self-pay | Admitting: *Deleted

## 2011-04-26 ENCOUNTER — Encounter: Payer: Self-pay | Admitting: Cardiology

## 2011-04-26 ENCOUNTER — Ambulatory Visit (INDEPENDENT_AMBULATORY_CARE_PROVIDER_SITE_OTHER): Payer: Medicare Other | Admitting: Cardiology

## 2011-04-26 DIAGNOSIS — J449 Chronic obstructive pulmonary disease, unspecified: Secondary | ICD-10-CM

## 2011-04-26 DIAGNOSIS — E7800 Pure hypercholesterolemia, unspecified: Secondary | ICD-10-CM

## 2011-04-26 DIAGNOSIS — I2510 Atherosclerotic heart disease of native coronary artery without angina pectoris: Secondary | ICD-10-CM

## 2011-04-26 DIAGNOSIS — J4489 Other specified chronic obstructive pulmonary disease: Secondary | ICD-10-CM

## 2011-04-26 DIAGNOSIS — I10 Essential (primary) hypertension: Secondary | ICD-10-CM

## 2011-04-26 MED ORDER — PRAVASTATIN SODIUM 40 MG PO TABS: 40.0000 mg | ORAL_TABLET | Freq: Every day | ORAL | Status: DC

## 2011-04-26 NOTE — Patient Instructions (Addendum)
Your physician has recommended you make the following change in your medication:  1) Stop lipitor 2) Start Pravachol 40mg  one tablet daily. 3) Stop Isosorbide (Imdur).  Your physician recommends that you have FASTING lab work in: 2 months at your primary care doctor- you have been given a prescription to have this done: lipid/liver (414.01;272.0).  Your physician wants you to follow-up in: 4 months. You will receive a reminder letter in the mail two months in advance. If you don't receive a letter, please call our office to schedule the follow-up appointment.

## 2011-05-04 ENCOUNTER — Telehealth: Payer: Self-pay | Admitting: Pulmonary Disease

## 2011-05-04 NOTE — Telephone Encounter (Signed)
Daliresp APPROVED. Pt and pharmacy have been notified. PA # [phone]. Member ID # [id number].

## 2011-05-04 NOTE — Progress Notes (Signed)
HPI:  She is brought in by her son.  She is stable.  She still has shortness of breath and some wheezing, but she is under the care of pulmonary, and holding her own. Not specifically much in the way of chest pain.  Current Outpatient Prescriptions  Medication Sig Dispense Refill  . albuterol (PROVENTIL) (2.5 MG/3ML) 0.083% nebulizer solution Take 2.5 mg by nebulization every 6 (six) hours as needed.        Marland Kitchen amLODipine (NORVASC) 5 MG tablet Take 1 tablet (5 mg total) by mouth daily.  30 tablet  11  . aspirin 81 MG tablet Take 81 mg by mouth daily.        . budesonide (PULMICORT) 0.5 MG/2ML nebulizer solution INHALE ONE AMPULE IN NEBULIZER TWICE DAILY  120 mL  PRN  . cetirizine (ZYRTEC) 10 MG tablet Take 10 mg by mouth daily.        Marland Kitchen dexlansoprazole (DEXILANT) 60 MG capsule Take 60 mg by mouth daily.        Marland Kitchen FLUoxetine (PROZAC) 20 MG tablet Take 20 mg by mouth 2 (two) times daily.        . furosemide (LASIX) 20 MG tablet Take 20 mg by mouth 2 (two) times daily. If patient gains more than 2 pounds in a day she may take a third tablet      . insulin glargine (LANTUS) 100 UNIT/ML injection Inject 14 Units into the skin at bedtime. And 10 units in the morning      . insulin lispro (HUMALOG) 100 UNIT/ML injection Inject into the skin. Sliding scale with each meal       . KLOR-CON 10 10 MEQ CR tablet TAKE 1 TABLET BY MOUTH EVERY DAY  30 tablet  0  . LORazepam (ATIVAN) 1 MG tablet Take 1 mg by mouth 2 (two) times daily as needed.        . nebivolol (BYSTOLIC) 5 MG tablet Take 5 mg by mouth daily.        . predniSONE (DELTASONE) 5 MG tablet Take 5 mg by mouth daily.        . pseudoephedrine-dextromethorphan-guaifenesin (ROBITUSSIN-PE) 30-10-100 MG/5ML solution Take 10 mLs by mouth as needed.        . ranitidine (ZANTAC) 150 MG tablet Take 150 mg by mouth at bedtime as needed.        Marland Kitchen SPIRIVA HANDIHALER 18 MCG inhalation capsule INHALE CONTENTS OF ONE CAPSULE ONCE DAILY USING HANDIHALER  30 capsule  6    . metFORMIN (GLUMETZA) 1000 MG (MOD) 24 hr tablet Take 1,000 mg by mouth 2 (two) times daily with a meal.        . pravastatin (PRAVACHOL) 40 MG tablet Take 1 tablet (40 mg total) by mouth daily.  30 tablet  6  . roflumilast (DALIRESP) 500 MCG TABS tablet Take 1 tablet (500 mcg total) by mouth daily.  30 tablet  6    Allergies  Allergen Reactions  . Atorvastatin     REACTION: muscle aches  . Benzonatate     REACTION: confusion    Past Medical History  Diagnosis Date  . Unspecified essential hypertension   . Pure hypercholesterolemia   . Other symptoms involving cardiovascular system   . Acute myocardial infarction, unspecified site, episode of care unspecified   . Chronic airway obstruction, not elsewhere classified   . Anxiety state, unspecified   . Type II or unspecified type diabetes mellitus without mention of complication, not stated as uncontrolled   .  Unspecified asthma   . Obstructive sleep apnea   . Edema   . GE reflux   . Coronary artery disease   . Carotid bruit   . Anxiety     Past Surgical History  Procedure Date  . Appendectomy     Family History  Problem Relation Age of Onset  . Breast cancer Mother   . Heart disease      History   Social History  . Marital Status: Married    Spouse Name: N/A    Number of Children: N/A  . Years of Education: N/A   Occupational History  . Retirede    Social History Main Topics  . Smoking status: Former Smoker    Quit date: 10/31/1999  . Smokeless tobacco: Not on file  . Alcohol Use: Not on file  . Drug Use: Not on file  . Sexually Active: Not on file   Other Topics Concern  . Not on file   Social History Narrative  . No narrative on file    ROS: Please see the HPI.  All other systems reviewed and negative.  PHYSICAL EXAM:  BP 122/74  Pulse 76  Ht 5\' 3"  (1.6 m)  Wt 210 lb 1.9 oz (95.31 kg)  BMI 37.22 kg/m2  General: Well developed, well nourished, in no acute distress. Head:  Normocephalic  and atraumatic. Neck: no JVD Lungs: Clear to auscultation and percussion.  Some prolonged expiration with mild wheezes.  Heart: Normal S1 and S2.  No murmur, rubs or gallops.  Abdomen:  Normal bowel sounds; soft; non tender; no organomegaly Pulses: Pulses normal in all 4 extremities. Extremities: No clubbing or cyanosis. No edema. Neurologic: Alert and oriented x 3.  EKG:  NSR.  WNL.  ASSESSMENT AND PLAN:

## 2011-05-04 NOTE — Assessment & Plan Note (Signed)
Her BP is controlled on the current regimen.  Would not like to change.  Have decided to switch statins, and will have her get follow up labs in two months with her primary care MD.  This will be important.  Atorvastatin stopped.  If LDL not achieved with prava, we could try Crestor.   We could also substitute amlodipine, but this works pretty well for her at present.

## 2011-05-04 NOTE — Assessment & Plan Note (Addendum)
Coronary status remains stable at present.  No new symptoms.  Last cath data reviewed today.  No current evidence of high grade obstruction. In light of this she will stop her isosorbide mononitrate.

## 2011-05-04 NOTE — Assessment & Plan Note (Signed)
Most of her symptoms arise from her probable asthma.  She is under pulmonary supervision, and somewhat better control at the present time.

## 2011-05-30 ENCOUNTER — Other Ambulatory Visit: Payer: Self-pay | Admitting: Pulmonary Disease

## 2011-05-30 ENCOUNTER — Other Ambulatory Visit: Payer: Self-pay | Admitting: Adult Health

## 2011-06-23 ENCOUNTER — Other Ambulatory Visit: Payer: Self-pay | Admitting: Physician Assistant

## 2011-07-11 ENCOUNTER — Encounter: Payer: Self-pay | Admitting: Cardiology

## 2011-08-01 ENCOUNTER — Other Ambulatory Visit: Payer: Self-pay | Admitting: Cardiology

## 2011-09-05 ENCOUNTER — Other Ambulatory Visit: Payer: Self-pay | Admitting: Cardiology

## 2011-09-20 ENCOUNTER — Other Ambulatory Visit: Payer: Self-pay

## 2011-09-20 ENCOUNTER — Other Ambulatory Visit: Payer: Self-pay | Admitting: Physician Assistant

## 2011-10-18 ENCOUNTER — Ambulatory Visit (INDEPENDENT_AMBULATORY_CARE_PROVIDER_SITE_OTHER): Payer: Medicare Other | Admitting: Cardiology

## 2011-10-18 ENCOUNTER — Encounter: Payer: Self-pay | Admitting: Cardiology

## 2011-10-18 DIAGNOSIS — I2510 Atherosclerotic heart disease of native coronary artery without angina pectoris: Secondary | ICD-10-CM

## 2011-10-18 DIAGNOSIS — E7800 Pure hypercholesterolemia, unspecified: Secondary | ICD-10-CM

## 2011-10-18 DIAGNOSIS — I10 Essential (primary) hypertension: Secondary | ICD-10-CM

## 2011-10-18 DIAGNOSIS — J45909 Unspecified asthma, uncomplicated: Secondary | ICD-10-CM

## 2011-10-18 NOTE — Patient Instructions (Signed)
Your physician wants you to follow-up in: 6 MONTHS.  You will receive a reminder letter in the mail two months in advance. If you don't receive a letter, please call our office to schedule the follow-up appointment.  Your physician recommends that you continue on your current medications as directed. Please refer to the Current Medication list given to you today.  

## 2011-10-18 NOTE — Assessment & Plan Note (Signed)
Stable.  No recurrent symptoms.  

## 2011-10-18 NOTE — Progress Notes (Signed)
HPI:  [name] Lane is stable. Her hearing has gotten worse, for unclear reasons.  However, she has not gotten that evaluated.  Her chest on occasion gets a little tight, but she has asthma.  She is being seen closely by Deloris Ping in [facility].  She is now on insulin.  She remains on lipid lowering.    Current Outpatient Prescriptions  Medication Sig Dispense Refill  . albuterol (PROVENTIL) (2.5 MG/3ML) 0.083% nebulizer solution USE ONE AMPULE IN NEBULIZER EVERY 4 HOURS AS NEEDED  450 mL  0  . amLODipine (NORVASC) 5 MG tablet Take 1 tablet (5 mg total) by mouth daily.  30 tablet  11  . aspirin 81 MG tablet Take 81 mg by mouth daily.        . budesonide (PULMICORT) 0.5 MG/2ML nebulizer solution INHALE ONE AMPULE IN NEBULIZER TWICE DAILY  120 mL  PRN  . BYSTOLIC 5 MG tablet TAKE 1 TABLET BY MOUTH DAILY  30 tablet  4  . cetirizine (ZYRTEC) 10 MG tablet Take 10 mg by mouth daily.        Marland Kitchen FLUoxetine (PROZAC) 20 MG tablet Take 20 mg by mouth daily.       . furosemide (LASIX) 20 MG tablet Take 20 mg by mouth 2 (two) times daily. If patient gains more than 2 pounds in a day she may take a third tablet  90 tablet  2  . insulin glargine (LANTUS) 100 UNIT/ML injection Inject 14 Units into the skin at bedtime. And 10 units in the morning      . LORazepam (ATIVAN) 1 MG tablet Take 1 mg by mouth 2 (two) times daily as needed.        . pravastatin (PRAVACHOL) 40 MG tablet Take 1 tablet (40 mg total) by mouth daily.  30 tablet  6  . predniSONE (DELTASONE) 5 MG tablet Take 5 mg by mouth daily.        . pseudoephedrine-dextromethorphan-guaifenesin (ROBITUSSIN-PE) 30-10-100 MG/5ML solution Take 10 mLs by mouth as needed.        . ranitidine (ZANTAC) 150 MG tablet Take 150 mg by mouth at bedtime as needed.        . roflumilast (DALIRESP) 500 MCG TABS tablet Take 1 tablet (500 mcg total) by mouth daily.  30 tablet  6  . SPIRIVA HANDIHALER 18 MCG inhalation capsule INHALE CONTENTS OF ONE CAPSULE ONCE DAILY USING  HANDIHALER  30 capsule  6  . insulin lispro (HUMALOG) 100 UNIT/ML injection Inject into the skin. Sliding scale with each meal       . KLOR-CON 10 10 MEQ CR tablet TAKE 1 TABLET BY MOUTH EVERY DAY  30 tablet  0    Allergies  Allergen Reactions  . Atorvastatin     REACTION: muscle aches  . Benzonatate     REACTION: confusion    Past Medical History  Diagnosis Date  . Unspecified essential hypertension   . Pure hypercholesterolemia   . Other symptoms involving cardiovascular system   . Acute myocardial infarction, unspecified site, episode of care unspecified   . Chronic airway obstruction, not elsewhere classified   . Anxiety state, unspecified   . Type II or unspecified type diabetes mellitus without mention of complication, not stated as uncontrolled   . Unspecified asthma   . Obstructive sleep apnea   . Edema   . GE reflux   . Coronary artery disease   . Carotid bruit   . Anxiety  Past Surgical History  Procedure Date  . Appendectomy     Family History  Problem Relation Age of Onset  . Breast cancer Mother   . Heart disease      History   Social History  . Marital Status: Married    Spouse Name: N/A    Number of Children: N/A  . Years of Education: N/A   Occupational History  . Retirede    Social History Main Topics  . Smoking status: Former Smoker    Quit date: 10/31/1999  . Smokeless tobacco: Not on file  . Alcohol Use: Not on file  . Drug Use: Not on file  . Sexually Active: Not on file   Other Topics Concern  . Not on file   Social History Narrative  . No narrative on file    ROS: Please see the HPI.  All other systems reviewed and negative.  PHYSICAL EXAM:  BP 130/78  Pulse 114  Ht 5\' 3"  (1.6 m)  Wt 93.895 kg (207 lb)  BMI 36.67 kg/m2  General: Well developed, well nourished, in no acute distress. Head:  Normocephalic and atraumatic. Neck: no JVD Lungs: Moderate expiratory wheezing Heart: Normal S1 and S2.  No murmur, rubs  or gallops.  Abdomen:  Normal bowel sounds; soft; non tender; no organomegaly Pulses: Pulses normal in all 4 extremities. Extremities: No clubbing or cyanosis. No edema. Neurologic: Alert and oriented x 3.  EKG:  ST.  Nonspecific T abnormality ASSESSMENT AND PLAN:

## 2011-10-18 NOTE — Assessment & Plan Note (Signed)
Reviewed

## 2011-10-18 NOTE — Assessment & Plan Note (Signed)
Followed with Dr. Vassie Loll.

## 2011-10-18 NOTE — Assessment & Plan Note (Signed)
Getting checked in [facility].

## 2011-10-25 ENCOUNTER — Encounter: Payer: Self-pay | Admitting: Pulmonary Disease

## 2011-10-25 ENCOUNTER — Ambulatory Visit (INDEPENDENT_AMBULATORY_CARE_PROVIDER_SITE_OTHER)
Admission: RE | Admit: 2011-10-25 | Discharge: 2011-10-25 | Disposition: A | Discharge status: Home / Self Care | Payer: Medicare Other | Source: Ambulatory Visit | Attending: Pulmonary Disease | Admitting: Pulmonary Disease

## 2011-10-25 ENCOUNTER — Ambulatory Visit (INDEPENDENT_AMBULATORY_CARE_PROVIDER_SITE_OTHER): Payer: Medicare Other | Admitting: Pulmonary Disease

## 2011-10-25 ENCOUNTER — Other Ambulatory Visit: Payer: Self-pay | Admitting: Physician Assistant

## 2011-10-25 DIAGNOSIS — J441 Chronic obstructive pulmonary disease with (acute) exacerbation: Secondary | ICD-10-CM

## 2011-10-25 DIAGNOSIS — R05 Cough: Secondary | ICD-10-CM

## 2011-10-25 DIAGNOSIS — R059 Cough, unspecified: Secondary | ICD-10-CM

## 2011-10-25 MED ORDER — METHYLPREDNISOLONE ACETATE 80 MG/ML IJ SUSP
120.0000 mg | Freq: Once | INTRAMUSCULAR | Status: AC
  Administered 2011-10-25: 120 mg via INTRAMUSCULAR

## 2011-10-25 MED ORDER — ALBUTEROL SULFATE (2.5 MG/3ML) 0.083% IN NEBU
2.5000 mg | INHALATION_SOLUTION | Freq: Once | RESPIRATORY_TRACT | Status: AC
  Administered 2011-10-25: 2.5 mg via RESPIRATORY_TRACT

## 2011-10-25 NOTE — Assessment & Plan Note (Signed)
depomedrol shot 120 mg IM Albuterol neb now Prednisone 40 mg daily x until seen again - 1 week - then slow taper by 10 mg / week if better Expect sugars to rise  >> will proactively Incresae lantus to 30 units daily Take insulin with each meal levaquin 500 mg x 7 days Start protonix daily for reflux in addition to ranitidine - Unclear if GERD or sinuses are  Contributing. Xray  - no infx

## 2011-10-25 NOTE — Progress Notes (Signed)
  Subjective:    Patient ID: [name] Lane, female    DOB: May 08, 1941, 70 y.o.   MRN: [id number]  HPI PCP: Reather Littler, FNP   70/F, ex- smoker, quit 2001 for FU of COPD/asthma, on steroids since july'11 to 3/12  Worked in a hosiery mill x 12y. Triggers being activity, perfumes, spring allergies (claritin helps) , cold weather etc. Spirometry >> FEv1 48 % c/w severe airway obstruction.  upper airway pseudo wheeze which improves on calming down  Son (EMT) reports severe anxiety, now on ativan  Added spiriva june '11 to improve exercise tolerance - good results  Has a bird- cockatoo, clean cage.  RAST - mildly pos for cat & dog dander  Admitted 12/11 for COPD exacerbation and atypical chest pain w/ neg enzymes, cath >>showed no new lesions, patent stents. Tx w/ steroids, lopressor was changed to bystolic and Advair was changed to pulmicort and spiriva.  4-5 pulses of steroids since march'12   Download 3/10-4/15/12 >> good control of events , leak ++ on full face mask, used son's nasal mask & liked it >>Changed to fixed pressure 12 cm  5/12 >> started daliresp  10/25/2011 c/o productive cough with green/yellow mucus, wheezing, Shortness of Breath increased SOB with any exertion x 4 wks,  CPAP has not been working properly Down to 5 mg pred, burst of pred pack in sep & 09/19/11 She had a coughing bout in the office with upper airway pseudowheeze & BL coarse rhonchi >> given neb & depomedrol IM Sugars running high per son CXR- no infx   Review of Systems Pt denies any significant  nasal congestion or excess secretions, fever, chills, sweats, unintended wt loss, pleuritic or exertional cp, orthopnea pnd or leg swelling.  Pt also denies any obvious fluctuation in symptoms with weather or environmental change or other alleviating or aggravating factors.    Pt denies any increase in rescue therapy over baseline, denies waking up needing it or having early am exacerbations or coughing/wheezing/  or dyspnea      Objective:   Physical Exam  Gen. Pleasant, well-nourished, mild distress distress, normal affect, hard of hearing ENT - no lesions, no post nasal drip Neck: No JVD, no thyromegaly, no carotid bruits Lungs: no use of accessory muscles, no dullness to percussion, upper airway pseudowheeze & BL coarse rhonchi Cardiovascular: Rhythm regular, heart sounds  normal, no murmurs or gallops, no peripheral edema Abdomen: soft and non-tender, no hepatosplenomegaly, BS normal. Musculoskeletal: No deformities, no cyanosis or clubbing Neuro:  alert, non focal       Assessment & Plan:

## 2011-10-25 NOTE — Patient Instructions (Addendum)
depomedrol shot 120 mg IM Albuterol neb now Prednisone 40 mg daily x until seen again - 1 week Incresae lantus to 30 units daily Take insulin with each meal levaquin 500 mg x 7 days Start protonix daily for reflux Xray today

## 2011-10-26 ENCOUNTER — Telehealth: Payer: Self-pay | Admitting: Pulmonary Disease

## 2011-10-26 MED ORDER — PREDNISONE 10 MG PO TABS: ORAL_TABLET | ORAL | Status: DC

## 2011-10-26 MED ORDER — LEVOFLOXACIN 500 MG PO TABS: 500.0000 mg | ORAL_TABLET | Freq: Every day | ORAL | Status: AC

## 2011-10-26 NOTE — Telephone Encounter (Signed)
Per ov instructions from 12.26.12 pt to have 40mg  prednisone until seen back in the office in 1 week and and levaquin 500mg  x7 days.  Pt has appt with TP 1.4.13 and per RA may take the prednisone until appt.    Called spoke with pt's son, verified pharmacy and advised rx's were sent and will be ready shortly.  Son verbalized his understanding and will keep the 1.4.13 appt with TP.

## 2011-10-26 NOTE — Telephone Encounter (Signed)
This prescription should be filled by [facility].  The pt just saw Dr Vassie Loll yesterday.  I will forward this to his in basket.

## 2011-10-26 NOTE — Telephone Encounter (Signed)
Ok to refill 

## 2011-11-01 ENCOUNTER — Other Ambulatory Visit: Payer: Self-pay | Admitting: Cardiology

## 2011-11-03 ENCOUNTER — Ambulatory Visit (INDEPENDENT_AMBULATORY_CARE_PROVIDER_SITE_OTHER): Payer: Medicare Other | Admitting: Adult Health

## 2011-11-03 ENCOUNTER — Encounter: Payer: Self-pay | Admitting: Adult Health

## 2011-11-03 VITALS — BP 124/74 | HR 82 | Temp 97.3°F | Ht 63.0 in | Wt 213.0 lb

## 2011-11-03 DIAGNOSIS — J441 Chronic obstructive pulmonary disease with (acute) exacerbation: Secondary | ICD-10-CM

## 2011-11-03 MED ORDER — PANTOPRAZOLE SODIUM 40 MG PO TBEC: 40.0000 mg | DELAYED_RELEASE_TABLET | Freq: Every day | ORAL | Status: DC

## 2011-11-03 NOTE — Patient Instructions (Addendum)
Decrease Prednisone 30mg  daily for 5 days , 20 mg daily for 5 days then hold at 10 mg daily until seen back in office.  follow up Dr. Vassie Loll  In 4 weeks and As needed   Continue on CPAP at night.  Begin Protonix 40 mg daily before meal

## 2011-11-03 NOTE — Progress Notes (Signed)
Subjective:    Patient ID: [name] Lane, female    DOB: 12/03/1940, 71 y.o.   MRN: [id number]  HPI  PCP: Reather Littler, FNP   70/F, ex- smoker, quit 2001 for FU of COPD/asthma, on steroids since july'11 to 3/12  Worked in a hosiery mill x 12y. Triggers being activity, perfumes, spring allergies (claritin helps) , cold weather etc. Spirometry >> FEv1 48 % c/w severe airway obstruction.  upper airway pseudo wheeze which improves on calming down  Son (EMT) reports severe anxiety, now on ativan  Added spiriva june '11 to improve exercise tolerance - good results  Has a bird- cockatoo, clean cage.  RAST - mildly pos for cat & dog dander  Admitted 12/11 for COPD exacerbation and atypical chest pain w/ neg enzymes, cath >>showed no new lesions, patent stents. Tx w/ steroids, lopressor was changed to bystolic and Advair was changed to pulmicort and spiriva.  4-5 pulses of steroids since march'12   Download 3/10-4/15/12 >> good control of events , leak ++ on full face mask, used son's nasal mask & liked it >>Changed to fixed pressure 12 cm  5/12 >> started daliresp  10/25/2011 c/o productive cough with green/yellow mucus, wheezing, Shortness of Breath increased SOB with any exertion x 4 wks,  CPAP has not been working properly Down to 5 mg pred, burst of pred pack in sep & 09/19/11 She had a coughing bout in the office with upper airway pseudowheeze & BL coarse rhonchi >> given neb & depomedrol IM Sugars running high per son CXR- no infx >>levaquin and steroid taper  11/03/2011 Follow up  Seen last week for COPD flare , tx w/ Levaquin and steroid taper . CXR last week with no acute process. She is feeling much better. Still feels weak and has some cough and congestion . Did not start protonix due no rx sent per pt. Does want to try as has breakthough gerd despite zantac.  No fever or hemoptysis. Wheezing is better and decreased . Remains on prednisone 40 mg daily.     Review of  Systems  Constitutional:   No  weight loss, night sweats,  Fevers, chills,  +fatigue, or  lassitude.  HEENT:   No headaches,  Difficulty swallowing,  Tooth/dental problems, or  Sore throat,                No sneezing, itching, ear ache, nasal congestion, post nasal drip,   CV:  No chest pain,  Orthopnea, PND, swelling in lower extremities, anasarca, dizziness, palpitations, syncope.   GI  No heartburn, indigestion, abdominal pain, nausea, vomiting, diarrhea, change in bowel habits, loss of appetite, bloody stools.   Resp:   No coughing up of blood.     No chest wall deformity  Skin: no rash or lesions.  GU: no dysuria, change in color of urine, no urgency or frequency.  No flank pain, no hematuria   MS:  No joint pain or swelling.  No decreased range of motion.  No back pain.  Psych:  No change in mood or affect. No depression or anxiety.  No memory loss.         Objective:   Physical Exam   Gen. Pleasant,    normal affect, hard of hearing ENT - no lesions, no post nasal drip Neck: No JVD, no thyromegaly, no carotid bruits Lungs: no use of accessory muscles, no dullness to percussion few  Rhonchi no wheezing  Cardiovascular: Rhythm regular, heart sounds  normal,  no murmurs or gallops, no peripheral edema Abdomen: soft and non-tender, no hepatosplenomegaly, BS normal. Musculoskeletal: No deformities, no cyanosis or clubbing Neuro:  alert, non focal       Assessment & Plan:

## 2011-11-03 NOTE — Assessment & Plan Note (Addendum)
Recent flare now resolving  Will decrease steroids further  Plan:  Decrease Prednisone 30mg  daily for 5 days , 20 mg daily for 5 days then hold at 10 mg daily until seen back in office.  follow up Dr. Vassie Loll  In 4 weeks and As needed   Continue on CPAP at night.  Begin Protonix 40 mg daily before meal

## 2011-11-06 ENCOUNTER — Other Ambulatory Visit: Payer: Self-pay | Admitting: Pulmonary Disease

## 2011-11-06 MED ORDER — ALBUTEROL SULFATE (2.5 MG/3ML) 0.083% IN NEBU: 2.5000 mg | INHALATION_SOLUTION | Freq: Four times a day (QID) | RESPIRATORY_TRACT | Status: DC | PRN

## 2011-11-06 NOTE — Telephone Encounter (Signed)
RX resent to [facility] with diagnosis and directions to file with MCR Part B.

## 2011-11-14 ENCOUNTER — Other Ambulatory Visit: Payer: Self-pay | Admitting: Adult Health

## 2011-11-15 ENCOUNTER — Other Ambulatory Visit: Payer: Self-pay | Admitting: *Deleted

## 2011-11-16 ENCOUNTER — Encounter: Payer: Self-pay | Admitting: *Deleted

## 2011-11-21 ENCOUNTER — Other Ambulatory Visit: Payer: Self-pay | Admitting: Cardiology

## 2011-11-24 ENCOUNTER — Other Ambulatory Visit: Payer: Self-pay | Admitting: *Deleted

## 2011-11-24 MED ORDER — BUDESONIDE 0.5 MG/2ML IN SUSP: 0.5000 mg | Freq: Two times a day (BID) | RESPIRATORY_TRACT | Status: DC

## 2011-11-30 ENCOUNTER — Ambulatory Visit (INDEPENDENT_AMBULATORY_CARE_PROVIDER_SITE_OTHER): Payer: Medicare Other | Admitting: Pulmonary Disease

## 2011-11-30 ENCOUNTER — Encounter: Payer: Self-pay | Admitting: Pulmonary Disease

## 2011-11-30 VITALS — BP 122/78 | HR 86 | Temp 98.3°F | Ht 63.0 in | Wt 211.4 lb

## 2011-11-30 DIAGNOSIS — J441 Chronic obstructive pulmonary disease with (acute) exacerbation: Secondary | ICD-10-CM

## 2011-11-30 NOTE — Progress Notes (Signed)
  Subjective:    Patient ID: [name] Lane, female    DOB: Nov 28, 1940, 71 y.o.   MRN: [id number]  HPI  PCP: Reather Littler, FNP   70/F, ex- smoker, quit 2001 for FU of COPD/asthma, on steroids since july'11 to 3/12  Worked in a hosiery mill x 12y. Triggers being activity, perfumes, spring allergies (claritin helps) , cold weather etc. Spirometry >> FEv1 48 % c/w severe airway obstruction.  upper airway pseudo wheeze which improves on calming down  Son (EMT) reports severe anxiety, now on ativan  Added spiriva june '11 to improve exercise tolerance - good results  5/12 >> started daliresp  Has a bird- cockatoo, clean cage.  RAST - mildly pos for cat & dog dander  Admitted 12/11 for COPD exacerbation and atypical chest pain w/ neg enzymes, cath >>showed no new lesions, patent stents. Tx w/ steroids, lopressor was changed to bystolic and Advair was changed to pulmicort and spiriva.  4-5 pulses of steroids since march'12  Download 3/10-4/15/12 >> good control of events , leak ++ on full face mask, used son's nasal mask & liked it >>Changed to fixed pressure 12 cm   11/30/2011 OV 12/26 Flare requiring prolonged steorids & levaquin 1 month followup - has tapered down slowly to 10 mg. Breathing better but continues to wheeze. Sugars ok Patient states she wears cpap atleast 5 hours a night. States mask fits good. c/o sneezing and a slight cough. Also wheezing and chest tightness when walking. Denies chest pain.      Review of Systems Pt denies any significant  nasal congestion or excess secretions, fever, chills, sweats, unintended wt loss, pleuritic or exertional cp, orthopnea pnd or leg swelling.  Pt also denies any obvious fluctuation in symptoms with weather or environmental change or other alleviating or aggravating factors.    Pt denies any increase in rescue therapy over baseline, denies waking up needing it or having early am exacerbations or coughing/wheezing/ or dyspnea `       Objective:   Physical Exam  Gen. Pleasant, well-nourished, in no distress ENT - no lesions, no post nasal drip Neck: No JVD, no thyromegaly, no carotid bruits Lungs: no use of accessory muscles, no dullness to percussion, no rales, faint BL rhonchi  Cardiovascular: Rhythm regular, heart sounds  normal, no murmurs or gallops, no peripheral edema Musculoskeletal: No deformities, no cyanosis or clubbing        Assessment & Plan:

## 2011-11-30 NOTE — Patient Instructions (Addendum)
Stay on 10 mg prednisone Spiriva once daily Pulmicort nebs twice daily Albuterol nebs every 6h as needed We may have home care check on you

## 2011-11-30 NOTE — Assessment & Plan Note (Signed)
Stay on 10 mg prednisone Spiriva once daily Pulmicort nebs twice daily Albuterol nebs every 6h as needed May need to be referred to COPD home management program if recurs

## 2011-12-18 ENCOUNTER — Other Ambulatory Visit: Payer: Self-pay | Admitting: Pulmonary Disease

## 2011-12-20 MED ORDER — ROFLUMILAST 500 MCG PO TABS: 500.0000 ug | ORAL_TABLET | Freq: Every day | ORAL | Status: DC

## 2011-12-20 NOTE — Telephone Encounter (Signed)
RX sent

## 2012-01-04 HISTORY — PX: CARDIAC CATHETERIZATION: SHX172

## 2012-01-09 ENCOUNTER — Encounter: Payer: Self-pay | Admitting: Pulmonary Disease

## 2012-01-23 ENCOUNTER — Ambulatory Visit (INDEPENDENT_AMBULATORY_CARE_PROVIDER_SITE_OTHER): Payer: Medicare Other | Admitting: Adult Health

## 2012-01-23 ENCOUNTER — Encounter: Payer: Self-pay | Admitting: Adult Health

## 2012-01-23 VITALS — BP 124/78 | HR 100 | Temp 98.2°F | Ht 63.0 in | Wt 218.0 lb

## 2012-01-23 DIAGNOSIS — G4730 Sleep apnea, unspecified: Secondary | ICD-10-CM

## 2012-01-23 DIAGNOSIS — J4489 Other specified chronic obstructive pulmonary disease: Secondary | ICD-10-CM

## 2012-01-23 DIAGNOSIS — J449 Chronic obstructive pulmonary disease, unspecified: Secondary | ICD-10-CM

## 2012-01-23 NOTE — Patient Instructions (Addendum)
Hold Prednisone at 5 mg daily .  Follow up Dr. Vassie Loll  In 6  weeks in [place] [facility]  And As needed   Continue on CPAP at night.  Continue on same medications.

## 2012-01-25 NOTE — Assessment & Plan Note (Signed)
Cont on cpap at night

## 2012-01-25 NOTE — Progress Notes (Signed)
  Subjective:    Patient ID: [name] Lane, female    DOB: 02/28/1941, 71 y.o.   MRN: [id number]  HPI   PCP: Reather Littler, FNP   70/F, ex- smoker, quit 2001 for FU of COPD/asthma, on steroids since july'11 to 3/12  Worked in a hosiery mill x 12y. Triggers being activity, perfumes, spring allergies (claritin helps) , cold weather etc. Spirometry >> FEv1 48 % c/w severe airway obstruction.  upper airway pseudo wheeze which improves on calming down  Son (EMT) reports severe anxiety, now on ativan  Added spiriva june '11 to improve exercise tolerance - good results  5/12 >> started daliresp  Has a bird- cockatoo, clean cage.  RAST - mildly pos for cat & dog dander  Admitted 12/11 for COPD exacerbation and atypical chest pain w/ neg enzymes, cath >>showed no new lesions, patent stents. Tx w/ steroids, lopressor was changed to bystolic and Advair was changed to pulmicort and spiriva.  4-5 pulses of steroids since march'12  Download 3/10-4/15/12 >> good control of events , leak ++ on full face mask, used son's nasal mask & liked it >>Changed to fixed pressure 12 cm   1/31 /2013 OV 12/26 Flare requiring prolonged steorids & levaquin 1 month followup - has tapered down slowly to 10 mg. Breathing better but continues to wheeze. Sugars ok Patient states she wears cpap atleast 5 hours a night. States mask fits good. c/o sneezing and a slight cough. Also wheezing and chest tightness when walking. Denies chest pain.   01/23/12 Follow up  Returns for follow up . Doing well overall. No flare in cough or wheezing .  Wears CPAP most nights without difficulty.  No cp , edema or hemotoysis.    Review of Systems   Constitutional:   No  weight loss, night sweats,  Fevers, chills,  +fatigue, or  lassitude.  HEENT:   No headaches,  Difficulty swallowing,  Tooth/dental problems, or  Sore throat,                No sneezing, itching, ear ache,  nasal congestion, post nasal drip,   CV:  No chest pain,   Orthopnea, PND, swelling in lower extremities, anasarca, dizziness, palpitations, syncope.   GI  No heartburn, indigestion, abdominal pain, nausea, vomiting, diarrhea, change in bowel habits, loss of appetite, bloody stools.   Resp:   No coughing up of blood.  No change in color of mucus.  No wheezing.  No chest wall deformity  Skin: no rash or lesions.  GU: no dysuria, change in color of urine, no urgency or frequency.  No flank pain, no hematuria   MS:  No joint pain or swelling.  No decreased range of motion.  No back pain.  Psych:  No change in mood or affect. No depression or anxiety.  No memory loss.         Objective:   Physical Exam   Gen. Pleasant, well-nourished, in no distress ENT - no lesions, no post nasal drip Neck: No JVD, no thyromegaly, no carotid bruits Lungs: no use of accessory muscles, no dullness to percussion, no rales  Cardiovascular: Rhythm regular, heart sounds  normal, no murmurs or gallops, no peripheral edema Musculoskeletal: No deformities, no cyanosis or clubbing        Assessment & Plan:

## 2012-01-25 NOTE — Assessment & Plan Note (Signed)
Compensated on present regimen.   

## 2012-02-06 ENCOUNTER — Other Ambulatory Visit: Payer: Self-pay | Admitting: Cardiology

## 2012-02-06 NOTE — Telephone Encounter (Signed)
..   Requested Prescriptions   Pending Prescriptions Disp Refills  . amLODipine (NORVASC) 5 MG tablet [Pharmacy Med Name: AMLODIPINE BESYLATE 5MG  TABLETS] 30 tablet 4    Sig: TAKE 1 TABLET BY MOUTH EVERY DAY

## 2012-03-15 ENCOUNTER — Other Ambulatory Visit: Payer: Self-pay | Admitting: Cardiology

## 2012-03-15 NOTE — Telephone Encounter (Signed)
Refilled pravastatin. 

## 2012-03-19 ENCOUNTER — Other Ambulatory Visit: Payer: Self-pay | Admitting: Pulmonary Disease

## 2012-03-19 NOTE — Telephone Encounter (Signed)
Ok to refill 

## 2012-03-19 NOTE — Telephone Encounter (Signed)
Please advise if ok to refill, thanks 

## 2012-03-26 ENCOUNTER — Ambulatory Visit: Payer: Medicare Other | Admitting: Pulmonary Disease

## 2012-05-15 ENCOUNTER — Other Ambulatory Visit: Payer: Self-pay | Admitting: Cardiology

## 2012-05-15 ENCOUNTER — Other Ambulatory Visit: Payer: Self-pay | Admitting: Pulmonary Disease

## 2012-05-15 NOTE — Telephone Encounter (Signed)
Refilled potassium

## 2012-06-02 ENCOUNTER — Other Ambulatory Visit: Payer: Self-pay | Admitting: Adult Health

## 2012-08-06 ENCOUNTER — Other Ambulatory Visit: Payer: Self-pay | Admitting: Cardiology

## 2012-08-16 ENCOUNTER — Other Ambulatory Visit: Payer: Self-pay | Admitting: Adult Health

## 2012-09-18 ENCOUNTER — Ambulatory Visit (INDEPENDENT_AMBULATORY_CARE_PROVIDER_SITE_OTHER): Payer: Medicare Other | Admitting: Pulmonary Disease

## 2012-09-18 ENCOUNTER — Other Ambulatory Visit: Payer: Self-pay | Admitting: Pulmonary Disease

## 2012-09-18 ENCOUNTER — Encounter: Payer: Self-pay | Admitting: Pulmonary Disease

## 2012-09-18 ENCOUNTER — Other Ambulatory Visit (INDEPENDENT_AMBULATORY_CARE_PROVIDER_SITE_OTHER): Payer: Medicare Other

## 2012-09-18 VITALS — BP 132/96 | HR 108 | Ht 63.0 in | Wt 203.0 lb

## 2012-09-18 DIAGNOSIS — J45909 Unspecified asthma, uncomplicated: Secondary | ICD-10-CM

## 2012-09-18 DIAGNOSIS — E119 Type 2 diabetes mellitus without complications: Secondary | ICD-10-CM

## 2012-09-18 LAB — BASIC METABOLIC PANEL
BUN: 25 mg/dL — ABNORMAL HIGH (ref 6–23)
Chloride: 98 mEq/L (ref 96–112)
Creatinine, Ser: 1.5 mg/dL — ABNORMAL HIGH (ref 0.4–1.2)
GFR: 35.89 mL/min — ABNORMAL LOW (ref >60.00)

## 2012-09-18 LAB — CBC WITH DIFFERENTIAL/PLATELET
Basophils Relative: 0.3 % (ref 0.0–3.0)
Eosinophils Absolute: 0.3 10*3/uL (ref 0.0–0.7)
HCT: 38.6 % (ref 36.0–46.0)
Hemoglobin: 12.3 g/dL (ref 12.0–15.0)
MCHC: 31.8 g/dL (ref 30.0–36.0)
MCV: 77 fl — ABNORMAL LOW (ref 78.0–100.0)
Monocytes Absolute: 0.8 10*3/uL (ref 0.1–1.0)
Neutro Abs: 6.8 10*3/uL (ref 1.4–7.7)
RBC: 5.01 Mil/uL (ref 3.87–5.11)

## 2012-09-18 MED ORDER — PREDNISONE 10 MG PO TABS: ORAL_TABLET | ORAL | Status: DC

## 2012-09-18 MED ORDER — METHYLPREDNISOLONE ACETATE 80 MG/ML IJ SUSP
120.0000 mg | Freq: Once | INTRAMUSCULAR | Status: AC
  Administered 2012-09-18: 120 mg via INTRAMUSCULAR

## 2012-09-18 NOTE — Assessment & Plan Note (Addendum)
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

## 2012-09-18 NOTE — Patient Instructions (Signed)
depomedrol 120 mg IM  Albuterol neb  Blood Work - CBC, BMET, RAST today - Based on this we will decide about xolair shots Prednisone 10 mg tabs -Take 4 tabs  daily with food x 4 days, then 3 tabs daily x 4 days, then 2 tabs daily x 4 days, then 1 tab daily x4 days then back to 5 mg daily. #40

## 2012-09-18 NOTE — Assessment & Plan Note (Signed)
Uncontrolled hyperglycemia - worse with steroids Will need Increase lantus or addition of second agent ? Metformin - defer to PCP

## 2012-09-18 NOTE — Progress Notes (Signed)
  Subjective:    Patient ID: [name] Lane, female    DOB: 20-Jan-1941, 71 y.o.   MRN: [id number]  HPI PCP: Reather Littler, FNP   [name], ex- smoker, quit 2001 for FU of COPD/asthma, on steroids since july'11 to 3/12  Worked in a hosiery mill x 12y. Triggers being activity, perfumes, spring allergies (claritin helps) , cold weather etc. Spirometry >> FEv1 48 % c/w severe airway obstruction.  She has upper airway pseudo wheeze which improves on calming down  Son (EMT) reports severe anxiety, now on ativan  Added spiriva june '11 to improve exercise tolerance - good results  5/12 >> started daliresp  Has a bird- cockatoo, clean cage.  RAST - mildly pos for cat & dog dander  Admitted 12/11 for COPD exacerbation and atypical chest pain w/ neg enzymes, cath >>showed no new lesions, patent stents. Tx w/ steroids, lopressor was changed to bystolic and Advair was changed to pulmicort and spiriva.  Several pulses of steroids since march'12 , on daily prednisone since 12/13, lowest 5 mg since 3/13 Download 3/10-4/15/12 >> good control of events , leak ++ on full face mask, used son's nasal mask & liked it >>Changed to fixed pressure 12 cm    09/18/2012 31m FU, accompanied by son - had a good summer on 5 mg pred Breathing has gotten worse over the past few days - no clear reason. Reports chest tightness and pain, cough with production of white mucus, severe wheezing. Hearing has gotten worse. Compliant with CPAP, no excessive daytime somnolence  While being examined, she developed sudden onset increased upper airway wheezing with coughing Seemed to improve somewhat with calming down Son feels she has similar episodes at home  - ? Anxiety induced Labs - glu 305, cr 1.5   Past Medical History  Diagnosis Date  . Unspecified essential hypertension   . Pure hypercholesterolemia   . Other symptoms involving cardiovascular system   . Acute myocardial infarction, unspecified site, episode of care  unspecified   . Chronic airway obstruction, not elsewhere classified   . Anxiety state, unspecified   . Type II or unspecified type diabetes mellitus without mention of complication, not stated as uncontrolled   . Unspecified asthma   . Obstructive sleep apnea   . Edema   . GE reflux   . Coronary artery disease   . Carotid bruit   . Anxiety       Review of Systems neg for any significant sore throat, dysphagia, itching, sneezing, nasal congestion or excess/ purulent secretions, fever, chills, sweats, unintended wt loss, pleuritic or exertional cp, hempoptysis, orthopnea pnd or change in chronic leg swelling. Also denies presyncope, palpitations, heartburn, abdominal pain, nausea, vomiting, diarrhea or change in bowel or urinary habits, dysuria,hematuria, rash, arthralgias, visual complaints, headache, numbness weakness or ataxia.     Objective:   Physical Exam  Gen. Pleasant, obese, in no distress, normal affect ENT - no lesions, no post nasal drip, class 2 airway Neck: No JVD, no thyromegaly, no carotid bruits Lungs: no use of accessory muscles, no dullness to percussion, decreased without rales, BL diffuse rhonchi incl upper airway Cardiovascular: Rhythm regular, heart sounds  normal, no murmurs or gallops, no peripheral edema Abdomen: soft and non-tender, no hepatosplenomegaly, BS normal. Musculoskeletal: No deformities, no cyanosis or clubbing Neuro:  alert, non focal, no tremors       Assessment & Plan:

## 2012-09-19 ENCOUNTER — Other Ambulatory Visit: Payer: Self-pay | Admitting: Cardiology

## 2012-09-19 LAB — ALLERGY FULL PROFILE
Allergen, D pternoyssinus,d7: <0.1 kU/L
Allergen,Goose feathers, e70: 0.12 kU/L — ABNORMAL HIGH
Box Elder IgE: <0.1 kU/L
Common Ragweed: <0.1 kU/L
Curvularia lunata: <0.1 kU/L
Fescue: <0.1 kU/L
G005 Rye, Perennial: <0.1 kU/L
House Dust Hollister: 0.25 kU/L — ABNORMAL HIGH
IgE (Immunoglobulin E), Serum: 636.9 IU/mL — ABNORMAL HIGH (ref 0.0–180.0)
Oak: <0.1 kU/L
Stemphylium Botryosum: <0.1 kU/L
Timothy Grass: <0.1 kU/L

## 2012-09-19 MED ORDER — AMLODIPINE BESYLATE 5 MG PO TABS: 5.0000 mg | ORAL_TABLET | Freq: Every day | ORAL | Status: DC

## 2012-09-23 ENCOUNTER — Other Ambulatory Visit: Payer: Self-pay | Admitting: *Deleted

## 2012-09-23 MED ORDER — PRAVASTATIN SODIUM 40 MG PO TABS: 40.0000 mg | ORAL_TABLET | Freq: Every day | ORAL | Status: DC

## 2012-10-01 ENCOUNTER — Encounter: Payer: Self-pay | Admitting: Adult Health

## 2012-10-01 ENCOUNTER — Ambulatory Visit (INDEPENDENT_AMBULATORY_CARE_PROVIDER_SITE_OTHER): Payer: Medicare Other | Admitting: Adult Health

## 2012-10-01 VITALS — BP 118/66 | HR 69 | Temp 98.0°F | Ht 63.0 in | Wt 199.8 lb

## 2012-10-01 DIAGNOSIS — J45901 Unspecified asthma with (acute) exacerbation: Secondary | ICD-10-CM

## 2012-10-01 DIAGNOSIS — G4730 Sleep apnea, unspecified: Secondary | ICD-10-CM

## 2012-10-01 NOTE — Patient Instructions (Addendum)
I will be in touch regarding [name]  Continue on current regimen.  follow up Dr. Vassie Loll  In 2 months and As needed

## 2012-10-01 NOTE — Progress Notes (Signed)
Subjective:    Patient ID: [name] Lane, female    DOB: 06/06/1941, 71 y.o.   MRN: [id number]  HPI  PCP: Reather Littler, FNP   [name], ex- smoker, quit 2001 for FU of COPD/asthma, on steroids since july'11 to 3/12  Worked in a hosiery mill x 12y. Triggers being activity, perfumes, spring allergies (claritin helps) , cold weather etc. Spirometry >> FEv1 48 % c/w severe airway obstruction.  She has upper airway pseudo wheeze which improves on calming down  Son (EMT) reports severe anxiety, now on ativan  Added spiriva june '11 to improve exercise tolerance - good results  5/12 >> started daliresp  Has a bird- cockatoo, clean cage.  RAST - mildly pos for cat & dog dander  Admitted 12/11 for COPD exacerbation and atypical chest pain w/ neg enzymes, cath >>showed no new lesions, patent stents. Tx w/ steroids, lopressor was changed to bystolic and Advair was changed to pulmicort and spiriva.  Several pulses of steroids since march'12 , on daily prednisone since 12/13, lowest 5 mg since 3/13 Download 3/10-4/15/12 >> good control of events , leak ++ on full face mask, used son's nasal mask & liked it >>Changed to fixed pressure 12 cm    09/18/12  71m FU, accompanied by son - had a good summer on 5 mg pred Breathing has gotten worse over the past few days - no clear reason. Reports chest tightness and pain, cough with production of white mucus, severe wheezing. Hearing has gotten worse. Compliant with CPAP, no excessive daytime somnolence  While being examined, she developed sudden onset increased upper airway wheezing with coughing Seemed to improve somewhat with calming down Son feels she has similar episodes at home  - ? Anxiety induced Labs - glu 305, cr 1.5 >pred taper , depo shot   10/01/2012 Follow up  Returns for follow up .  Had recent flare tx w/ steroid taper and depo medrol shot.  Feels much better with decreased cough and wheezing.  Labs showed IGE 636, Rast +house dust, dog, cat  dander  We discussed trial of [name] if she qualifies.  No chest pain , edema, fever or discolored mucus.  Does have pets inside -dog and bird .  Past Medical History  Diagnosis Date  . Unspecified essential hypertension   . Pure hypercholesterolemia   . Other symptoms involving cardiovascular system   . Acute myocardial infarction, unspecified site, episode of care unspecified   . Chronic airway obstruction, not elsewhere classified   . Anxiety state, unspecified   . Type II or unspecified type diabetes mellitus without mention of complication, not stated as uncontrolled   . Unspecified asthma   . Obstructive sleep apnea   . Edema   . GE reflux   . Coronary artery disease   . Carotid bruit   . Anxiety       Review of Systems  neg for any significant sore throat, dysphagia, itching, sneezing, nasal congestion or excess/ purulent secretions, fever, chills, sweats, unintended wt loss, pleuritic or exertional cp, hempoptysis, orthopnea pnd or change in chronic leg swelling. Also denies presyncope, palpitations, heartburn, abdominal pain, nausea, vomiting, diarrhea or change in bowel or urinary habits, dysuria,hematuria, rash, arthralgias, visual complaints, headache, numbness weakness or ataxia.     Objective:   Physical Exam   Gen. Pleasant, obese, in no distress, normal affect ENT - no lesions, no post nasal drip, class 2 airway Neck: No JVD, no thyromegaly, no carotid bruits Lungs: no use  of accessory muscles, no dullness to percussion, decreased BA in bases without rales, Cardiovascular: Rhythm regular, heart sounds  normal, no murmurs or gallops, no peripheral edema Abdomen: soft and non-tender, no hepatosplenomegaly, BS normal. Musculoskeletal: No deformities, no cyanosis or clubbing Neuro:  alert, non focal, no tremors       Assessment & Plan:

## 2012-10-03 NOTE — Assessment & Plan Note (Signed)
Recent exacerbation , now resolved  Labs showed elevated IgE at ~600 , w/ +rast  Will begin [name] process to see if she qualifies.

## 2012-10-03 NOTE — Assessment & Plan Note (Signed)
Cont on CPAP  

## 2012-10-04 NOTE — Addendum Note (Signed)
Addended by: Boone Master E on: 10/04/2012 05:39 PM   Modules accepted: Orders

## 2012-10-15 ENCOUNTER — Other Ambulatory Visit: Payer: Self-pay | Admitting: Pulmonary Disease

## 2012-11-06 ENCOUNTER — Telehealth: Payer: Self-pay | Admitting: Pulmonary Disease

## 2012-11-06 NOTE — Telephone Encounter (Signed)
This was routed to me; not sure why. Marland Kitchen .forwarding back to [place].

## 2012-11-15 ENCOUNTER — Telehealth: Payer: Self-pay | Admitting: Pulmonary Disease

## 2012-11-15 NOTE — Telephone Encounter (Signed)
Epi pen son stated no problem let them know once the meds get here

## 2012-11-18 ENCOUNTER — Other Ambulatory Visit: Payer: Self-pay | Admitting: *Deleted

## 2012-11-18 MED ORDER — AMLODIPINE BESYLATE 5 MG PO TABS: 5.0000 mg | ORAL_TABLET | Freq: Every day | ORAL | Status: DC

## 2012-11-18 MED ORDER — POTASSIUM CHLORIDE ER 10 MEQ PO TBCR: 10.0000 meq | EXTENDED_RELEASE_TABLET | Freq: Every day | ORAL | Status: DC

## 2012-11-20 ENCOUNTER — Other Ambulatory Visit: Payer: Self-pay | Admitting: *Deleted

## 2012-12-02 ENCOUNTER — Telehealth: Payer: Self-pay | Admitting: Pulmonary Disease

## 2012-12-02 NOTE — Telephone Encounter (Signed)
Attempted to call patient 3x to schedule a follow up appointment. No return call back. Sent letter 12/02/12 °

## 2012-12-19 ENCOUNTER — Other Ambulatory Visit: Payer: Self-pay

## 2012-12-19 MED ORDER — POTASSIUM CHLORIDE ER 10 MEQ PO TBCR: 10.0000 meq | EXTENDED_RELEASE_TABLET | Freq: Every day | ORAL | Status: DC

## 2012-12-20 ENCOUNTER — Other Ambulatory Visit: Payer: Self-pay | Admitting: *Deleted

## 2013-01-14 ENCOUNTER — Telehealth: Payer: Self-pay | Admitting: Pulmonary Disease

## 2013-01-14 NOTE — Telephone Encounter (Signed)
Will try calling try again later

## 2013-01-16 NOTE — Telephone Encounter (Signed)
lmtcb x1 for pt. 

## 2013-01-17 NOTE — Telephone Encounter (Signed)
lmtcb x2 for pt. 

## 2013-01-21 NOTE — Telephone Encounter (Signed)
lmtcb x3 for pt. 

## 2013-01-22 ENCOUNTER — Encounter: Payer: Self-pay | Admitting: *Deleted

## 2013-01-22 NOTE — Telephone Encounter (Signed)
Letter also mailed to the [address] Box address in pt's chart

## 2013-01-22 NOTE — Telephone Encounter (Signed)
lmomtcb for pt's son per chart request

## 2013-01-24 NOTE — Telephone Encounter (Signed)
LMTCB again on son number. Letter has been mailed so I will close message and await call back. Carron Curie, CMA

## 2013-01-28 ENCOUNTER — Ambulatory Visit (INDEPENDENT_AMBULATORY_CARE_PROVIDER_SITE_OTHER): Payer: Medicare Other

## 2013-01-28 DIAGNOSIS — J45909 Unspecified asthma, uncomplicated: Secondary | ICD-10-CM

## 2013-01-29 ENCOUNTER — Other Ambulatory Visit: Payer: Self-pay | Admitting: *Deleted

## 2013-01-29 DIAGNOSIS — J45909 Unspecified asthma, uncomplicated: Secondary | ICD-10-CM

## 2013-01-29 MED ORDER — OMALIZUMAB 150 MG ~~LOC~~ SOLR
375.0000 mg | Freq: Once | SUBCUTANEOUS | Status: AC
Start: 2013-01-29 — End: 2013-01-29
  Administered 2013-01-29: 375 mg via SUBCUTANEOUS

## 2013-01-29 MED ORDER — POTASSIUM CHLORIDE ER 10 MEQ PO TBCR: 10.0000 meq | EXTENDED_RELEASE_TABLET | Freq: Every day | ORAL | Status: DC

## 2013-02-11 ENCOUNTER — Ambulatory Visit (INDEPENDENT_AMBULATORY_CARE_PROVIDER_SITE_OTHER): Payer: Medicare Other

## 2013-02-11 DIAGNOSIS — J45909 Unspecified asthma, uncomplicated: Secondary | ICD-10-CM

## 2013-02-11 MED ORDER — OMALIZUMAB 150 MG ~~LOC~~ SOLR
375.0000 mg | Freq: Once | SUBCUTANEOUS | Status: AC
  Administered 2013-02-11: 375 mg via SUBCUTANEOUS

## 2013-02-20 ENCOUNTER — Other Ambulatory Visit: Payer: Self-pay | Admitting: Adult Health

## 2013-02-20 NOTE — Telephone Encounter (Signed)
Last ov w/ TP 12.3.13, follow up w/ RA in 2 months.  Appt never scheduled.  May have 2 refills protonix, but needs appt w/ Dr Vassie Loll.

## 2013-02-25 ENCOUNTER — Ambulatory Visit (INDEPENDENT_AMBULATORY_CARE_PROVIDER_SITE_OTHER): Payer: Medicare Other

## 2013-02-25 DIAGNOSIS — J45909 Unspecified asthma, uncomplicated: Secondary | ICD-10-CM

## 2013-02-25 MED ORDER — OMALIZUMAB 150 MG ~~LOC~~ SOLR
375.0000 mg | Freq: Once | SUBCUTANEOUS | Status: AC
  Administered 2013-02-25: 375 mg via SUBCUTANEOUS

## 2013-03-11 ENCOUNTER — Ambulatory Visit (INDEPENDENT_AMBULATORY_CARE_PROVIDER_SITE_OTHER): Payer: Medicare Other

## 2013-03-11 DIAGNOSIS — J45909 Unspecified asthma, uncomplicated: Secondary | ICD-10-CM

## 2013-03-11 MED ORDER — OMALIZUMAB 150 MG ~~LOC~~ SOLR
375.0000 mg | Freq: Once | SUBCUTANEOUS | Status: AC
  Administered 2013-03-11: 375 mg via SUBCUTANEOUS

## 2013-03-25 ENCOUNTER — Ambulatory Visit (INDEPENDENT_AMBULATORY_CARE_PROVIDER_SITE_OTHER): Payer: Medicare Other

## 2013-03-25 DIAGNOSIS — J45909 Unspecified asthma, uncomplicated: Secondary | ICD-10-CM

## 2013-03-26 MED ORDER — OMALIZUMAB 150 MG ~~LOC~~ SOLR
375.0000 mg | Freq: Once | SUBCUTANEOUS | Status: AC
  Administered 2013-03-26: 375 mg via SUBCUTANEOUS

## 2013-04-04 ENCOUNTER — Other Ambulatory Visit: Payer: Self-pay | Admitting: Pulmonary Disease

## 2013-04-08 ENCOUNTER — Ambulatory Visit (INDEPENDENT_AMBULATORY_CARE_PROVIDER_SITE_OTHER): Payer: Medicare Other

## 2013-04-08 DIAGNOSIS — J45909 Unspecified asthma, uncomplicated: Secondary | ICD-10-CM

## 2013-04-09 MED ORDER — OMALIZUMAB 150 MG ~~LOC~~ SOLR
375.0000 mg | Freq: Once | SUBCUTANEOUS | Status: AC
  Administered 2013-04-09: 375 mg via SUBCUTANEOUS

## 2013-04-22 ENCOUNTER — Ambulatory Visit (INDEPENDENT_AMBULATORY_CARE_PROVIDER_SITE_OTHER): Payer: Medicare Other

## 2013-04-22 DIAGNOSIS — J45909 Unspecified asthma, uncomplicated: Secondary | ICD-10-CM

## 2013-04-24 DIAGNOSIS — J45909 Unspecified asthma, uncomplicated: Secondary | ICD-10-CM

## 2013-04-24 MED ORDER — OMALIZUMAB 150 MG ~~LOC~~ SOLR
375.0000 mg | Freq: Once | SUBCUTANEOUS | Status: AC
  Administered 2013-04-24: 375 mg via SUBCUTANEOUS

## 2013-05-06 ENCOUNTER — Ambulatory Visit: Payer: Medicare Other

## 2013-05-09 ENCOUNTER — Ambulatory Visit (INDEPENDENT_AMBULATORY_CARE_PROVIDER_SITE_OTHER): Payer: Medicare Other

## 2013-05-09 DIAGNOSIS — J45909 Unspecified asthma, uncomplicated: Secondary | ICD-10-CM

## 2013-05-09 MED ORDER — OMALIZUMAB 150 MG ~~LOC~~ SOLR
375.0000 mg | Freq: Once | SUBCUTANEOUS | Status: AC
  Administered 2013-05-09: 375 mg via SUBCUTANEOUS

## 2013-05-11 ENCOUNTER — Other Ambulatory Visit: Payer: Self-pay | Admitting: Pulmonary Disease

## 2013-05-14 ENCOUNTER — Other Ambulatory Visit: Payer: Self-pay | Admitting: Pulmonary Disease

## 2013-05-23 ENCOUNTER — Ambulatory Visit (INDEPENDENT_AMBULATORY_CARE_PROVIDER_SITE_OTHER): Payer: Medicare Other

## 2013-05-23 DIAGNOSIS — J45909 Unspecified asthma, uncomplicated: Secondary | ICD-10-CM

## 2013-05-26 MED ORDER — OMALIZUMAB 150 MG ~~LOC~~ SOLR
375.0000 mg | Freq: Once | SUBCUTANEOUS | Status: AC
  Administered 2013-05-26: 375 mg via SUBCUTANEOUS

## 2013-06-06 ENCOUNTER — Ambulatory Visit (INDEPENDENT_AMBULATORY_CARE_PROVIDER_SITE_OTHER): Payer: Medicare Other

## 2013-06-06 DIAGNOSIS — J45909 Unspecified asthma, uncomplicated: Secondary | ICD-10-CM

## 2013-06-09 MED ORDER — OMALIZUMAB 150 MG ~~LOC~~ SOLR
375.0000 mg | Freq: Once | SUBCUTANEOUS | Status: AC
  Administered 2013-06-09: 375 mg via SUBCUTANEOUS

## 2013-06-20 ENCOUNTER — Ambulatory Visit (INDEPENDENT_AMBULATORY_CARE_PROVIDER_SITE_OTHER): Payer: Medicare Other

## 2013-06-20 DIAGNOSIS — J45909 Unspecified asthma, uncomplicated: Secondary | ICD-10-CM

## 2013-06-24 MED ORDER — OMALIZUMAB 150 MG ~~LOC~~ SOLR
375.0000 mg | Freq: Once | SUBCUTANEOUS | Status: AC
  Administered 2013-06-24: 375 mg via SUBCUTANEOUS

## 2013-07-04 ENCOUNTER — Ambulatory Visit (INDEPENDENT_AMBULATORY_CARE_PROVIDER_SITE_OTHER): Payer: Medicare Other

## 2013-07-04 DIAGNOSIS — J45909 Unspecified asthma, uncomplicated: Secondary | ICD-10-CM

## 2013-07-07 MED ORDER — OMALIZUMAB 150 MG ~~LOC~~ SOLR
375.0000 mg | Freq: Once | SUBCUTANEOUS | Status: AC
  Administered 2013-07-07: 375 mg via SUBCUTANEOUS

## 2013-07-18 ENCOUNTER — Ambulatory Visit (INDEPENDENT_AMBULATORY_CARE_PROVIDER_SITE_OTHER): Payer: Medicare Other

## 2013-07-18 DIAGNOSIS — J45909 Unspecified asthma, uncomplicated: Secondary | ICD-10-CM

## 2013-07-18 MED ORDER — OMALIZUMAB 150 MG ~~LOC~~ SOLR
375.0000 mg | Freq: Once | SUBCUTANEOUS | Status: AC
  Administered 2013-07-18: 375 mg via SUBCUTANEOUS

## 2013-07-31 ENCOUNTER — Other Ambulatory Visit: Payer: Self-pay | Admitting: Pulmonary Disease

## 2013-08-01 ENCOUNTER — Ambulatory Visit (INDEPENDENT_AMBULATORY_CARE_PROVIDER_SITE_OTHER): Payer: Medicare Other

## 2013-08-01 DIAGNOSIS — J45909 Unspecified asthma, uncomplicated: Secondary | ICD-10-CM

## 2013-08-04 MED ORDER — OMALIZUMAB 150 MG ~~LOC~~ SOLR
375.0000 mg | Freq: Once | SUBCUTANEOUS | Status: AC
  Administered 2013-08-04: 375 mg via SUBCUTANEOUS

## 2013-08-15 ENCOUNTER — Ambulatory Visit (INDEPENDENT_AMBULATORY_CARE_PROVIDER_SITE_OTHER): Payer: Medicare Other

## 2013-08-15 DIAGNOSIS — J45909 Unspecified asthma, uncomplicated: Secondary | ICD-10-CM

## 2013-08-15 MED ORDER — OMALIZUMAB 150 MG ~~LOC~~ SOLR
375.0000 mg | Freq: Once | SUBCUTANEOUS | Status: AC
  Administered 2013-08-15: 375 mg via SUBCUTANEOUS

## 2013-08-29 ENCOUNTER — Ambulatory Visit (INDEPENDENT_AMBULATORY_CARE_PROVIDER_SITE_OTHER): Payer: Medicare Other

## 2013-08-29 DIAGNOSIS — J45909 Unspecified asthma, uncomplicated: Secondary | ICD-10-CM

## 2013-09-03 MED ORDER — OMALIZUMAB 150 MG ~~LOC~~ SOLR
375.0000 mg | Freq: Once | SUBCUTANEOUS | Status: AC
  Administered 2013-09-03: 375 mg via SUBCUTANEOUS

## 2013-09-12 ENCOUNTER — Ambulatory Visit (INDEPENDENT_AMBULATORY_CARE_PROVIDER_SITE_OTHER): Payer: Medicare Other

## 2013-09-12 DIAGNOSIS — J45909 Unspecified asthma, uncomplicated: Secondary | ICD-10-CM

## 2013-09-12 MED ORDER — OMALIZUMAB 150 MG ~~LOC~~ SOLR
375.0000 mg | Freq: Once | SUBCUTANEOUS | Status: AC
  Administered 2013-09-12: 375 mg via SUBCUTANEOUS

## 2013-09-26 ENCOUNTER — Ambulatory Visit (INDEPENDENT_AMBULATORY_CARE_PROVIDER_SITE_OTHER): Payer: Medicare Other

## 2013-09-26 DIAGNOSIS — J45909 Unspecified asthma, uncomplicated: Secondary | ICD-10-CM

## 2013-09-26 MED ORDER — OMALIZUMAB 150 MG ~~LOC~~ SOLR
375.0000 mg | Freq: Once | SUBCUTANEOUS | Status: AC
  Administered 2013-09-26: 375 mg via SUBCUTANEOUS

## 2013-10-03 ENCOUNTER — Encounter: Payer: Self-pay | Admitting: Pulmonary Disease

## 2013-10-10 ENCOUNTER — Ambulatory Visit (INDEPENDENT_AMBULATORY_CARE_PROVIDER_SITE_OTHER): Payer: Medicare Other

## 2013-10-10 DIAGNOSIS — J45909 Unspecified asthma, uncomplicated: Secondary | ICD-10-CM

## 2013-10-13 MED ORDER — OMALIZUMAB 150 MG ~~LOC~~ SOLR
375.0000 mg | Freq: Once | SUBCUTANEOUS | Status: AC
  Administered 2013-10-13: 375 mg via SUBCUTANEOUS

## 2013-10-24 ENCOUNTER — Ambulatory Visit (INDEPENDENT_AMBULATORY_CARE_PROVIDER_SITE_OTHER): Payer: Medicare Other

## 2013-10-24 DIAGNOSIS — J45909 Unspecified asthma, uncomplicated: Secondary | ICD-10-CM

## 2013-10-27 MED ORDER — OMALIZUMAB 150 MG ~~LOC~~ SOLR
300.0000 mg | Freq: Once | SUBCUTANEOUS | Status: AC
  Administered 2013-10-27: 300 mg via SUBCUTANEOUS

## 2013-10-31 ENCOUNTER — Encounter: Payer: Self-pay | Admitting: Internal Medicine

## 2013-11-07 ENCOUNTER — Ambulatory Visit (INDEPENDENT_AMBULATORY_CARE_PROVIDER_SITE_OTHER): Payer: Medicare Other

## 2013-11-07 DIAGNOSIS — J45909 Unspecified asthma, uncomplicated: Secondary | ICD-10-CM

## 2013-11-21 ENCOUNTER — Ambulatory Visit (INDEPENDENT_AMBULATORY_CARE_PROVIDER_SITE_OTHER): Payer: Medicare Other

## 2013-11-21 DIAGNOSIS — J45909 Unspecified asthma, uncomplicated: Secondary | ICD-10-CM

## 2013-11-24 MED ORDER — OMALIZUMAB 150 MG ~~LOC~~ SOLR
375.0000 mg | Freq: Once | SUBCUTANEOUS | Status: AC
  Administered 2013-11-24: 375 mg via SUBCUTANEOUS

## 2013-12-05 ENCOUNTER — Ambulatory Visit (INDEPENDENT_AMBULATORY_CARE_PROVIDER_SITE_OTHER): Payer: Medicare Other

## 2013-12-05 DIAGNOSIS — J45909 Unspecified asthma, uncomplicated: Secondary | ICD-10-CM

## 2013-12-10 MED ORDER — OMALIZUMAB 150 MG ~~LOC~~ SOLR
375.0000 mg | Freq: Once | SUBCUTANEOUS | Status: AC
  Administered 2013-12-10: 375 mg via SUBCUTANEOUS

## 2013-12-19 ENCOUNTER — Ambulatory Visit (INDEPENDENT_AMBULATORY_CARE_PROVIDER_SITE_OTHER): Payer: Medicare Other

## 2013-12-19 DIAGNOSIS — J45909 Unspecified asthma, uncomplicated: Secondary | ICD-10-CM

## 2013-12-19 MED ORDER — OMALIZUMAB 150 MG ~~LOC~~ SOLR
375.0000 mg | Freq: Once | SUBCUTANEOUS | Status: AC
  Administered 2013-12-19: 375 mg via SUBCUTANEOUS

## 2013-12-30 ENCOUNTER — Inpatient Hospital Stay (HOSPITAL_COMMUNITY)
Admission: EM | Admit: 2013-12-30 | Discharge: 2014-01-07 | DRG: 247 | Disposition: A | Discharge status: Home / Self Care | Payer: Medicare Other | Attending: Internal Medicine | Admitting: Internal Medicine

## 2013-12-30 ENCOUNTER — Emergency Department (HOSPITAL_COMMUNITY): Payer: Medicare Other

## 2013-12-30 ENCOUNTER — Encounter (HOSPITAL_COMMUNITY): Payer: Self-pay | Admitting: Emergency Medicine

## 2013-12-30 DIAGNOSIS — I252 Old myocardial infarction: Secondary | ICD-10-CM

## 2013-12-30 DIAGNOSIS — Z794 Long term (current) use of insulin: Secondary | ICD-10-CM

## 2013-12-30 DIAGNOSIS — L0390 Cellulitis, unspecified: Secondary | ICD-10-CM

## 2013-12-30 DIAGNOSIS — Z7982 Long term (current) use of aspirin: Secondary | ICD-10-CM

## 2013-12-30 DIAGNOSIS — I214 Non-ST elevation (NSTEMI) myocardial infarction: Secondary | ICD-10-CM | POA: Diagnosis present

## 2013-12-30 DIAGNOSIS — J45901 Unspecified asthma with (acute) exacerbation: Secondary | ICD-10-CM

## 2013-12-30 DIAGNOSIS — I10 Essential (primary) hypertension: Secondary | ICD-10-CM

## 2013-12-30 DIAGNOSIS — I38 Endocarditis, valve unspecified: Secondary | ICD-10-CM | POA: Diagnosis present

## 2013-12-30 DIAGNOSIS — E119 Type 2 diabetes mellitus without complications: Secondary | ICD-10-CM | POA: Diagnosis present

## 2013-12-30 DIAGNOSIS — G4730 Sleep apnea, unspecified: Secondary | ICD-10-CM

## 2013-12-30 DIAGNOSIS — R062 Wheezing: Secondary | ICD-10-CM

## 2013-12-30 DIAGNOSIS — K219 Gastro-esophageal reflux disease without esophagitis: Secondary | ICD-10-CM | POA: Diagnosis present

## 2013-12-30 DIAGNOSIS — N179 Acute kidney failure, unspecified: Secondary | ICD-10-CM | POA: Diagnosis not present

## 2013-12-30 DIAGNOSIS — IMO0002 Reserved for concepts with insufficient information to code with codable children: Secondary | ICD-10-CM | POA: Diagnosis not present

## 2013-12-30 DIAGNOSIS — N183 Chronic kidney disease, stage 3 unspecified: Secondary | ICD-10-CM | POA: Diagnosis not present

## 2013-12-30 DIAGNOSIS — E7800 Pure hypercholesterolemia, unspecified: Secondary | ICD-10-CM | POA: Diagnosis present

## 2013-12-30 DIAGNOSIS — Z87891 Personal history of nicotine dependence: Secondary | ICD-10-CM

## 2013-12-30 DIAGNOSIS — Z9119 Patient's noncompliance with other medical treatment and regimen: Secondary | ICD-10-CM

## 2013-12-30 DIAGNOSIS — I2510 Atherosclerotic heart disease of native coronary artery without angina pectoris: Secondary | ICD-10-CM | POA: Diagnosis present

## 2013-12-30 DIAGNOSIS — J449 Chronic obstructive pulmonary disease, unspecified: Secondary | ICD-10-CM | POA: Diagnosis present

## 2013-12-30 DIAGNOSIS — N184 Chronic kidney disease, stage 4 (severe): Secondary | ICD-10-CM | POA: Diagnosis present

## 2013-12-30 DIAGNOSIS — G4733 Obstructive sleep apnea (adult) (pediatric): Secondary | ICD-10-CM | POA: Diagnosis present

## 2013-12-30 DIAGNOSIS — Z9989 Dependence on other enabling machines and devices: Secondary | ICD-10-CM

## 2013-12-30 DIAGNOSIS — I129 Hypertensive chronic kidney disease with stage 1 through stage 4 chronic kidney disease, or unspecified chronic kidney disease: Secondary | ICD-10-CM | POA: Diagnosis present

## 2013-12-30 DIAGNOSIS — Z91199 Patient's noncompliance with other medical treatment and regimen due to unspecified reason: Secondary | ICD-10-CM

## 2013-12-30 DIAGNOSIS — E785 Hyperlipidemia, unspecified: Secondary | ICD-10-CM

## 2013-12-30 DIAGNOSIS — J441 Chronic obstructive pulmonary disease with (acute) exacerbation: Secondary | ICD-10-CM

## 2013-12-30 DIAGNOSIS — R079 Chest pain, unspecified: Secondary | ICD-10-CM | POA: Diagnosis present

## 2013-12-30 DIAGNOSIS — Z9861 Coronary angioplasty status: Secondary | ICD-10-CM

## 2013-12-30 DIAGNOSIS — R0600 Dyspnea, unspecified: Secondary | ICD-10-CM | POA: Diagnosis present

## 2013-12-30 DIAGNOSIS — R9439 Abnormal result of other cardiovascular function study: Secondary | ICD-10-CM | POA: Diagnosis not present

## 2013-12-30 DIAGNOSIS — F411 Generalized anxiety disorder: Secondary | ICD-10-CM | POA: Diagnosis present

## 2013-12-30 DIAGNOSIS — T502X5A Adverse effect of carbonic-anhydrase inhibitors, benzothiadiazides and other diuretics, initial encounter: Secondary | ICD-10-CM | POA: Diagnosis not present

## 2013-12-30 DIAGNOSIS — Z955 Presence of coronary angioplasty implant and graft: Secondary | ICD-10-CM

## 2013-12-30 LAB — PRO B NATRIURETIC PEPTIDE: PRO B NATRI PEPTIDE: 320.8 pg/mL — AB (ref 0–125)

## 2013-12-30 LAB — CBC WITH DIFFERENTIAL/PLATELET
BASOS PCT: 0 % (ref 0–1)
Basophils Absolute: 0 10*3/uL (ref 0.0–0.1)
Eosinophils Absolute: 0.1 10*3/uL (ref 0.0–0.7)
Eosinophils Relative: 1 % (ref 0–5)
HEMATOCRIT: 37 % (ref 36.0–46.0)
Hemoglobin: 12.4 g/dL (ref 12.0–15.0)
Lymphocytes Relative: 18 % (ref 12–46)
Lymphs Abs: 1.7 10*3/uL (ref 0.7–4.0)
MCH: 26.8 pg (ref 26.0–34.0)
MCHC: 33.5 g/dL (ref 30.0–36.0)
MCV: 79.9 fL (ref 78.0–100.0)
MONO ABS: 0.6 10*3/uL (ref 0.1–1.0)
Monocytes Relative: 7 % (ref 3–12)
NEUTROS ABS: 6.7 10*3/uL (ref 1.7–7.7)
Neutrophils Relative %: 74 % (ref 43–77)
Platelets: 249 10*3/uL (ref 150–400)
RBC: 4.63 MIL/uL (ref 3.87–5.11)
RDW: 14.8 % (ref 11.5–15.5)
WBC: 9.1 10*3/uL (ref 4.0–10.5)

## 2013-12-30 LAB — I-STAT CHEM 8, ED
BUN: 17 mg/dL (ref 6–23)
CALCIUM ION: 1.24 mmol/L (ref 1.13–1.30)
CHLORIDE: 101 meq/L (ref 96–112)
CREATININE: 1.3 mg/dL — AB (ref 0.50–1.10)
GLUCOSE: 267 mg/dL — AB (ref 70–99)
HEMATOCRIT: 40 % (ref 36.0–46.0)
Hemoglobin: 13.6 g/dL (ref 12.0–15.0)
Potassium: 3.5 mEq/L — ABNORMAL LOW (ref 3.7–5.3)
Sodium: 143 mEq/L (ref 137–147)
TCO2: 24 mmol/L (ref 0–100)

## 2013-12-30 LAB — BASIC METABOLIC PANEL
BUN: 17 mg/dL (ref 6–23)
CALCIUM: 9.7 mg/dL (ref 8.4–10.5)
CO2: 25 meq/L (ref 19–32)
Chloride: 102 mEq/L (ref 96–112)
Creatinine, Ser: 1.2 mg/dL — ABNORMAL HIGH (ref 0.50–1.10)
GFR calc Af Amer: 50 mL/min — ABNORMAL LOW (ref >90)
GFR calc non Af Amer: 43 mL/min — ABNORMAL LOW (ref >90)
Glucose, Bld: 267 mg/dL — ABNORMAL HIGH (ref 70–99)
Potassium: 3.7 mEq/L (ref 3.7–5.3)
SODIUM: 141 meq/L (ref 137–147)

## 2013-12-30 LAB — CBG MONITORING, ED: Glucose-Capillary: 231 mg/dL — ABNORMAL HIGH (ref 70–99)

## 2013-12-30 LAB — I-STAT TROPONIN, ED
TROPONIN I, POC: 0 ng/mL (ref 0.00–0.08)
Troponin i, poc: 0.01 ng/mL (ref 0.00–0.08)

## 2013-12-30 LAB — TROPONIN I: Troponin I: <0.3 ng/mL (ref <0.30)

## 2013-12-30 MED ORDER — IPRATROPIUM-ALBUTEROL 0.5-2.5 (3) MG/3ML IN SOLN: 3.0000 mL | Freq: Once | RESPIRATORY_TRACT | Status: DC

## 2013-12-30 MED ORDER — ENOXAPARIN SODIUM 40 MG/0.4ML ~~LOC~~ SOLN
40.0000 mg | SUBCUTANEOUS | Status: DC
  Filled 2013-12-30 (×5): qty 0.4

## 2013-12-30 MED ORDER — ALBUTEROL SULFATE (2.5 MG/3ML) 0.083% IN NEBU: 2.5000 mg | INHALATION_SOLUTION | RESPIRATORY_TRACT | Status: DC | PRN

## 2013-12-30 MED ORDER — IPRATROPIUM BROMIDE 0.02 % IN SOLN
0.5000 mg | Freq: Once | RESPIRATORY_TRACT | Status: AC
  Administered 2013-12-30: 0.5 mg via RESPIRATORY_TRACT
  Filled 2013-12-30: qty 2.5

## 2013-12-30 MED ORDER — ASPIRIN EC 325 MG PO TBEC
325.0000 mg | DELAYED_RELEASE_TABLET | Freq: Every day | ORAL | Status: DC
  Administered 2013-12-31 – 2014-01-01 (×2): 325 mg via ORAL
  Filled 2013-12-30 (×2): qty 1

## 2013-12-30 MED ORDER — NEBIVOLOL HCL 5 MG PO TABS
5.0000 mg | ORAL_TABLET | Freq: Every day | ORAL | Status: DC
  Administered 2013-12-31 – 2014-01-01 (×2): 5 mg via ORAL
  Filled 2013-12-30 (×3): qty 1

## 2013-12-30 MED ORDER — FUROSEMIDE 40 MG PO TABS
40.0000 mg | ORAL_TABLET | Freq: Two times a day (BID) | ORAL | Status: DC
  Administered 2013-12-31 – 2014-01-01 (×4): 40 mg via ORAL
  Filled 2013-12-30 (×8): qty 1.5

## 2013-12-30 MED ORDER — SIMVASTATIN 20 MG PO TABS
20.0000 mg | ORAL_TABLET | Freq: Every day | ORAL | Status: DC
Start: 2013-12-31 — End: 2014-01-07
  Administered 2013-12-31 – 2014-01-06 (×7): 20 mg via ORAL
  Filled 2013-12-30 (×12): qty 1

## 2013-12-30 MED ORDER — ASPIRIN 81 MG PO CHEW
324.0000 mg | CHEWABLE_TABLET | Freq: Once | ORAL | Status: DC
Start: 2013-12-30 — End: 2014-01-01

## 2013-12-30 MED ORDER — BUDESONIDE 0.5 MG/2ML IN SUSP
0.5000 mg | Freq: Two times a day (BID) | RESPIRATORY_TRACT | Status: DC
  Administered 2013-12-31 – 2014-01-07 (×13): 0.5 mg via RESPIRATORY_TRACT
  Filled 2013-12-30 (×22): qty 2

## 2013-12-30 MED ORDER — IPRATROPIUM BROMIDE 0.02 % IN SOLN
0.5000 mg | RESPIRATORY_TRACT | Status: DC
  Administered 2013-12-31: 0.5 mg via RESPIRATORY_TRACT
  Filled 2013-12-30: qty 2.5

## 2013-12-30 MED ORDER — POTASSIUM CHLORIDE ER 10 MEQ PO TBCR
10.0000 meq | EXTENDED_RELEASE_TABLET | Freq: Every day | ORAL | Status: DC
  Administered 2013-12-31 – 2014-01-04 (×4): 10 meq via ORAL
  Filled 2013-12-30 (×6): qty 1

## 2013-12-30 MED ORDER — LORAZEPAM 1 MG PO TABS
1.0000 mg | ORAL_TABLET | Freq: Every day | ORAL | Status: DC
  Administered 2013-12-31 – 2014-01-05 (×7): 1 mg via ORAL
  Filled 2013-12-30 (×7): qty 1

## 2013-12-30 MED ORDER — FLUOXETINE HCL 20 MG PO TABS
20.0000 mg | ORAL_TABLET | Freq: Every day | ORAL | Status: DC
  Administered 2013-12-31 – 2014-01-07 (×8): 20 mg via ORAL
  Filled 2013-12-30 (×8): qty 1

## 2013-12-30 MED ORDER — PREDNISONE 20 MG PO TABS
60.0000 mg | ORAL_TABLET | Freq: Once | ORAL | Status: AC
  Administered 2013-12-30: 60 mg via ORAL
  Filled 2013-12-30: qty 3

## 2013-12-30 MED ORDER — LORATADINE 10 MG PO TABS
10.0000 mg | ORAL_TABLET | Freq: Every day | ORAL | Status: DC
Start: 2013-12-31 — End: 2014-01-07
  Administered 2013-12-31 – 2014-01-07 (×8): 10 mg via ORAL
  Filled 2013-12-30 (×8): qty 1

## 2013-12-30 MED ORDER — INSULIN GLARGINE 100 UNIT/ML ~~LOC~~ SOLN
30.0000 [IU] | Freq: Every day | SUBCUTANEOUS | Status: DC
  Administered 2013-12-31 – 2014-01-01 (×3): 30 [IU] via SUBCUTANEOUS
  Filled 2013-12-30 (×4): qty 0.3

## 2013-12-30 MED ORDER — INSULIN ASPART 100 UNIT/ML ~~LOC~~ SOLN
0.0000 [IU] | Freq: Three times a day (TID) | SUBCUTANEOUS | Status: DC
  Administered 2013-12-31: 7 [IU] via SUBCUTANEOUS

## 2013-12-30 MED ORDER — FAMOTIDINE 20 MG PO TABS
20.0000 mg | ORAL_TABLET | Freq: Every day | ORAL | Status: DC
  Administered 2013-12-31: 20 mg via ORAL
  Filled 2013-12-30: qty 1

## 2013-12-30 MED ORDER — ALBUTEROL SULFATE (2.5 MG/3ML) 0.083% IN NEBU
2.5000 mg | INHALATION_SOLUTION | RESPIRATORY_TRACT | Status: DC
Start: 2013-12-31 — End: 2013-12-31
  Administered 2013-12-31: 2.5 mg via RESPIRATORY_TRACT
  Filled 2013-12-30: qty 3

## 2013-12-30 MED ORDER — PREDNISONE 5 MG PO TABS
5.0000 mg | ORAL_TABLET | Freq: Every day | ORAL | Status: DC
  Administered 2013-12-31 – 2014-01-07 (×8): 5 mg via ORAL
  Filled 2013-12-30 (×8): qty 1

## 2013-12-30 MED ORDER — ALBUTEROL (5 MG/ML) CONTINUOUS INHALATION SOLN
7.5000 mg/h | INHALATION_SOLUTION | Freq: Once | RESPIRATORY_TRACT | Status: AC
  Administered 2013-12-30: 7.5 mg/h via RESPIRATORY_TRACT
  Filled 2013-12-30: qty 20

## 2013-12-30 NOTE — ED Notes (Signed)
Admitting physician at bedside

## 2013-12-30 NOTE — Consult Note (Signed)
Reason for Consult: Chest pain  Requesting Physician: ER  HPI: This is a 73 y.o. female with a past medical history significant for CAD. She has had remote PCIs in early 2000s. Her last cath was Dec 2011 and this showed no critical CAD. She was followed by Dr Lia Foyer but hasn't been back to see [place] since. She sees Dr Elsworth Soho for chronic asthmatic COPD. She came to the ER tonight after she had "sharp" lt chest pain. She says she called out and fell over the couch. She indicates the pain is localized. On arrival she has been treated with [facility]. Her Troponin is negative x 1 and her EKG shows no acute changes.   PMHx:  Past Medical History  Diagnosis Date  . Unspecified essential hypertension   . Pure hypercholesterolemia   . Other symptoms involving cardiovascular system   . Acute myocardial infarction, unspecified site, episode of care unspecified   . Chronic airway obstruction, not elsewhere classified   . Anxiety state, unspecified   . Type II or unspecified type diabetes mellitus without mention of complication, not stated as uncontrolled   . Unspecified asthma(493.90)   . Obstructive sleep apnea   . Edema   . GE reflux   . Coronary artery disease   . Carotid bruit   . Anxiety    Past Surgical History  Procedure Laterality Date  . Appendectomy      FAMHx: Breast cancer, CAD   SOCHx:  reports that she quit smoking about 14 years ago. She does not have any smokeless tobacco history on file. Her alcohol and drug histories are not on file.  ALLERGIES: Allergies  Allergen Reactions  . Atorvastatin     REACTION: muscle aches  . Benzonatate     REACTION: confusion    ROS: Pertinent items are noted in HPI. she admitts she has had some Lt shoulder pain, worse when rotating her arm  HOME MEDICATIONS:  (Not in a hospital admission)  HOSPITAL MEDICATIONS: I have reviewed the patient's current medications.  VITALS: Blood pressure 126/53, pulse 96, temperature 98.3 F (36.8  C), temperature source Oral, resp. rate 20, SpO2 99.00%.  PHYSICAL EXAM: General appearance: alert, cooperative, no distress and moderately obese Neck: no JVD and soft RCA bruit Lungs: decreased breath sounds Heart: regular rate and rhythm Abdomen: obese, non tender Extremities: no edema Pulses: 2+ and symmetric Skin: cool, pale, dry Neurologic: Grossly normal  LABS: Results for orders placed during the hospital encounter of 12/30/13 (from the past 48 hour(s))  CBC WITH DIFFERENTIAL     Status: None   Collection Time    12/30/13  5:27 PM      Result Value Ref Range   WBC 9.1  4.0 - 10.5 K/uL   RBC 4.63  3.87 - 5.11 MIL/uL   Hemoglobin 12.4  12.0 - 15.0 g/dL   HCT 37.0  36.0 - 46.0 %   MCV 79.9  78.0 - 100.0 fL   MCH 26.8  26.0 - 34.0 pg   MCHC 33.5  30.0 - 36.0 g/dL   RDW 14.8  11.5 - 15.5 %   Platelets 249  150 - 400 K/uL   Neutrophils Relative % 74  43 - 77 %   Neutro Abs 6.7  1.7 - 7.7 K/uL   Lymphocytes Relative 18  12 - 46 %   Lymphs Abs 1.7  0.7 - 4.0 K/uL   Monocytes Relative 7  3 - 12 %   Monocytes Absolute 0.6  0.1 - 1.0 K/uL   Eosinophils Relative 1  0 - 5 %   Eosinophils Absolute 0.1  0.0 - 0.7 K/uL   Basophils Relative 0  0 - 1 %   Basophils Absolute 0.0  0.0 - 0.1 K/uL  BASIC METABOLIC PANEL     Status: Abnormal   Collection Time    12/30/13  5:27 PM      Result Value Ref Range   Sodium 141  137 - 147 mEq/L   Potassium 3.7  3.7 - 5.3 mEq/L   Chloride 102  96 - 112 mEq/L   CO2 25  19 - 32 mEq/L   Glucose, Bld 267 (*) 70 - 99 mg/dL   BUN 17  6 - 23 mg/dL   Creatinine, Ser 1.20 (*) 0.50 - 1.10 mg/dL   Calcium 9.7  8.4 - 10.5 mg/dL   GFR calc non Af Amer 43 (*) >90 mL/min   GFR calc Af Amer 50 (*) >90 mL/min   Comment: (NOTE)     The eGFR has been calculated using the CKD EPI equation.     This calculation has not been validated in all clinical situations.     eGFR's persistently <90 mL/min signify possible Chronic Kidney     Disease.  TROPONIN I      Status: None   Collection Time    12/30/13  5:28 PM      Result Value Ref Range   Troponin I <0.30  <0.30 ng/mL   Comment:            Due to the release kinetics of cTnI,     a negative result within the first hours     of the onset of symptoms does not rule out     myocardial infarction with certainty.     If myocardial infarction is still suspected,     repeat the test at appropriate intervals.  PRO B NATRIURETIC PEPTIDE     Status: Abnormal   Collection Time    12/30/13  5:28 PM      Result Value Ref Range   Pro B Natriuretic peptide (BNP) 320.8 (*) 0 - 125 pg/mL  I-STAT TROPOININ, ED     Status: None   Collection Time    12/30/13  5:47 PM      Result Value Ref Range   Troponin i, poc 0.01  0.00 - 0.08 ng/mL   Comment 3            Comment: Due to the release kinetics of cTnI,     a negative result within the first hours     of the onset of symptoms does not rule out     myocardial infarction with certainty.     If myocardial infarction is still suspected,     repeat the test at appropriate intervals.  I-STAT CHEM 8, ED     Status: Abnormal   Collection Time    12/30/13  5:48 PM      Result Value Ref Range   Sodium 143  137 - 147 mEq/L   Potassium 3.5 (*) 3.7 - 5.3 mEq/L   Chloride 101  96 - 112 mEq/L   BUN 17  6 - 23 mg/dL   Creatinine, Ser 1.30 (*) 0.50 - 1.10 mg/dL   Glucose, Bld 267 (*) 70 - 99 mg/dL   Calcium, Ion 1.24  1.13 - 1.30 mmol/L   TCO2 24  0 - 100 mmol/L   Hemoglobin 13.6  12.0 - 15.0 g/dL   HCT 40.0  36.0 - 46.0 %    EKG: NSR, no acute changes  IMAGING: Dg Chest 2 View  12/30/2013   CLINICAL DATA:  Left chest pain  EXAM: CHEST  2 VIEW  COMPARISON:  10/25/2011  FINDINGS: Frontal view is apical lordotic in projection. Normal heart size and vascularity. Lungs remain clear. No focal airspace process, collapse or consolidation. No edema, effusion or pneumothorax. Trachea midline. Atherosclerosis of the aorta.  IMPRESSION: No active cardiopulmonary disease.    Electronically Signed   By: Daryll Brod M.D.   On: 12/30/2013 18:23    IMPRESSION:    Dyspnea    At this point the patient is requiring treatment for her asthmatic symptoms. This appears to be the primary issue.        Asthma exacerbation   Chest pain with moderate risk of acute coronary syndrome   DIABETES MELLITUS, TYPE II   Anxiety state, unspecified    CAD - remote LAD and RCA stenting, last cath 12/11- 20% LAD, 40% RCA      There is a history of coronary disease. At this point today there is no evidence of an acute coronary syndrome.    C O P D   HYPERCHOLESTEROLEMIA  IIA   HYPERTENSION   OBSTRUCTIVE SLEEP APNEA- non compliant with C-pap   RECOMMENDATION: Discussed with Dr Ron Parker. Plan to cycle enzymes. No further work up as an in pt if Troponin's remain negative.   Time Spent Directly with Patient: 45 minutes  Erlene Quan [phone] beeper 12/30/2013, 7:24 PM  Patient seen and examined. I agree with the assessment and plan as detailed above. See also my additional thoughts below.   I have modified the note above. I have reviewed all the information. The patient does have coronary disease. At this time there is no evidence of an acute coronary syndrome. The plan will be to watch his cardiac enzymes. If the enzymes are positive, more complete workup will be needed. Otherwise, she can have further cardiac evaluation as an outpatient. We will follow the patient with you in the hospital.  Dola Argyle, MD, [facility] 12/30/2013 8:18 PM

## 2013-12-30 NOTE — H&P (Signed)
[facility] History and Physical  [name] LavCarolyn F Hauter ZOX:[id number] DOB: 12/27/1940 DOA: 12/30/2013  Referring physician: ER physician. PCP: Lupita DawnOREY,JOHN, MD  Chief Complaint: Chest pain.  HPI: [name] Lane is a 73 y.o. female history of CAD status post stenting, COPD/asthma, OSA noncompliant, hyperlipidemia, diabetes mellitus presents to the ER with complaints of chest pain. Patient started experiencing chest pain in the afternoon. Pain was left-sided stabbing in nature nonradiating present even at rest. Patient's chest pain improved with sublingual nitroglycerin and presently chest pain-free. Cardiac markers have been negative EKG shows nonspecific findings. On-call cardiologist was consulted and at this time patient has been admitted for further management. Patient denies any nausea vomiting abdominal pain or diarrhea. Patient has wheezing on examination which patient states is chronic.   Review of Systems: As presented in the history of presenting illness, rest negative.  Past Medical History  Diagnosis Date  . Unspecified essential hypertension   . Pure hypercholesterolemia   . Other symptoms involving cardiovascular system   . Acute myocardial infarction, unspecified site, episode of care unspecified   . Chronic airway obstruction, not elsewhere classified   . Anxiety state, unspecified   . Type II or unspecified type diabetes mellitus without mention of complication, not stated as uncontrolled   . Unspecified asthma(493.90)   . Obstructive sleep apnea   . Edema   . GE reflux   . Coronary artery disease   . Carotid bruit   . Anxiety    Past Surgical History  Procedure Laterality Date  . Appendectomy     Social History:  reports that she quit smoking about 14 years ago. She does not have any smokeless tobacco history on file. Her alcohol and drug histories are not on file. Where does patient live home. Can patient participate in ADLs? Yes.  Allergies  Allergen  Reactions  . Atorvastatin Other (See Comments)    REACTION: muscle aches  . Benzonatate Other (See Comments)    REACTION: confusion    Family History:  Family History  Problem Relation Age of Onset  . Breast cancer Mother   . Heart disease        Prior to Admission medications   Medication Sig Start Date End Date Taking? Authorizing Provider  acetaminophen (TYLENOL) 325 MG tablet Take 975 mg by mouth every 6 (six) hours as needed for moderate pain.   Yes Historical Provider, MD  albuterol (PROVENTIL) (2.5 MG/3ML) 0.083% nebulizer solution Take 2.5 mg by nebulization every 6 (six) hours as needed for wheezing or shortness of breath.   Yes Historical Provider, MD  aspirin 81 MG tablet Take 81 mg by mouth daily.     Yes Historical Provider, MD  budesonide (PULMICORT) 0.5 MG/2ML nebulizer solution Take 0.5 mg by nebulization 2 (two) times daily.   Yes Historical Provider, MD  cetirizine (ZYRTEC) 10 MG tablet Take 10 mg by mouth daily.     Yes Historical Provider, MD  FLUoxetine (PROZAC) 20 MG tablet Take 20 mg by mouth daily.    Yes Historical Provider, MD  furosemide (LASIX) 20 MG tablet Take 40-60 mg by mouth 2 (two) times daily. Usually takes 40mg  twice daily, but Can take addt'l 20mg  if needed for edema   Yes Historical Provider, MD  insulin glargine (LANTUS) 100 UNIT/ML injection Inject 30 Units into the skin at bedtime.    Yes Historical Provider, MD  insulin lispro (HUMALOG) 100 UNIT/ML injection Inject 0-12 Units into the skin 3 (three) times daily with meals. If BG  less than 150, no insulin given 151-200, takes 3 units 201-250, takes 5 units  251-300, takes 8 units 301-350, takes 10 units 351-400, takes 12 units 400+, call MD   Yes Historical Provider, MD  LORazepam (ATIVAN) 1 MG tablet Take 1 mg by mouth at bedtime.    Yes Historical Provider, MD  nebivolol (BYSTOLIC) 5 MG tablet Take 5 mg by mouth daily.   Yes Historical Provider, MD  NON FORMULARY Inject 2-3 each into the  muscle every Friday. "allergy shots-[name]"   Yes Historical Provider, MD  potassium chloride (KLOR-CON 10) 10 MEQ tablet Take 1 tablet (10 mEq total) by mouth daily. 01/29/13  Yes Herby Abraham, MD  pravastatin (PRAVACHOL) 40 MG tablet Take 1 tablet (40 mg total) by mouth daily. 09/23/12  Yes Herby Abraham, MD  predniSONE (DELTASONE) 10 MG tablet Take 0.5 tablets (5 mg total) by mouth daily. 10/15/12  Yes Oretha Milch, MD  ranitidine (ZANTAC) 150 MG tablet Take 150 mg by mouth every morning.    Yes Historical Provider, MD  roflumilast (DALIRESP) 500 MCG TABS tablet Take 500 mcg by mouth daily.   Yes Historical Provider, MD  tiotropium (SPIRIVA) 18 MCG inhalation capsule Place 18 mcg into inhaler and inhale daily.   Yes Historical Provider, MD    Physical Exam: Filed Vitals:   12/30/13 2055 12/30/13 2125 12/30/13 2130 12/30/13 2228  BP: 144/57 121/56 125/64 167/65  Pulse: 107 105 107 104  Temp:    97.9 F (36.6 C)  TempSrc:    Oral  Resp: 24 18 22 18   SpO2:  96% 95% 97%     General:  Well-developed and nourished.  Eyes: Anicteric no pallor.  ENT: No discharge from ears eyes nose mouth.  Neck: No mass felt.  Cardiovascular: S1-S2 heard.  Respiratory: Bilateral expiratory wheeze no crepitations.  Abdomen: Soft nontender bowel sounds present.  Skin: No rash.  Musculoskeletal: No edema.  Psychiatric: Appears normal.  Neurologic: Alert awake oriented to time place and person. Moves all extremities.  Labs on Admission:  Basic Metabolic Panel:  Recent Labs Lab 12/30/13 1727 12/30/13 1748  NA 141 143  K 3.7 3.5*  CL 102 101  CO2 25  --   GLUCOSE 267* 267*  BUN 17 17  CREATININE 1.20* 1.30*  CALCIUM 9.7  --    Liver Function Tests: No results found for this basename: AST, ALT, ALKPHOS, BILITOT, PROT, ALBUMIN,  in the last 168 hours No results found for this basename: LIPASE, AMYLASE,  in the last 168 hours No results found for this basename: AMMONIA,  in the  last 168 hours CBC:  Recent Labs Lab 12/30/13 1727 12/30/13 1748  WBC 9.1  --   NEUTROABS 6.7  --   HGB 12.4 13.6  HCT 37.0 40.0  MCV 79.9  --   PLT 249  --    Cardiac Enzymes:  Recent Labs Lab 12/30/13 1728  TROPONINI <0.30    BNP (last 3 results)  Recent Labs  12/30/13 1728  PROBNP 320.8*   CBG:  Recent Labs Lab 12/30/13 2105  GLUCAP 231*    Radiological Exams on Admission: Dg Chest 2 View  12/30/2013   CLINICAL DATA:  Left chest pain  EXAM: CHEST  2 VIEW  COMPARISON:  10/25/2011  FINDINGS: Frontal view is apical lordotic in projection. Normal heart size and vascularity. Lungs remain clear. No focal airspace process, collapse or consolidation. No edema, effusion or pneumothorax. Trachea midline. Atherosclerosis of the aorta.  IMPRESSION: No active cardiopulmonary disease.   Electronically Signed   By: Ruel Favors M.D.   On: 12/30/2013 18:23    EKG: Independently reviewed. Normal sinus rhythm with minimal ST depression in the lateral leads.  Assessment/Plan Principal Problem:   Chest pain Active Problems:   DIABETES MELLITUS, TYPE II   HYPERCHOLESTEROLEMIA  IIA   Anxiety state, unspecified   HYPERTENSION   CAD - remote LAD and RCA stenting, last cath 12/11- 20% LAD, 40% RCA   Asthma exacerbation   C O P D   OBSTRUCTIVE SLEEP APNEA- non compliant with C-pap   Chest pain with moderate risk of acute coronary syndrome   Dyspnea   1. Chest pain with history of CAD - presently chest pain-free. Cycle cardiac markers. Aspirin. I have placed patient n.p.o. past 4:00 AM in anticipation of possible procedures. 2. COPD/asthma exacerbation - patient had received one dose of prednisone 60 mg in the ER. Patient chronically takes prednisone 5 mg daily. I have continued patient on Pulmicort and nebulizers for now. May need further dose of steroids if wheezing. 3. Diabetes mellitus type 2 - closely follow CBGs. Patient has been placed on home medications with  sliding-scale coverage. 4. Hypertension - continue home medications. 5. Probable chronic kidney disease - follow metabolic panel closely. 6. OSA noncompliant with CPAP. 7. Hyperlipidemia - continue statins.    Code Status: Full code.  Family Communication: None.  Disposition Plan: Admit for observation.    KAKRAKANDY,ARSHAD N. [facility] Pager [phone].  If 7PM-7AM, please contact night-coverage [email or web address] Password [facility] 12/30/2013, 11:21 PM

## 2013-12-30 NOTE — ED Notes (Signed)
PT reports her CP started at 1530 today. CP at first was 9/10 and radiated to back . Pt took 324 ASA and 3 SL NGT with relief of CP . [facility]

## 2013-12-30 NOTE — ED Provider Notes (Signed)
CSN: [id number]     Arrival date & time 12/30/13  1659 History   First MD Initiated Contact with Patient 12/30/13 1716     Chief Complaint  Patient presents with  . Chest Pain      Patient is a 73 y.o. female presenting with chest pain. The history is provided by the patient.  Chest Pain Pain location:  L chest Pain quality: burning, radiating and sharp   Pain radiates to:  L arm and upper back Pain radiates to the back: yes   Pain severity:  Moderate Onset quality:  Sudden Duration:  2 hours Timing:  Intermittent Progression:  Waxing and waning Chronicity:  New Associated symptoms: nausea   Associated symptoms: no abdominal pain, no back pain, no cough, no fever, no headache, no palpitations, no shortness of breath, not vomiting and no weakness     Past Medical History  Diagnosis Date  . Unspecified essential hypertension   . Pure hypercholesterolemia   . Other symptoms involving cardiovascular system   . Acute myocardial infarction, unspecified site, episode of care unspecified   . Chronic airway obstruction, not elsewhere classified   . Anxiety state, unspecified   . Type II or unspecified type diabetes mellitus without mention of complication, not stated as uncontrolled   . Unspecified asthma(493.90)   . Obstructive sleep apnea   . Edema   . GE reflux   . Coronary artery disease   . Carotid bruit   . Anxiety    Past Surgical History  Procedure Laterality Date  . Appendectomy     Family History  Problem Relation Age of Onset  . Breast cancer Mother   . Heart disease     History  Substance Use Topics  . Smoking status: Former Smoker    Quit date: 10/31/1999  . Smokeless tobacco: Not on file  . Alcohol Use: Not on file   OB History   Grav Para Term Preterm Abortions TAB SAB Ect Mult Living                 Review of Systems  Constitutional: Negative for fever, chills, activity change and appetite change.  HENT: Negative for congestion, ear pain and  rhinorrhea.   Eyes: Negative for pain.  Respiratory: Positive for wheezing. Negative for cough and shortness of breath.   Cardiovascular: Positive for chest pain. Negative for palpitations and leg swelling.  Gastrointestinal: Positive for nausea. Negative for vomiting and abdominal pain.  Genitourinary: Negative for dysuria, difficulty urinating and pelvic pain.  Musculoskeletal: Negative for back pain and neck pain.  Skin: Negative for rash and wound.  Neurological: Negative for weakness and headaches.  Psychiatric/Behavioral: Negative for behavioral problems, confusion and agitation.      Allergies  Atorvastatin and Benzonatate  Home Medications   Current Outpatient Rx  Name  Route  Sig  Dispense  Refill  . acetaminophen (TYLENOL) 325 MG tablet   Oral   Take 975 mg by mouth every 6 (six) hours as needed for moderate pain.         Marland Kitchen albuterol (PROVENTIL) (2.5 MG/3ML) 0.083% nebulizer solution   Nebulization   Take 2.5 mg by nebulization every 6 (six) hours as needed for wheezing or shortness of breath.         Marland Kitchen aspirin 81 MG tablet   Oral   Take 81 mg by mouth daily.           . budesonide (PULMICORT) 0.5 MG/2ML nebulizer solution   Nebulization  Take 0.5 mg by nebulization 2 (two) times daily.         . cetirizine (ZYRTEC) 10 MG tablet   Oral   Take 10 mg by mouth daily.           Marland Kitchen FLUoxetine (PROZAC) 20 MG tablet   Oral   Take 20 mg by mouth daily.          . furosemide (LASIX) 20 MG tablet   Oral   Take 40-60 mg by mouth 2 (two) times daily. Usually takes 40mg  twice daily, but Can take addt'l 20mg  if needed for edema         . insulin glargine (LANTUS) 100 UNIT/ML injection   Subcutaneous   Inject 30 Units into the skin at bedtime.          . insulin lispro (HUMALOG) 100 UNIT/ML injection   Subcutaneous   Inject 0-12 Units into the skin 3 (three) times daily with meals. If BG less than 150, no insulin given 151-200, takes 3  units 201-250, takes 5 units  251-300, takes 8 units 301-350, takes 10 units 351-400, takes 12 units 400+, call MD         . LORazepam (ATIVAN) 1 MG tablet   Oral   Take 1 mg by mouth at bedtime.          . nebivolol (BYSTOLIC) 5 MG tablet   Oral   Take 5 mg by mouth daily.         . NON FORMULARY   Intramuscular   Inject 2-3 each into the muscle every Friday. "allergy shots-[name]"         . potassium chloride (KLOR-CON 10) 10 MEQ tablet   Oral   Take 1 tablet (10 mEq total) by mouth daily.   15 tablet   3     Patient needs to make an appointment.No further re ...   . pravastatin (PRAVACHOL) 40 MG tablet   Oral   Take 1 tablet (40 mg total) by mouth daily.   30 tablet   1     Patient needs to call for appointment   . predniSONE (DELTASONE) 10 MG tablet   Oral   Take 0.5 tablets (5 mg total) by mouth daily.   30 tablet   0   . ranitidine (ZANTAC) 150 MG tablet   Oral   Take 150 mg by mouth every morning.          . roflumilast (DALIRESP) 500 MCG TABS tablet   Oral   Take 500 mcg by mouth daily.         Marland Kitchen tiotropium (SPIRIVA) 18 MCG inhalation capsule   Inhalation   Place 18 mcg into inhaler and inhale daily.          BP 141/61  Pulse 83  Temp(Src) 98.3 F (36.8 C) (Oral)  Resp 18  SpO2 99% Physical Exam  Constitutional: She is oriented to person, place, and time. She appears well-developed and well-nourished. No distress.  HENT:  Head: Normocephalic and atraumatic.  Nose: Nose normal.  Mouth/Throat: Oropharynx is clear and moist.  Eyes: EOM are normal. Pupils are equal, round, and reactive to light.  Neck: Normal range of motion. Neck supple. No tracheal deviation present.  Cardiovascular: Normal rate, regular rhythm, normal heart sounds and intact distal pulses.   Pulmonary/Chest: Effort normal. She has wheezes ( diffuse mild). She has no rales. She exhibits tenderness.  Abdominal: Soft. Bowel sounds are normal. She exhibits no  distension. There is no tenderness. There is no rebound and no guarding.  Musculoskeletal: Normal range of motion. She exhibits no tenderness.  Neurological: She is alert and oriented to person, place, and time.  Skin: Skin is warm and dry. No rash noted.  Psychiatric: She has a normal mood and affect. Her behavior is normal.    ED Course  Procedures (including critical care time) Labs Review Labs Reviewed  CBC WITH DIFFERENTIAL  TROPONIN I  BASIC METABOLIC PANEL  PRO B NATRIURETIC PEPTIDE  I-STAT CHEM 8, ED  I-STAT TROPOININ, ED  Rosezena SensorI-STAT TROPOININ, ED   Imaging Review Dg Chest 2 View  12/30/2013   CLINICAL DATA:  Left chest pain  EXAM: CHEST  2 VIEW  COMPARISON:  10/25/2011  FINDINGS: Frontal view is apical lordotic in projection. Normal heart size and vascularity. Lungs remain clear. No focal airspace process, collapse or consolidation. No edema, effusion or pneumothorax. Trachea midline. Atherosclerosis of the aorta.  IMPRESSION: No active cardiopulmonary disease.   Electronically Signed   By: Ruel Favorsrevor  Shick M.D.   On: 12/30/2013 18:23     Date: 12/30/2013  Rate: 87  Rhythm: normal sinus rhythm  QRS Axis: normal  Intervals: normal  ST/T Wave abnormalities: nonspecific ST changes  Conduction Disutrbances:none  Narrative Interpretation:   Old EKG Reviewed: unchanged   MDM   Final diagnoses:  Chest pain  Wheeze  Chest pain with moderate risk of acute coronary syndrome  OBSTRUCTIVE SLEEP APNEA- non compliant with C-pap    73 yo F in NAD AFVSS who presents with CP. Patient is a HEART score of 5. Initial troponin wnl.  ASA and nitro given. CP improved with nitro. Patient CP free now.  Some radiation into her back but patient appears well , symmetric blt radial pulses, no back pain now. Doubt aortic dissection. Mild wheeze throughout on exam in this patient with hx of asthma. Albuterol and ipratropium given in ED. Case co managed with my attending Dr. Loretha StaplerWofford. 6:24 PM Cardiology  consulted.   Patient seen by Dr. Myrtis SerKatz (cardiology) recommend cycle enzymes. No further work up as an in pt if Troponin's remain negative. Hospitalist team to admit.   At transfer patient remains CP free. Wheeze improved with albuterol/ipratropium. Administered prednisone 60 mg. Patient up dated of plan.      Nadara MustardJulio Esmay Amspacher, MD 12/30/13 [phone]

## 2013-12-31 DIAGNOSIS — E7800 Pure hypercholesterolemia, unspecified: Secondary | ICD-10-CM

## 2013-12-31 DIAGNOSIS — G4730 Sleep apnea, unspecified: Secondary | ICD-10-CM

## 2013-12-31 LAB — COMPREHENSIVE METABOLIC PANEL
ALBUMIN: 3.4 g/dL — AB (ref 3.5–5.2)
ALT: 13 U/L (ref 0–35)
AST: 16 U/L (ref 0–37)
Alkaline Phosphatase: 56 U/L (ref 39–117)
BUN: 16 mg/dL (ref 6–23)
CALCIUM: 9.3 mg/dL (ref 8.4–10.5)
CO2: 19 mEq/L (ref 19–32)
CREATININE: 0.99 mg/dL (ref 0.50–1.10)
Chloride: 99 mEq/L (ref 96–112)
GFR calc Af Amer: 63 mL/min — ABNORMAL LOW (ref >90)
GFR calc non Af Amer: 55 mL/min — ABNORMAL LOW (ref >90)
Glucose, Bld: 383 mg/dL — ABNORMAL HIGH (ref 70–99)
Potassium: 4 mEq/L (ref 3.7–5.3)
Sodium: 139 mEq/L (ref 137–147)
TOTAL PROTEIN: 7 g/dL (ref 6.0–8.3)
Total Bilirubin: 0.3 mg/dL (ref 0.3–1.2)

## 2013-12-31 LAB — CBC WITH DIFFERENTIAL/PLATELET
Basophils Absolute: 0 10*3/uL (ref 0.0–0.1)
Basophils Relative: 0 % (ref 0–1)
EOS ABS: 0 10*3/uL (ref 0.0–0.7)
EOS PCT: 0 % (ref 0–5)
HCT: 37.1 % (ref 36.0–46.0)
HEMOGLOBIN: 12.4 g/dL (ref 12.0–15.0)
Lymphocytes Relative: 5 % — ABNORMAL LOW (ref 12–46)
Lymphs Abs: 0.5 10*3/uL — ABNORMAL LOW (ref 0.7–4.0)
MCH: 26.6 pg (ref 26.0–34.0)
MCHC: 33.4 g/dL (ref 30.0–36.0)
MCV: 79.6 fL (ref 78.0–100.0)
MONOS PCT: 1 % — AB (ref 3–12)
Monocytes Absolute: 0.1 10*3/uL (ref 0.1–1.0)
NEUTROS PCT: 94 % — AB (ref 43–77)
Neutro Abs: 10.1 10*3/uL — ABNORMAL HIGH (ref 1.7–7.7)
Platelets: 279 10*3/uL (ref 150–400)
RBC: 4.66 MIL/uL (ref 3.87–5.11)
RDW: 14.7 % (ref 11.5–15.5)
WBC: 10.8 10*3/uL — ABNORMAL HIGH (ref 4.0–10.5)

## 2013-12-31 LAB — GLUCOSE, CAPILLARY
GLUCOSE-CAPILLARY: 332 mg/dL — AB (ref 70–99)
GLUCOSE-CAPILLARY: 323 mg/dL — AB (ref 70–99)
GLUCOSE-CAPILLARY: 143 mg/dL — AB (ref 70–99)
Glucose-Capillary: 241 mg/dL — ABNORMAL HIGH (ref 70–99)
Glucose-Capillary: 168 mg/dL — ABNORMAL HIGH (ref 70–99)

## 2013-12-31 LAB — URINALYSIS, ROUTINE W REFLEX MICROSCOPIC
Bilirubin Urine: NEGATIVE
Glucose, UA: 100 mg/dL — AB
Hgb urine dipstick: NEGATIVE
Ketones, ur: NEGATIVE mg/dL
Leukocytes, UA: NEGATIVE
Nitrite: NEGATIVE
PROTEIN: NEGATIVE mg/dL
Specific Gravity, Urine: 1.014 (ref 1.005–1.030)
Urobilinogen, UA: 0.2 mg/dL (ref 0.0–1.0)
pH: 5 (ref 5.0–8.0)

## 2013-12-31 LAB — TROPONIN I
TROPONIN I: 0.82 ng/mL — AB (ref <0.30)
Troponin I: <0.3 ng/mL (ref <0.30)

## 2013-12-31 LAB — TSH: TSH: 0.255 u[IU]/mL — ABNORMAL LOW (ref 0.350–4.500)

## 2013-12-31 LAB — HEMOGLOBIN A1C
Hgb A1c MFr Bld: 8.9 % — ABNORMAL HIGH (ref <5.7)
Mean Plasma Glucose: 209 mg/dL — ABNORMAL HIGH (ref <117)

## 2013-12-31 MED ORDER — ALBUTEROL SULFATE (2.5 MG/3ML) 0.083% IN NEBU: 2.5000 mg | INHALATION_SOLUTION | Freq: Four times a day (QID) | RESPIRATORY_TRACT | Status: DC

## 2013-12-31 MED ORDER — GI COCKTAIL ~~LOC~~
30.0000 mL | Freq: Three times a day (TID) | ORAL | Status: DC | PRN
  Filled 2013-12-31: qty 30

## 2013-12-31 MED ORDER — FAMOTIDINE 20 MG PO TABS
20.0000 mg | ORAL_TABLET | Freq: Two times a day (BID) | ORAL | Status: DC
  Administered 2013-12-31 – 2014-01-07 (×14): 20 mg via ORAL
  Filled 2013-12-31 (×16): qty 1

## 2013-12-31 MED ORDER — LEVALBUTEROL HCL 0.63 MG/3ML IN NEBU
0.6300 mg | INHALATION_SOLUTION | Freq: Four times a day (QID) | RESPIRATORY_TRACT | Status: DC | PRN
  Filled 2013-12-31: qty 3

## 2013-12-31 MED ORDER — INSULIN ASPART 100 UNIT/ML ~~LOC~~ SOLN
0.0000 [IU] | Freq: Three times a day (TID) | SUBCUTANEOUS | Status: DC
  Administered 2013-12-31: 7 [IU] via SUBCUTANEOUS
  Administered 2013-12-31: 4 [IU] via SUBCUTANEOUS
  Administered 2014-01-01: 3 [IU] via SUBCUTANEOUS
  Administered 2014-01-01: 7 [IU] via SUBCUTANEOUS
  Administered 2014-01-02: 3 [IU] via SUBCUTANEOUS
  Administered 2014-01-02: 4 [IU] via SUBCUTANEOUS
  Administered 2014-01-03: 7 [IU] via SUBCUTANEOUS
  Administered 2014-01-03 – 2014-01-05 (×6): 4 [IU] via SUBCUTANEOUS
  Administered 2014-01-05 (×2): 7 [IU] via SUBCUTANEOUS
  Administered 2014-01-06: 19:00:00 3 [IU] via SUBCUTANEOUS
  Administered 2014-01-06 – 2014-01-07 (×3): 4 [IU] via SUBCUTANEOUS

## 2013-12-31 MED ORDER — LEVALBUTEROL HCL 0.63 MG/3ML IN NEBU
0.6300 mg | INHALATION_SOLUTION | Freq: Four times a day (QID) | RESPIRATORY_TRACT | Status: DC
  Administered 2013-12-31 (×3): 0.63 mg via RESPIRATORY_TRACT
  Filled 2013-12-31 (×7): qty 3

## 2013-12-31 MED ORDER — IPRATROPIUM BROMIDE 0.02 % IN SOLN: 0.5000 mg | Freq: Four times a day (QID) | RESPIRATORY_TRACT | Status: DC

## 2013-12-31 MED ORDER — REGADENOSON 0.4 MG/5ML IV SOLN
0.4000 mg | Freq: Once | INTRAVENOUS | Status: AC
  Administered 2014-01-01: 0.4 mg via INTRAVENOUS
  Filled 2013-12-31: qty 5

## 2013-12-31 NOTE — Progress Notes (Signed)
[facility] PROGRESS NOTE  [name] Lane JYN:[id number] DOB: 02/05/1941 DOA: 12/30/2013 PCP: Lupita DawnOREY,JOHN, MD  Assessment/Plan: #1 chest pain Patient currently chest pain-free. Cardiac enzymes were negative x3. Full troponin came back elevated at 0.82. Patient has been seen by cardiology. Patient to be scheduled for inpatient echo and nuclear stress test. Cardiology following and appreciate input and recommendations.  #2 COPD/asthma Stable. No wheezing noted on examination. Continue Pulmicort and nebulizers for now. Follow.  #3 type 2 diabetes Check a hemoglobin A1c. Continue home dose of Lantus. Sliding scale insulin.  #4 hypertension Continue Lasix.  #5 hyperlipidemia Check a fasting lipid panel. Continue statin.  #6 obstructive sleep apnea Place on CPAP each bedtime.  #7 prophylaxis Pepcid for GI prophylaxis. Lovenox/SCDs  for DVT prophylaxis.  Code Status: Full Family Communication: Updated patient and son at bedside. Disposition Plan: Home when medically stable.   Consultants:  Cardiology: Dr. Myrtis SerKatz 12/30/2013  Procedures:  Chest x-ray 12/30/2013    Antibiotics:  None  HPI/Subjective: Patient denies any current chest pain.  Objective: Filed Vitals:   12/31/13 0938  BP: 103/72  Pulse: 111  Temp:   Resp:     Intake/Output Summary (Last 24 hours) at 12/31/13 1129 Last data filed at 12/31/13 0500  Gross per 24 hour  Intake      0 ml  Output    400 ml  Net   -400 ml   Filed Weights   12/31/13 0945  Weight: 90.946 kg (200 lb 8 oz)    Exam:   General: NAD  Cardiovascular: RRR  Respiratory: CTAB  Abdomen: Soft, nontender, nondistended, positive bowel sounds.  Musculoskeletal: No clubbing cyanosis or edema.  Data Reviewed: Basic Metabolic Panel:  Recent Labs Lab 12/30/13 1727 12/30/13 1748 12/31/13 0520  NA 141 143 139  K 3.7 3.5* 4.0  CL 102 101 99  CO2 25  --  19  GLUCOSE 267* 267* 383*  BUN 17 17 16   CREATININE 1.20*  1.30* 0.99  CALCIUM 9.7  --  9.3   Liver Function Tests:  Recent Labs Lab 12/31/13 0520  AST 16  ALT 13  ALKPHOS 56  BILITOT 0.3  PROT 7.0  ALBUMIN 3.4*   No results found for this basename: LIPASE, AMYLASE,  in the last 168 hours No results found for this basename: AMMONIA,  in the last 168 hours CBC:  Recent Labs Lab 12/30/13 1727 12/30/13 1748 12/31/13 0520  WBC 9.1  --  10.8*  NEUTROABS 6.7  --  10.1*  HGB 12.4 13.6 12.4  HCT 37.0 40.0 37.1  MCV 79.9  --  79.6  PLT 249  --  279   Cardiac Enzymes:  Recent Labs Lab 12/30/13 1728 12/31/13 0050 12/31/13 0517  TROPONINI <0.30 <0.30 <0.30   BNP (last 3 results)  Recent Labs  12/30/13 1728  PROBNP 320.8*   CBG:  Recent Labs Lab 12/30/13 2105 12/31/13 0105 12/31/13 0723 12/31/13 1122  GLUCAP 231* 332* 323* 241*    No results found for this or any previous visit (from the past 240 hour(s)).   Studies: Dg Chest 2 View  12/30/2013   CLINICAL DATA:  Left chest pain  EXAM: CHEST  2 VIEW  COMPARISON:  10/25/2011  FINDINGS: Frontal view is apical lordotic in projection. Normal heart size and vascularity. Lungs remain clear. No focal airspace process, collapse or consolidation. No edema, effusion or pneumothorax. Trachea midline. Atherosclerosis of the aorta.  IMPRESSION: No active cardiopulmonary disease.   Electronically Signed  By: Ruel Favors M.D.   On: 12/30/2013 18:23    Scheduled Meds: . aspirin  324 mg Oral Once  . aspirin EC  325 mg Oral Daily  . budesonide  0.5 mg Nebulization BID  . enoxaparin (LOVENOX) injection  40 mg Subcutaneous Q24H  . famotidine  20 mg Oral BID  . FLUoxetine  20 mg Oral Daily  . furosemide  40-60 mg Oral BID  . insulin aspart  0-20 Units Subcutaneous TID WC  . insulin glargine  30 Units Subcutaneous QHS  . levalbuterol  0.63 mg Nebulization Q6H  . loratadine  10 mg Oral Daily  . LORazepam  1 mg Oral QHS  . nebivolol  5 mg Oral Daily  . potassium chloride  10 mEq  Oral Daily  . predniSONE  5 mg Oral Daily  . simvastatin  20 mg Oral q1800   Continuous Infusions:   Principal Problem:   Chest pain Active Problems:   DIABETES MELLITUS, TYPE II   HYPERCHOLESTEROLEMIA  IIA   Anxiety state, unspecified   HYPERTENSION   CAD - remote LAD and RCA stenting, last cath 12/11- 20% LAD, 40% RCA   Asthma exacerbation   C O P D   OBSTRUCTIVE SLEEP APNEA- non compliant with C-pap   Chest pain with moderate risk of acute coronary syndrome   Dyspnea    Time spent: 53 MINS    [facility] MD [facility] Pager [phone]. If 7PM-7AM, please contact night-coverage at [email or web address], password [facility] 12/31/2013, 11:29 AM  LOS: 1 day

## 2013-12-31 NOTE — Progress Notes (Addendum)
Troponin: 0.82  Dr.Kelly made aware; awaiting orders

## 2013-12-31 NOTE — Progress Notes (Signed)
Subjective: Still with a "funny feeling" in left side of chest. Pt has difficulty describing the character of the pain. Mild SOB. Using [place].   Objective: Vital signs in last 24 hours: Temp:  [97.9 F (36.6 C)-98.4 F (36.9 C)] 98.4 F (36.9 C) (03/04 0536) Pulse Rate:  [83-112] 111 (03/04 0938) Resp:  [18-24] 18 (03/04 0536) BP: (103-167)/(53-72) 103/72 mmHg (03/04 0938) SpO2:  [95 %-100 %] 97 % (03/04 0846) Weight:  [200 lb 8 oz (90.946 kg)] 200 lb 8 oz (90.946 kg) (03/04 0945) Last BM Date: 12/31/13  Intake/Output from previous day: 03/03 0701 - 03/04 0700 In: -  Out: 400 [Urine:400] Intake/Output this shift:    Medications Current Facility-Administered Medications  Medication Dose Route Frequency Provider Last Rate Last Dose  . aspirin chewable tablet 324 mg  324 mg Oral Once Nadara MustardJulio Arrieta, MD      . aspirin EC tablet 325 mg  325 mg Oral Daily Eduard ClosArshad N Kakrakandy, MD   325 mg at 12/31/13 [id number]  . budesonide (PULMICORT) nebulizer solution 0.5 mg  0.5 mg Nebulization BID Eduard ClosArshad N Kakrakandy, MD   0.5 mg at 12/31/13 0846  . enoxaparin (LOVENOX) injection 40 mg  40 mg Subcutaneous Q24H Eduard ClosArshad N Kakrakandy, MD      . famotidine (PEPCID) tablet 20 mg  20 mg Oral Daily Eduard ClosArshad N Kakrakandy, MD   20 mg at 12/31/13 [phone]  . FLUoxetine (PROZAC) tablet 20 mg  20 mg Oral Daily Eduard ClosArshad N Kakrakandy, MD   20 mg at 12/31/13 [id number]  . furosemide (LASIX) tablet 40-60 mg  40-60 mg Oral BID Eduard ClosArshad N Kakrakandy, MD   40 mg at 12/31/13 0721  . insulin aspart (novoLOG) injection 0-20 Units  0-20 Units Subcutaneous TID WC Rodolph Bonganiel V Thompson, MD      . insulin glargine (LANTUS) injection 30 Units  30 Units Subcutaneous QHS Eduard ClosArshad N Kakrakandy, MD   30 Units at 12/31/13 0034  . levalbuterol (XOPENEX) nebulizer solution 0.63 mg  0.63 mg Nebulization Q6H Eduard ClosArshad N Kakrakandy, MD   0.63 mg at 12/31/13 0846  . levalbuterol (XOPENEX) nebulizer solution 0.63 mg  0.63 mg Nebulization Q6H PRN Eduard ClosArshad N Kakrakandy,  MD      . loratadine (CLARITIN) tablet 10 mg  10 mg Oral Daily Eduard ClosArshad N Kakrakandy, MD   10 mg at 12/31/13 [id number]  . LORazepam (ATIVAN) tablet 1 mg  1 mg Oral QHS Eduard ClosArshad N Kakrakandy, MD   1 mg at 12/31/13 0034  . nebivolol (BYSTOLIC) tablet 5 mg  5 mg Oral Daily Eduard ClosArshad N Kakrakandy, MD   5 mg at 12/31/13 [id number]  . potassium chloride (K-DUR) CR tablet 10 mEq  10 mEq Oral Daily Eduard ClosArshad N Kakrakandy, MD   10 mEq at 12/31/13 [phone]  . predniSONE (DELTASONE) tablet 5 mg  5 mg Oral Daily Eduard ClosArshad N Kakrakandy, MD   5 mg at 12/31/13 [phone]  . simvastatin (ZOCOR) tablet 20 mg  20 mg Oral q1800 Eduard ClosArshad N Kakrakandy, MD        PE: General appearance: alert, cooperative and no distress Neck: no JVD Lungs: decreased breath sounds bilaterally c/w COPD Heart: regular rate and rhythm and 1/6 murmur best heard at RUSB Extremities: no LEE Pulses: 2+ and symmetric Skin: warm and dry Neurologic: Grossly normal  Lab Results:   Recent Labs  12/30/13 1727 12/30/13 1748 12/31/13 0520  WBC 9.1  --  10.8*  HGB 12.4 13.6 12.4  HCT 37.0 40.0 37.1  PLT 249  --  279   BMET  Recent Labs  12/30/13 1727 12/30/13 1748 12/31/13 0520  NA 141 143 139  K 3.7 3.5* 4.0  CL 102 101 99  CO2 25  --  19  GLUCOSE 267* 267* 383*  BUN 17 17 16   CREATININE 1.20* 1.30* 0.99  CALCIUM 9.7  --  9.3   Cardiac Panel (last 3 results)  Recent Labs  12/30/13 1728 12/31/13 0050 12/31/13 0517  TROPONINI <0.30 <0.30 <0.30    Assessment/Plan    Principal Problem:   Chest pain Active Problems:   DIABETES MELLITUS, TYPE II   HYPERCHOLESTEROLEMIA  IIA   Anxiety state, unspecified   HYPERTENSION   CAD - remote LAD and RCA stenting, last cath 12/11- 20% LAD, 40% RCA   Asthma exacerbation   C O P D   OBSTRUCTIVE SLEEP APNEA- non compliant with C-pap   Chest pain with moderate risk of acute coronary syndrome   Dyspnea  Plan: Enzymes negative x 3. Pt continues to have a "funny feeling" in chest and has difficulty  describing the character her her pain. She has mild SOB, but has COPD. Her last LHC was in 2011 which demonstrated 20% tubular disease in the proximal LAD. There was a stent prior to the takeoff of the septal perforator, which was widely patent. The first diagonal had 30% tubular disease. In the RCA there was 40% tubular lesion just proximal to the origin of the stented region. EF at that time was 60%. Would recommend a NST to further evaluate for ischemia. ? Inpatient vs outpatient. Pt also noted to have a 1-2/6 murmur at the RUSB. Last 2D echo in 201 MD to follow.     LOS: 1 day    [name] Lane 12/31/2013 11:16 AM   Patient seen and examined. Agree with assessment and plan. Chest pain was distinctly different from prior chest tightness associated with her CAD/Stent. Pain yesterday is very atypical, transient, sharp, nonexertional and not nitrate responsive. Enzymes negative x 3 but 4th troponin just came back at ? 0.82. Pt is pain free. Will check ECG. I was going to recommend outpatient testing, but with the last troponin plan inpatient echo and nuclear study.  [name] Bihari, MD, [facility] 12/31/2013 12:23 PM

## 2013-12-31 NOTE — Progress Notes (Signed)
UR completed. Patient changed to inpatient- + Troponin

## 2014-01-01 ENCOUNTER — Inpatient Hospital Stay (HOSPITAL_COMMUNITY): Payer: Medicare Other

## 2014-01-01 DIAGNOSIS — R079 Chest pain, unspecified: Secondary | ICD-10-CM

## 2014-01-01 HISTORY — PX: NM MYOVIEW LTD: HXRAD82

## 2014-01-01 LAB — LIPID PANEL
CHOL/HDL RATIO: 4 ratio
Cholesterol: 215 mg/dL — ABNORMAL HIGH (ref 0–200)
HDL: 54 mg/dL (ref >39)
LDL Cholesterol: 110 mg/dL — ABNORMAL HIGH (ref 0–99)
Triglycerides: 256 mg/dL — ABNORMAL HIGH (ref <150)
VLDL: 51 mg/dL — AB (ref 0–40)

## 2014-01-01 LAB — CBC
HCT: 37.6 % (ref 36.0–46.0)
Hemoglobin: 12.7 g/dL (ref 12.0–15.0)
MCH: 27 pg (ref 26.0–34.0)
MCHC: 33.8 g/dL (ref 30.0–36.0)
MCV: 80 fL (ref 78.0–100.0)
Platelets: 278 K/uL (ref 150–400)
RBC: 4.7 MIL/uL (ref 3.87–5.11)
RDW: 14.9 % (ref 11.5–15.5)
WBC: 11.9 K/uL — ABNORMAL HIGH (ref 4.0–10.5)

## 2014-01-01 LAB — URINE CULTURE

## 2014-01-01 LAB — BASIC METABOLIC PANEL
BUN: 25 mg/dL — AB (ref 6–23)
CALCIUM: 9.8 mg/dL (ref 8.4–10.5)
CHLORIDE: 98 meq/L (ref 96–112)
CO2: 27 mEq/L (ref 19–32)
Creatinine, Ser: 1.29 mg/dL — ABNORMAL HIGH (ref 0.50–1.10)
GFR calc Af Amer: 46 mL/min — ABNORMAL LOW (ref >90)
GFR calc non Af Amer: 40 mL/min — ABNORMAL LOW (ref >90)
Glucose, Bld: 140 mg/dL — ABNORMAL HIGH (ref 70–99)
Potassium: 3.5 mEq/L — ABNORMAL LOW (ref 3.7–5.3)
Sodium: 141 mEq/L (ref 137–147)

## 2014-01-01 LAB — GLUCOSE, CAPILLARY
GLUCOSE-CAPILLARY: 146 mg/dL — AB (ref 70–99)
Glucose-Capillary: 220 mg/dL — ABNORMAL HIGH (ref 70–99)
Glucose-Capillary: 148 mg/dL — ABNORMAL HIGH (ref 70–99)
Glucose-Capillary: 157 mg/dL — ABNORMAL HIGH (ref 70–99)

## 2014-01-01 LAB — HEMOGLOBIN A1C
Hgb A1c MFr Bld: 9 % — ABNORMAL HIGH
Mean Plasma Glucose: 212 mg/dL — ABNORMAL HIGH

## 2014-01-01 MED ORDER — ASPIRIN EC 325 MG PO TBEC: 325.0000 mg | DELAYED_RELEASE_TABLET | Freq: Every day | ORAL | Status: DC

## 2014-01-01 MED ORDER — ASPIRIN 81 MG PO CHEW
81.0000 mg | CHEWABLE_TABLET | ORAL | Status: AC
  Administered 2014-01-02: 81 mg via ORAL
  Filled 2014-01-01: qty 1

## 2014-01-01 MED ORDER — REGADENOSON 0.4 MG/5ML IV SOLN: 0.4000 mg | Freq: Once | INTRAVENOUS | Status: DC

## 2014-01-01 MED ORDER — SODIUM CHLORIDE 0.9 % IJ SOLN: 3.0000 mL | INTRAMUSCULAR | Status: DC | PRN

## 2014-01-01 MED ORDER — REGADENOSON 0.4 MG/5ML IV SOLN
INTRAVENOUS | Status: AC
  Filled 2014-01-01: qty 5

## 2014-01-01 MED ORDER — HYDROCODONE-ACETAMINOPHEN 5-325 MG PO TABS
1.0000 | ORAL_TABLET | ORAL | Status: DC | PRN
  Administered 2014-01-01 – 2014-01-07 (×4): 1 via ORAL
  Filled 2014-01-01 (×4): qty 1

## 2014-01-01 MED ORDER — TECHNETIUM TC 99M SESTAMIBI - CARDIOLITE
30.0000 | Freq: Once | INTRAVENOUS | Status: AC | PRN
  Administered 2014-01-01: 30 via INTRAVENOUS

## 2014-01-01 MED ORDER — TECHNETIUM TC 99M SESTAMIBI - CARDIOLITE
10.0000 | Freq: Once | INTRAVENOUS | Status: AC | PRN
  Administered 2014-01-01: 10 via INTRAVENOUS

## 2014-01-01 MED ORDER — SODIUM CHLORIDE 0.9 % IV SOLN
250.0000 mL | INTRAVENOUS | Status: DC | PRN
Start: 2014-01-01 — End: 2014-01-02

## 2014-01-01 MED ORDER — LEVALBUTEROL HCL 0.63 MG/3ML IN NEBU
0.6300 mg | INHALATION_SOLUTION | Freq: Three times a day (TID) | RESPIRATORY_TRACT | Status: DC
  Administered 2014-01-01 – 2014-01-04 (×9): 0.63 mg via RESPIRATORY_TRACT
  Filled 2014-01-01 (×19): qty 3

## 2014-01-01 MED ORDER — NEBIVOLOL HCL 10 MG PO TABS
10.0000 mg | ORAL_TABLET | Freq: Every day | ORAL | Status: DC
  Administered 2014-01-02 – 2014-01-07 (×6): 10 mg via ORAL
  Filled 2014-01-01 (×6): qty 1

## 2014-01-01 MED ORDER — SODIUM CHLORIDE 0.9 % IJ SOLN
3.0000 mL | Freq: Two times a day (BID) | INTRAMUSCULAR | Status: DC
  Administered 2014-01-01: 3 mL via INTRAVENOUS

## 2014-01-01 MED ORDER — SODIUM CHLORIDE 0.9 % IV SOLN
1.0000 mL/kg/h | INTRAVENOUS | Status: DC
  Administered 2014-01-02: 1 mL/kg/h via INTRAVENOUS

## 2014-01-01 MED ORDER — DIAZEPAM 2 MG PO TABS
2.0000 mg | ORAL_TABLET | ORAL | Status: AC
  Administered 2014-01-02: 2 mg via ORAL
  Filled 2014-01-01: qty 1

## 2014-01-01 NOTE — Progress Notes (Signed)
Lexiscan CL performed 

## 2014-01-01 NOTE — Progress Notes (Signed)
Inpatient Diabetes Program Recommendations  AACE/ADA: New Consensus Statement on Inpatient Glycemic Control (2013)  Target Ranges:  Prepandial:   less than 140 mg/dL      Peak postprandial:   less than 180 mg/dL (1-2 hours)      Critically ill patients:  140 - 180 mg/dL   Reason for Visit: Hyperglycemia  Diabetes history: Type 2 DM Outpatient Diabetes medications: Lantus 30 units QHS and Humalog s/s Current orders for Inpatient glycemic control: Lantus 30 units QHS and Novolog resistant tidwc  Inpatient Diabetes Program Recommendations Insulin - Meal Coverage: May benefit from addition of Novolog 3 units tidwc if pt eats>50% meal HgbA1C: 8.9% on 12/31/2013- would benefit from tighter control at home.  Note: Will follow.  Thank you. Ailene Ardshonda Romir Klimowicz, RD, LDN, CDE Inpatient Diabetes Coordinator [phone]

## 2014-01-01 NOTE — Progress Notes (Signed)
RT spoke with patient about CPAP. At this time patient does not wish to wear CPAP tonight. RT instructed patient to notify her nurse if she changed her mind. CPAP equipment already in room. RT will continue to monitor.

## 2014-01-01 NOTE — ED Provider Notes (Signed)
I saw and evaluated the patient, reviewed the resident's note and I agree with the findings and plan.   EKG Interpretation None      73 yo female presenting with CP.  On exam, well appearing, nontoxic, heart sound normal, RRR, lungs CTAB with minimal dispersed wheezes.  Initial troponin negative.  Admit to medicine.   Clinical Impression: 1. Chest pain     Candyce ChurnJohn David Oleg Oleson III, MD 01/01/14 1014

## 2014-01-01 NOTE — Progress Notes (Signed)
[facility] PROGRESS NOTE  [name] Lane ZOX:[id number] DOB: 12/09/1940 DOA: 12/30/2013 PCP: [name] DawnOREY,JOHN, MD  Assessment/Plan:  #1 chest pain Patient currently chest pain-free. Cardiac enzymes were negative x3. Full troponin came back elevated at 0.82. Patient has been seen by cardiology.  Underwent Cardiolite stress test, which is positive and plan per cardiology is for cardiac cath in am.  #2 COPD/asthma Stable. No wheezing noted on examination. Continue Pulmicort and nebulizers for now. Follow.  #3 type 2 diabetes  hemoglobin A1c is 9.0. Continue home dose of Lantus. Sliding scale insulin.  #4 hypertension Continue Lasix.  #5 hyperlipidemia LDL is 110, total cholesterol 215, TG 256  #6 obstructive sleep apnea Place on CPAP each bedtime.  #7 prophylaxis Pepcid for GI prophylaxis. Lovenox/SCDs  for DVT prophylaxis.  Code Status: Full Family Communication: Updated patient and son at bedside. Disposition Plan: Home when medically stable.   Consultants:  Cardiology: Dr. Myrtis SerKatz 12/30/2013  Procedures:  Chest x-ray 12/30/2013    Antibiotics:  None  HPI/Subjective: Patient denies pain, went for cardioloite stress test.  Objective: Filed Vitals:   01/01/14 1333  BP: 121/64  Pulse: 92  Temp: 97.6 F (36.4 C)  Resp: 18   No intake or output data in the 24 hours ending 01/01/14 1720 Filed Weights   12/31/13 0945 01/01/14 0543  Weight: 90.946 kg (200 lb 8 oz) 83.28 kg (183 lb 9.6 oz)    Exam:  Physical Exam: Head: Normocephalic, atraumatic.  Eyes: No signs of jaundice, EOMI Nose: Mucous membranes dry.  Throat: Oropharynx nonerythematous, no exudate appreciated.  Neck: supple,No deformities, masses, or tenderness noted. Lungs: Normal respiratory effort. B/L Clear to auscultation, no crackles or wheezes.  Heart: Regular RR. S1 and S2 normal  Abdomen: BS normoactive. Soft, Nondistended, non-tender.  Extremities: No pretibial edema, no  erythema   Data Reviewed: Basic Metabolic Panel:  Recent Labs Lab 12/30/13 1727 12/30/13 1748 12/31/13 0520 01/01/14 0332  NA 141 143 139 141  K 3.7 3.5* 4.0 3.5*  CL 102 101 99 98  CO2 25  --  19 27  GLUCOSE 267* 267* 383* 140*  BUN 17 17 16  25*  CREATININE 1.20* 1.30* 0.99 1.29*  CALCIUM 9.7  --  9.3 9.8   Liver Function Tests:  Recent Labs Lab 12/31/13 0520  AST 16  ALT 13  ALKPHOS 56  BILITOT 0.3  PROT 7.0  ALBUMIN 3.4*   No results found for this basename: LIPASE, AMYLASE,  in the last 168 hours No results found for this basename: AMMONIA,  in the last 168 hours CBC:  Recent Labs Lab 12/30/13 1727 12/30/13 1748 12/31/13 0520 01/01/14 0332  WBC 9.1  --  10.8* 11.9*  NEUTROABS 6.7  --  10.1*  --   HGB 12.4 13.6 12.4 12.7  HCT 37.0 40.0 37.1 37.6  MCV 79.9  --  79.6 80.0  PLT 249  --  279 278   Cardiac Enzymes:  Recent Labs Lab 12/30/13 1728 12/31/13 0050 12/31/13 0517 12/31/13 1120  TROPONINI <0.30 <0.30 <0.30 0.82*   BNP (last 3 results)  Recent Labs  12/30/13 1728  PROBNP 320.8*   CBG:  Recent Labs Lab 12/31/13 1659 12/31/13 2054 01/01/14 0748 01/01/14 1218 01/01/14 1648  GLUCAP 168* 143* 146* 220* 148*    Recent Results (from the past 240 hour(s))  URINE CULTURE     Status: None   Collection Time    12/31/13 12:53 PM      Result Value Ref Range Status  Specimen Description URINE, RANDOM   Final   Special Requests NONE   Final   Culture  Setup Time     Final   Value: 12/31/2013 13:29     Performed at [facility]   Colony Count     Final   Value: 20,OOO COLONIES/ML     Performed at [facility]   Culture     Final   Value: Multiple bacterial morphotypes present, none predominant. Suggest appropriate recollection if clinically indicated.     Performed at [facility]   Report Status 01/01/2014 FINAL   Final     Studies: Dg Chest 2 View  12/30/2013   CLINICAL DATA:  Left chest pain  EXAM:  CHEST  2 VIEW  COMPARISON:  10/25/2011  FINDINGS: Frontal view is apical lordotic in projection. Normal heart size and vascularity. Lungs remain clear. No focal airspace process, collapse or consolidation. No edema, effusion or pneumothorax. Trachea midline. Atherosclerosis of the aorta.  IMPRESSION: No active cardiopulmonary disease.   Electronically Signed   By: Ruel Favors M.D.   On: 12/30/2013 18:23   Nm Myocar Multi W/spect W/wall Motion / Ef  01/01/2014   CLINICAL DATA:  [name] Lane is a 73 year old female who was admitted with chest pain. She was not able to achieve target HR while walking so she received Lexiscan  EXAM: MYOCARDIAL IMAGING WITH SPECT (REST AND PHARMACOLOGIC-STRESS)  GATED LEFT VENTRICULAR WALL MOTION STUDY  LEFT VENTRICULAR EJECTION FRACTION  TECHNIQUE: Standard myocardial SPECT imaging was performed after resting intravenous injection of 10 mCi Tc-73m sestamibi. Subsequently, intravenous infusion of Lexiscan was performed under the supervision of the Cardiology staff. At peak effect of the drug, 30 mCi Tc-35m sestamibi was injected intravenously and standard myocardial SPECT imaging was performed. Quantitative gated imaging was also performed to evaluate left ventricular wall motion, and estimate left ventricular ejection fraction.  COMPARISON:  None.  FINDINGS: The patient had some nonspecific ST changes at baseline. This did not significantly worsened with the Lexiscan .  The raw data images reveal no motion artifact. The stress images reveal a medium-sized mild defect in the distal anterior lateral wall. Remaining walls are fairly normal. The resting images reveal improved uptake in the distal anterior and lateral wall. There is evidence of mid and distal anteriolateral ischemia. The SDS equals 9.  The quantitative gated SPECT images reveal an end-diastolic volume of 58 mm. End-systolic volume is 21 mL. The ejection fraction is 64%. In looking at the cine loop pictures, I actually  think that the left ventricle systolic function is greater than 64% calculated by the computer.  IMPRESSION: This is interpreted as an abnormal stress Myoview study. There is evidence of ischemia in the distal anterior lateral wall. On the raw data images I do not see a breast shadow to account for this anterolateral attenuation. Her left ventricular systolic function is well-preserved. Ejection fraction is calculated to be 64% but may in fact be greater than 64%.   Electronically Signed   By: Kristeen Miss   On: 01/01/2014 16:41    Scheduled Meds: . Melene Muller ON 01/02/2014] aspirin  81 mg Oral Pre-Cath  . aspirin EC  325 mg Oral Daily  . budesonide  0.5 mg Nebulization BID  . [START ON 01/02/2014] diazepam  2 mg Oral On Call  . enoxaparin (LOVENOX) injection  40 mg Subcutaneous Q24H  . famotidine  20 mg Oral BID  . FLUoxetine  20 mg Oral Daily  . furosemide  40-60 mg Oral BID  . insulin aspart  0-20 Units Subcutaneous TID WC  . insulin glargine  30 Units Subcutaneous QHS  . levalbuterol  0.63 mg Nebulization TID  . loratadine  10 mg Oral Daily  . LORazepam  1 mg Oral QHS  . [START ON 01/02/2014] nebivolol  10 mg Oral Daily  . potassium chloride  10 mEq Oral Daily  . predniSONE  5 mg Oral Daily  . regadenoson      . simvastatin  20 mg Oral q1800  . sodium chloride  3 mL Intravenous Q12H   Continuous Infusions: . [START ON 01/02/2014] sodium chloride      Principal Problem:   Chest pain Active Problems:   DIABETES MELLITUS, TYPE II   HYPERCHOLESTEROLEMIA  IIA   Anxiety state, unspecified   HYPERTENSION   CAD - remote LAD and RCA stenting, last cath 12/11- 20% LAD, 40% RCA   Asthma exacerbation   C O P D   OBSTRUCTIVE SLEEP APNEA- non compliant with C-pap   Chest pain with moderate risk of acute coronary syndrome   Dyspnea    Time spent: 35 MINS    [facility] MD [facility] Pager [phone]. If 7PM-7AM, please contact night-coverage at [email or web address], password  [facility] 01/01/2014, 5:20 PM  LOS: 2 days

## 2014-01-01 NOTE — Progress Notes (Addendum)
Patient Name: [name] Lane Date of Encounter: 01/01/2014  Principal Problem:   Chest pain Active Problems:   DIABETES MELLITUS, TYPE II   HYPERCHOLESTEROLEMIA  IIA   Anxiety state, unspecified   HYPERTENSION   CAD - remote LAD and RCA stenting, last cath 12/11- 20% LAD, 40% RCA   Asthma exacerbation   C O P D   OBSTRUCTIVE SLEEP APNEA- non compliant with C-pap   Chest pain with moderate risk of acute coronary syndrome   Dyspnea    SUBJECTIVE: No chest pain, no SOB  OBJECTIVE Filed Vitals:   12/31/13 2002 12/31/13 2045 01/01/14 0543 01/01/14 1032  BP:  148/57 131/69 121/35  Pulse:  84 88 76  Temp:  98.1 F (36.7 C) 98.1 F (36.7 C)   TempSrc:  Oral Oral   Resp:  16 18   Weight:   183 lb 9.6 oz (83.28 kg)   SpO2: 98% 94% 97%    No intake or output data in the 24 hours ending 01/01/14 1034 Filed Weights   12/31/13 0945 01/01/14 0543  Weight: 200 lb 8 oz (90.946 kg) 183 lb 9.6 oz (83.28 kg)    PHYSICAL EXAM General: Well developed, well nourished, female in no acute distress. Head: Normocephalic, atraumatic.  Neck: Supple without bruits, JVD not elevated. Lungs:  Resp regular and unlabored, few rales bases. Heart: RRR, S1, S2, no S3, S4, or murmur; no rub. Abdomen: Soft, non-tender, non-distended, BS + x 4.  Extremities: No clubbing, cyanosis, no edema.  Neuro: Alert and oriented X 3. Moves all extremities spontaneously. Psych: Normal affect.  LABS: CBC: Recent Labs  12/30/13 1727  12/31/13 0520 01/01/14 0332  WBC 9.1  --  10.8* 11.9*  NEUTROABS 6.7  --  10.1*  --   HGB 12.4  < > 12.4 12.7  HCT 37.0  < > 37.1 37.6  MCV 79.9  --  79.6 80.0  PLT 249  --  279 278  < > = values in this interval not displayed.  Basic Metabolic Panel: Recent Labs  12/31/13 0520 01/01/14 0332  NA 139 141  K 4.0 3.5*  CL 99 98  CO2 19 27  GLUCOSE 383* 140*  BUN 16 25*  CREATININE 0.99 1.29*  CALCIUM 9.3 9.8   Liver Function Tests: Recent Labs   12/31/13 0520  AST 16  ALT 13  ALKPHOS 56  BILITOT 0.3  PROT 7.0  ALBUMIN 3.4*   Cardiac Enzymes: Recent Labs  12/31/13 0050 12/31/13 0517 12/31/13 1120  TROPONINI <0.30 <0.30 0.82*    Recent Labs  12/30/13 1747 12/30/13 2024  TROPIPOC 0.01 0.00   BNP: Pro B Natriuretic peptide (BNP)  Date/Time Value Ref Range Status  12/30/2013  5:28 PM 320.8* 0 - 125 pg/mL Final  11/02/2010  1:00 PM 100.1* 0.0 - 100.0 pg/mL Final   Hemoglobin A1C: Recent Labs  12/31/13 0500  HGBA1C 8.9*   Fasting Lipid Panel: Recent Labs  01/01/14 0332  CHOL 215*  HDL 54  LDLCALC 110*  TRIG 256*  CHOLHDL 4.0   Thyroid Function Tests: Recent Labs  12/31/13 0520  TSH 0.255*   TELE:   Seen in Nuc med, SR     ECG: SR, no new changes   Radiology/Studies: Dg Chest 2 View  12/30/2013   CLINICAL DATA:  Left chest pain  EXAM: CHEST  2 VIEW  COMPARISON:  10/25/2011  FINDINGS: Frontal view is apical lordotic in projection. Normal heart size and vascularity. Lungs  remain clear. No focal airspace process, collapse or consolidation. No edema, effusion or pneumothorax. Trachea midline. Atherosclerosis of the aorta.  IMPRESSION: No active cardiopulmonary disease.   Electronically Signed   By: Ruel Favors M.D.   On: 12/30/2013 18:23     Current Medications:  . aspirin  324 mg Oral Once  . aspirin EC  325 mg Oral Daily  . budesonide  0.5 mg Nebulization BID  . enoxaparin (LOVENOX) injection  40 mg Subcutaneous Q24H  . famotidine  20 mg Oral BID  . FLUoxetine  20 mg Oral Daily  . furosemide  40-60 mg Oral BID  . insulin aspart  0-20 Units Subcutaneous TID WC  . insulin glargine  30 Units Subcutaneous QHS  . levalbuterol  0.63 mg Nebulization TID  . loratadine  10 mg Oral Daily  . LORazepam  1 mg Oral QHS  . nebivolol  5 mg Oral Daily  . potassium chloride  10 mEq Oral Daily  . predniSONE  5 mg Oral Daily  . regadenoson      . simvastatin  20 mg Oral q1800      ASSESSMENT AND  PLAN: Principal Problem:   Chest pain - atypical and symptoms have resolved. Spoke w/ TK, elevated troponin was anomaly,  But pt has hx CAD. OK to do Lexiscan today, cath if high risk.  Otherwise, per IM Active Problems:   DIABETES MELLITUS, TYPE II   HYPERCHOLESTEROLEMIA  IIA   Anxiety state, unspecified   HYPERTENSION   CAD - remote LAD and RCA stenting, last cath 12/11- 20% LAD, 40% RCA   Asthma exacerbation   C O P D   OBSTRUCTIVE SLEEP APNEA- non compliant with C-pap   Chest pain with moderate risk of acute coronary syndrome   Dyspnea   Signed, Theodore Demark , PA-C 10:34 AM 01/01/2014    Patient seen and examined. Agree with assessment and plan. No recurrent discomfort. ECG yesterday with NSST changes. Nuclear study to assess for significant ischemia. Will empirically increase cardioselective beta blockade. If significant ischemia consider cath.   Lennette Bihari, MD, [facility] 01/01/2014 2:32 PM

## 2014-01-01 NOTE — Progress Notes (Signed)
Stress test positive will proceed with cardiac cath in AM. Pt agreeable.

## 2014-01-02 ENCOUNTER — Ambulatory Visit: Payer: Medicare Other

## 2014-01-02 ENCOUNTER — Encounter (HOSPITAL_COMMUNITY)
Admission: EM | Disposition: A | Discharge status: Home / Self Care | Payer: Medicare Other | Source: Home / Self Care | Attending: Internal Medicine

## 2014-01-02 DIAGNOSIS — J449 Chronic obstructive pulmonary disease, unspecified: Secondary | ICD-10-CM

## 2014-01-02 DIAGNOSIS — I359 Nonrheumatic aortic valve disorder, unspecified: Secondary | ICD-10-CM

## 2014-01-02 DIAGNOSIS — I214 Non-ST elevation (NSTEMI) myocardial infarction: Principal | ICD-10-CM

## 2014-01-02 DIAGNOSIS — F411 Generalized anxiety disorder: Secondary | ICD-10-CM

## 2014-01-02 DIAGNOSIS — I2510 Atherosclerotic heart disease of native coronary artery without angina pectoris: Secondary | ICD-10-CM

## 2014-01-02 DIAGNOSIS — R9439 Abnormal result of other cardiovascular function study: Secondary | ICD-10-CM | POA: Diagnosis not present

## 2014-01-02 HISTORY — PX: TRANSTHORACIC ECHOCARDIOGRAM: SHX275

## 2014-01-02 HISTORY — PX: LEFT HEART CATHETERIZATION WITH CORONARY ANGIOGRAM: SHX5451

## 2014-01-02 LAB — BASIC METABOLIC PANEL
BUN: 35 mg/dL — AB (ref 6–23)
BUN: 31 mg/dL — AB (ref 6–23)
CALCIUM: 9.5 mg/dL (ref 8.4–10.5)
CO2: 24 meq/L (ref 19–32)
CO2: 24 mEq/L (ref 19–32)
CREATININE: 1.61 mg/dL — AB (ref 0.50–1.10)
Calcium: 9.3 mg/dL (ref 8.4–10.5)
Chloride: 97 mEq/L (ref 96–112)
Chloride: 100 mEq/L (ref 96–112)
Creatinine, Ser: 1.42 mg/dL — ABNORMAL HIGH (ref 0.50–1.10)
GFR calc Af Amer: 35 mL/min — ABNORMAL LOW (ref >90)
GFR calc non Af Amer: 30 mL/min — ABNORMAL LOW (ref >90)
GFR calc non Af Amer: 35 mL/min — ABNORMAL LOW (ref >90)
GFR, EST AFRICAN AMERICAN: 41 mL/min — AB (ref >90)
Glucose, Bld: 150 mg/dL — ABNORMAL HIGH (ref 70–99)
Glucose, Bld: 127 mg/dL — ABNORMAL HIGH (ref 70–99)
POTASSIUM: 4.2 meq/L (ref 3.7–5.3)
Potassium: 4.7 mEq/L (ref 3.7–5.3)
Sodium: 138 mEq/L (ref 137–147)
Sodium: 140 mEq/L (ref 137–147)

## 2014-01-02 LAB — POCT ACTIVATED CLOTTING TIME: Activated Clotting Time: 287 seconds

## 2014-01-02 LAB — GLUCOSE, CAPILLARY
GLUCOSE-CAPILLARY: 135 mg/dL — AB (ref 70–99)
Glucose-Capillary: 169 mg/dL — ABNORMAL HIGH (ref 70–99)
Glucose-Capillary: 132 mg/dL — ABNORMAL HIGH (ref 70–99)
Glucose-Capillary: 116 mg/dL — ABNORMAL HIGH (ref 70–99)

## 2014-01-02 LAB — CBC
HCT: 38.4 % (ref 36.0–46.0)
HEMOGLOBIN: 13 g/dL (ref 12.0–15.0)
MCH: 27.3 pg (ref 26.0–34.0)
MCHC: 33.9 g/dL (ref 30.0–36.0)
MCV: 80.5 fL (ref 78.0–100.0)
Platelets: 302 10*3/uL (ref 150–400)
RBC: 4.77 MIL/uL (ref 3.87–5.11)
RDW: 15.1 % (ref 11.5–15.5)
WBC: 11.5 10*3/uL — ABNORMAL HIGH (ref 4.0–10.5)

## 2014-01-02 LAB — PROTIME-INR
INR: 1 (ref 0.00–1.49)
Prothrombin Time: 13 seconds (ref 11.6–15.2)

## 2014-01-02 SURGERY — LEFT HEART CATHETERIZATION WITH CORONARY ANGIOGRAM
Anesthesia: LOCAL

## 2014-01-02 MED ORDER — SODIUM CHLORIDE 0.9 % IJ SOLN
3.0000 mL | Freq: Two times a day (BID) | INTRAMUSCULAR | Status: DC
  Administered 2014-01-02 – 2014-01-04 (×5): 3 mL via INTRAVENOUS

## 2014-01-02 MED ORDER — INSULIN GLARGINE 100 UNIT/ML ~~LOC~~ SOLN
40.0000 [IU] | Freq: Every day | SUBCUTANEOUS | Status: DC
  Administered 2014-01-02 – 2014-01-03 (×2): 40 [IU] via SUBCUTANEOUS
  Filled 2014-01-02 (×4): qty 0.4

## 2014-01-02 MED ORDER — TICAGRELOR 90 MG PO TABS
ORAL_TABLET | ORAL | Status: AC
  Filled 2014-01-02: qty 1

## 2014-01-02 MED ORDER — HEPARIN SODIUM (PORCINE) 1000 UNIT/ML IJ SOLN
INTRAMUSCULAR | Status: AC
  Filled 2014-01-02: qty 1

## 2014-01-02 MED ORDER — TICAGRELOR 90 MG PO TABS: 90.0000 mg | ORAL_TABLET | Freq: Two times a day (BID) | ORAL | Status: DC

## 2014-01-02 MED ORDER — SODIUM CHLORIDE 0.9 % IV SOLN: 250.0000 mL | INTRAVENOUS | Status: DC | PRN

## 2014-01-02 MED ORDER — MORPHINE SULFATE 2 MG/ML IJ SOLN: 2.0000 mg | INTRAMUSCULAR | Status: DC | PRN

## 2014-01-02 MED ORDER — SODIUM CHLORIDE 0.9 % IJ SOLN: 3.0000 mL | INTRAMUSCULAR | Status: DC | PRN

## 2014-01-02 MED ORDER — FUROSEMIDE 40 MG PO TABS
40.0000 mg | ORAL_TABLET | Freq: Two times a day (BID) | ORAL | Status: DC
  Administered 2014-01-02 – 2014-01-05 (×6): 40 mg via ORAL
  Filled 2014-01-02 (×8): qty 1

## 2014-01-02 MED ORDER — FENTANYL CITRATE 0.05 MG/ML IJ SOLN
INTRAMUSCULAR | Status: AC
  Filled 2014-01-02: qty 2

## 2014-01-02 MED ORDER — HEPARIN (PORCINE) IN NACL 2-0.9 UNIT/ML-% IJ SOLN
INTRAMUSCULAR | Status: AC
  Filled 2014-01-02: qty 1000

## 2014-01-02 MED ORDER — OMALIZUMAB 150 MG ~~LOC~~ SOLR
375.0000 mg | Freq: Once | SUBCUTANEOUS | Status: AC
  Administered 2014-01-02: 375 mg via SUBCUTANEOUS

## 2014-01-02 MED ORDER — LIDOCAINE HCL (PF) 1 % IJ SOLN
INTRAMUSCULAR | Status: AC
  Filled 2014-01-02: qty 30

## 2014-01-02 MED ORDER — SODIUM CHLORIDE 0.9 % IV SOLN
INTRAVENOUS | Status: DC
  Administered 2014-01-02 (×2): via INTRAVENOUS

## 2014-01-02 MED ORDER — VERAPAMIL HCL 2.5 MG/ML IV SOLN
INTRAVENOUS | Status: AC
  Filled 2014-01-02: qty 2

## 2014-01-02 MED ORDER — NITROGLYCERIN 0.2 MG/ML ON CALL CATH LAB
INTRAVENOUS | Status: AC
  Filled 2014-01-02: qty 1

## 2014-01-02 MED ORDER — TICAGRELOR 90 MG PO TABS
180.0000 mg | ORAL_TABLET | Freq: Once | ORAL | Status: DC
  Filled 2014-01-02: qty 2

## 2014-01-02 MED ORDER — ASPIRIN 81 MG PO CHEW
81.0000 mg | CHEWABLE_TABLET | Freq: Every day | ORAL | Status: DC
  Administered 2014-01-03 – 2014-01-07 (×4): 81 mg via ORAL
  Filled 2014-01-02 (×5): qty 1

## 2014-01-02 MED ORDER — TICAGRELOR 90 MG PO TABS
90.0000 mg | ORAL_TABLET | Freq: Two times a day (BID) | ORAL | Status: DC
  Administered 2014-01-03 – 2014-01-07 (×9): 90 mg via ORAL
  Filled 2014-01-02 (×15): qty 1

## 2014-01-02 MED ORDER — MIDAZOLAM HCL 2 MG/2ML IJ SOLN
INTRAMUSCULAR | Status: AC
  Filled 2014-01-02: qty 2

## 2014-01-02 MED ORDER — SODIUM CHLORIDE 0.9 % IV SOLN
1.0000 mL/kg/h | INTRAVENOUS | Status: AC
Start: 2014-01-02 — End: 2014-01-03

## 2014-01-02 NOTE — Brief Op Note (Signed)
  Brief Cardiac Catheterization Note  12/30/2013 - 01/02/2014  5:01 PM  PATIENT:  [name] Lane  73 y.o. female with known history of CAD - PCI to the LAD and RCA in 2003. She presented with signs and symptoms of resting chest pain that was somewhat atypical in nature. Initial cardiac enzymes are negative. Followup troponin did return +0.8. She also had a stress test performed which showed apical anterolateral ischemia.  He is therefore referred for invasive evaluation cardiac catheterization. Due to concern for reduced renal function,  left ventriculography was not performed.  PRE-OPERATIVE DIAGNOSIS:  abnormal nuclear stress test; non-STEMI  POST-OPERATIVE DIAGNOSIS:    Severe 2 vessel CAD with 95% proximal D1 lesion -os consistent with the stress test, and therefore the culprit lesion. Also noted is a roughly 80% napkin ring lesion in the proximal RCA just prior to the stent. Lastly there is a 99% ostial stenosis of a small RPL 3.  Successful PCI of proximal D1 with a Promus Premier DES 2.25 mm x 12 mm (2.4 mm postdilated)  Aortic pressure: 1:30/58/81 mmHg; LV pressures 121/7/9 mmHg . PROCEDURE:  Procedure(s): LEFT HEART CATHETERIZATION WITH CORONARY ANGIOGRAM (N/A)  SURGEON:  Surgeon(s) and Role:    * Leonie Man, MD - Primary  ANESTHESIA:   local and IV sedation; 2 mL subcutaneous lidocaine. 2 mg Versed, 50 mcg fentanyl.  EBL:    less than 10 mL  DIAGNOSTIC CARDIAC CATHETERIZATION/ANGIOGRAPHY: 6 French right radial access --> 5 [place] TIG 4.0 catheter advanced and exchanged over longer safety J-wire into the descending aorta and used to engage the left and right coronary artery for selective coronary angiography.  Catheter then advanced over the wire into the left ventricle for measurement of a left ventricular hemodynamics.   PCI: After reviewing the initial data the likely culprit lesion was the diagonal lesion. Plans are made to perform PCI.  IV heparin additional 2500 units  (total 7000 units) given. Oral Brilinta 100 mg  XB LAD 3.5 guide; BMW wire. Predilation balloon Sprinter Legend 2.0 mm x 12 mm -- 9 Atm x 30 sec; stent: Promus Premier DES to 2.25 mm x 12 mm --> deployed 12 Atm x 30 sec; postdilated with stent balloon:16 Atm x 30 sec  No dissection or perforation noted on post PCI angiography.  MEDICATIONS USED:   Radial Cocktail: 5 mg Verapamil, 400 mcg NTG, 2 ml 2% Lidocaine: 10 ml NS  IV heparin 7000 units, Brilinta 100 mg,   TR Band:  13 mL; 1650 hours  DICTATION: .Note written in EPIC  PLAN OF CARE: Admit to inpatient ; continue on dual antiplatelet therapy. Plan for staged PCI to proximal RCA on Monday.  PATIENT DISPOSITION:  PACU - hemodynamically stable.    Leonie Man, M.D., M.S. Interventional Cardiologist  [place] Pager # [phone] 01/02/2014

## 2014-01-02 NOTE — Interval H&P Note (Signed)
History and Physical Interval Note:  01/02/2014 3:41 PM  73 y/o woman with long-standing CAD-PCI presented with SSX of Unstable Angina & ruled in for MI.  Myoview read as + for Ischemia.  [name] Lane  has presented today for surgery, with the diagnosis of abnormal nuclear stress test  The various methods of treatment have been discussed with the patient and family. After consideration of risks, benefits and other options for treatment, the patient has consented to  Procedure(s): LEFT HEART CATHETERIZATION WITH CORONARY ANGIOGRAM (N/A) +/- PCI as a surgical intervention .  The patient's history has been reviewed, patient examined, no change in status, stable for surgery.  I have reviewed the patient's chart and labs.  Questions were answered to the patient's satisfaction.     HARDING,DAVID W  Cath Lab Visit (complete for each Cath Lab visit)  Clinical Evaluation Leading to the Procedure:   ACS: yes  Non-ACS:    Anginal Classification: CCS IV  Anti-ischemic medical therapy: Minimal Therapy (1 class of medications)  Non-Invasive Test Results: Intermediate-risk stress test findings: cardiac mortality 1-3%/year  Prior CABG: No previous CABG

## 2014-01-02 NOTE — Progress Notes (Signed)
TR BAND REMOVAL  LOCATION:    right radial  DEFLATED PER PROTOCOL:    yes  TIME BAND OFF / DRESSING APPLIED:   2120 SITE UPON ARRIVAL:    Level 0  SITE AFTER BAND REMOVAL:    Level 0  REVERSE ALLEN'S TEST:     positive  CIRCULATION SENSATION AND MOVEMENT:    Within Normal Limits   yes  COMMENTS:   Reviewed post radial cath instruction sheet with patient, denies questions. Tolerating use of bsc well w/ assist due to many cords, voiding well since lasix given.

## 2014-01-02 NOTE — Progress Notes (Signed)
Echocardiogram 2D Echocardiogram has been performed.  [name] Lane, [name] Lane M 01/02/2014, 11:48 AM

## 2014-01-02 NOTE — Progress Notes (Signed)
Rt radial drsg lightly saturated with serosang drainage.  Removed and redressed w/ 2x2/tegaderm.

## 2014-01-02 NOTE — Progress Notes (Signed)
Placed patient on CPAP at 10cmH20 with SP02 level at this time 100%

## 2014-01-02 NOTE — Progress Notes (Signed)
[facility] PROGRESS NOTE  TRISTYN PHARRIS XBJ:[id number] DOB: [birth year] DOA: 12/30/2013 PCP: Lupita Dawn, MD  Assessment/Plan:  #1 Chest pain Patient currently chest pain-free. Cardiac enzymes were negative x3. Full troponin came back elevated at 0.82. Patient has been seen by cardiology.  Underwent Cardiolite stress test, which is positive and plan per cardiology is for cardiac cath today.  #2 COPD/asthma Stable. No wheezing noted on examination. Continue Pulmicort and nebulizers for now. Follow.  #3 Type 2 diabetes  hemoglobin A1c is 9.0. Continue home dose of Lantus. Sliding scale insulin.  #4 Hypertension Continue Lasix.  #5 Hyperlipidemia LDL is 110, total cholesterol 215, TG 256  #6 Obstructive sleep apnea Place on CPAP each bedtime.  #7 AKI Secondary to lasix, was started on IV fluids, creatinine is 1.42  #8 Prophylaxis Pepcid for GI prophylaxis. Lovenox/SCDs  for DVT prophylaxis.  Code Status: Full Family Communication: Updated patient and son at bedside. Disposition Plan: Home when medically stable.   Consultants:  Cardiology: Dr. Myrtis Ser 12/30/2013  Procedures:  Chest x-ray 12/30/2013    Antibiotics:  None  HPI/Subjective: 73 y.o. female history of CAD status post stenting, COPD/asthma, OSA noncompliant, hyperlipidemia, diabetes mellitus presents to the ER with complaints of chest pain. Patient started experiencing chest pain in the afternoon. Pain was left-sided stabbing in nature nonradiating present even at rest. Patient's chest pain improved with sublingual nitroglycerin and presently chest pain-free. Cardiac markers have been negative EKG shows nonspecific findings. On-call cardiologist was consulted and at this time patient has been admitted for further management. Patient denies any nausea vomiting abdominal pain or diarrhea. Patient has wheezing on examination which patient states is chronic.   Patient seen examined, going for cardiac cath  today, creatinine is mildly elevated, which is improved.  Objective: Filed Vitals:   01/02/14 1309  BP: 151/69  Pulse: 77  Temp: 97.9 F (36.6 C)  Resp: 18   No intake or output data in the 24 hours ending 01/02/14 1521 Filed Weights   12/31/13 0945 01/01/14 0543 01/02/14 0423  Weight: 90.946 kg (200 lb 8 oz) 83.28 kg (183 lb 9.6 oz) 88.361 kg (194 lb 12.8 oz)    Exam:        Physical Exam: Head: Normocephalic, atraumatic.  Eyes: No signs of jaundice, EOMI Nose: Mucous membranes dry.  Throat: Oropharynx nonerythematous, no exudate appreciated.  Neck: supple,No deformities, masses, or tenderness noted. Lungs: Normal respiratory effort. B/L Clear to auscultation, no crackles or wheezes.  Heart: Regular RR. S1 and S2 normal  Abdomen: BS normoactive. Soft, Nondistended, non-tender.  Extremities: No pretibial edema, no erythema   Data Reviewed: Basic Metabolic Panel:  Recent Labs Lab 12/30/13 1727 12/30/13 1748 12/31/13 0520 01/01/14 0332 01/02/14 0518 01/02/14 1135  NA 141 143 139 141 138 140  K 3.7 3.5* 4.0 3.5* 4.7 4.2  CL 102 101 99 98 97 100  CO2 25  --  19 27 24 24   GLUCOSE 267* 267* 383* 140* 150* 127*  BUN 17 17 16  25* 35* 31*  CREATININE 1.20* 1.30* 0.99 1.29* 1.61* 1.42*  CALCIUM 9.7  --  9.3 9.8 9.5 9.3   Liver Function Tests:  Recent Labs Lab 12/31/13 0520  AST 16  ALT 13  ALKPHOS 56  BILITOT 0.3  PROT 7.0  ALBUMIN 3.4*   No results found for this basename: LIPASE, AMYLASE,  in the last 168 hours No results found for this basename: AMMONIA,  in the last 168 hours CBC:  Recent Labs Lab 12/30/13  1727 12/30/13 1748 12/31/13 0520 01/01/14 0332 01/02/14 0518  WBC 9.1  --  10.8* 11.9* 11.5*  NEUTROABS 6.7  --  10.1*  --   --   HGB 12.4 13.6 12.4 12.7 13.0  HCT 37.0 40.0 37.1 37.6 38.4  MCV 79.9  --  79.6 80.0 80.5  PLT 249  --  279 278 302   Cardiac Enzymes:  Recent Labs Lab 12/30/13 1728 12/31/13 0050 12/31/13 0517  12/31/13 1120  TROPONINI <0.30 <0.30 <0.30 0.82*   BNP (last 3 results)  Recent Labs  12/30/13 1728  PROBNP 320.8*   CBG:  Recent Labs Lab 01/01/14 1218 01/01/14 1648 01/01/14 2029 01/02/14 0800 01/02/14 1149  GLUCAP 220* 148* 157* 169* 132*    Recent Results (from the past 240 hour(s))  URINE CULTURE     Status: None   Collection Time    12/31/13 12:53 PM      Result Value Ref Range Status   Specimen Description URINE, RANDOM   Final   Special Requests NONE   Final   Culture  Setup Time     Final   Value: 12/31/2013 13:29     Performed at [facility]   Colony Count     Final   Value: 20,OOO COLONIES/ML     Performed at [facility]   Culture     Final   Value: Multiple bacterial morphotypes present, none predominant. Suggest appropriate recollection if clinically indicated.     Performed at [facility]   Report Status 01/01/2014 FINAL   Final     Studies: Nm Myocar Multi W/spect W/wall Motion / Ef  01/01/2014   CLINICAL DATA:  Vena RuaCarolyn Layne is a 73 year old female who was admitted with chest pain. She was not able to achieve target HR while walking so she received Lexiscan  EXAM: MYOCARDIAL IMAGING WITH SPECT (REST AND PHARMACOLOGIC-STRESS)  GATED LEFT VENTRICULAR WALL MOTION STUDY  LEFT VENTRICULAR EJECTION FRACTION  TECHNIQUE: Standard myocardial SPECT imaging was performed after resting intravenous injection of 10 mCi Tc-571m sestamibi. Subsequently, intravenous infusion of Lexiscan was performed under the supervision of the Cardiology staff. At peak effect of the drug, 30 mCi Tc-1771m sestamibi was injected intravenously and standard myocardial SPECT imaging was performed. Quantitative gated imaging was also performed to evaluate left ventricular wall motion, and estimate left ventricular ejection fraction.  COMPARISON:  None.  FINDINGS: The patient had some nonspecific ST changes at baseline. This did not significantly worsened with the Lexiscan  .  The raw data images reveal no motion artifact. The stress images reveal a medium-sized mild defect in the distal anterior lateral wall. Remaining walls are fairly normal. The resting images reveal improved uptake in the distal anterior and lateral wall. There is evidence of mid and distal anteriolateral ischemia. The SDS equals 9.  The quantitative gated SPECT images reveal an end-diastolic volume of 58 mm. End-systolic volume is 21 mL. The ejection fraction is 64%. In looking at the cine loop pictures, I actually think that the left ventricle systolic function is greater than 64% calculated by the computer.  IMPRESSION: This is interpreted as an abnormal stress Myoview study. There is evidence of ischemia in the distal anterior lateral wall. On the raw data images I do not see a breast shadow to account for this anterolateral attenuation. Her left ventricular systolic function is well-preserved. Ejection fraction is calculated to be 64% but may in fact be greater than 64%.   Electronically Signed  By: Kristeen Miss   On: 01/01/2014 16:41    Scheduled Meds: . Melene Muller ON 01/03/2014] aspirin EC  325 mg Oral Daily  . budesonide  0.5 mg Nebulization BID  . enoxaparin (LOVENOX) injection  40 mg Subcutaneous Q24H  . famotidine  20 mg Oral BID  . FLUoxetine  20 mg Oral Daily  . furosemide  40-60 mg Oral BID  . insulin aspart  0-20 Units Subcutaneous TID WC  . insulin glargine  40 Units Subcutaneous QHS  . levalbuterol  0.63 mg Nebulization TID  . loratadine  10 mg Oral Daily  . LORazepam  1 mg Oral QHS  . nebivolol  10 mg Oral Daily  . potassium chloride  10 mEq Oral Daily  . predniSONE  5 mg Oral Daily  . simvastatin  20 mg Oral q1800  . sodium chloride  3 mL Intravenous Q12H   Continuous Infusions: . sodium chloride 100 mL/hr at 01/02/14 1514    Principal Problem:   NSTEMI (non-ST elevated myocardial infarction) Active Problems:   DIABETES MELLITUS, TYPE II   HYPERCHOLESTEROLEMIA  IIA    Anxiety state, unspecified   HYPERTENSION   CAD - remote LAD and RCA stenting, last cath 12/11- 20% LAD, 40% RCA   Asthma exacerbation   C O P D   OBSTRUCTIVE SLEEP APNEA- non compliant with C-pap   Chest pain with moderate risk of acute coronary syndrome   Dyspnea   Chest pain   Abnormal nuclear stress test    Time spent: 25 min    [facility] S MD [facility] Pager [phone]. If 7PM-7AM, please contact night-coverage at [email or web address], password [facility] 01/02/2014, 3:21 PM  LOS: 3 days

## 2014-01-02 NOTE — Progress Notes (Addendum)
DAILY PROGRESS NOTE  Subjective:  No events overnight. Chest pain free today. Creatinine is climbing. Now on NS at 100 cc/hr.  Objective:  Temp:  [97.6 F (36.4 C)-98.8 F (37.1 C)] 98.1 F (36.7 C) (03/06 0416) Pulse Rate:  [76-93] 87 (03/06 0416) Resp:  [16-18] 18 (03/06 0416) BP: (104-149)/(35-95) 135/62 mmHg (03/06 0416) SpO2:  [93 %-98 %] 97 % (03/06 0416) Weight:  [194 lb 12.8 oz (88.361 kg)] 194 lb 12.8 oz (88.361 kg) (03/06 0423) Weight change: -5 lb 11.2 oz (-2.586 kg)  Intake/Output from previous day:    Intake/Output from this shift:    Medications: Current Facility-Administered Medications  Medication Dose Route Frequency Provider Last Rate Last Dose  . 0.9 %  sodium chloride infusion  250 mL Intravenous PRN Cecilie Kicks, NP      . 0.9 %  sodium chloride infusion   Intravenous Continuous Leonie Man, MD 100 mL/hr at 01/02/14 [phone]    . [START ON 01/03/2014] aspirin EC tablet 325 mg  325 mg Oral Daily Oswald Hillock, MD      . budesonide (PULMICORT) nebulizer solution 0.5 mg  0.5 mg Nebulization BID Rise Patience, MD   0.5 mg at 01/01/14 2008  . diazepam (VALIUM) tablet 2 mg  2 mg Oral On Call Cecilie Kicks, NP      . enoxaparin (LOVENOX) injection 40 mg  40 mg Subcutaneous Q24H Rise Patience, MD      . famotidine (PEPCID) tablet 20 mg  20 mg Oral BID Eugenie Filler, MD   20 mg at 01/01/14 2127  . FLUoxetine (PROZAC) tablet 20 mg  20 mg Oral Daily Rise Patience, MD   20 mg at 01/01/14 1243  . furosemide (LASIX) tablet 40-60 mg  40-60 mg Oral BID Rise Patience, MD   40 mg at 01/01/14 1810  . gi cocktail (Maalox,Lidocaine,Donnatal)  30 mL Oral TID PRN Eugenie Filler, MD      . HYDROcodone-acetaminophen (NORCO/VICODIN) 5-325 MG per tablet 1 tablet  1 tablet Oral Q4H PRN Oswald Hillock, MD   1 tablet at 01/01/14 1640  . insulin aspart (novoLOG) injection 0-20 Units  0-20 Units Subcutaneous TID WC Eugenie Filler, MD   3 Units at 01/01/14  1810  . insulin glargine (LANTUS) injection 30 Units  30 Units Subcutaneous QHS Rise Patience, MD   30 Units at 01/01/14 2128  . levalbuterol (XOPENEX) nebulizer solution 0.63 mg  0.63 mg Nebulization Q6H PRN Rise Patience, MD      . levalbuterol Penne Lash) nebulizer solution 0.63 mg  0.63 mg Nebulization TID Eugenie Filler, MD   0.63 mg at 01/01/14 2008  . loratadine (CLARITIN) tablet 10 mg  10 mg Oral Daily Rise Patience, MD   10 mg at 01/01/14 1243  . LORazepam (ATIVAN) tablet 1 mg  1 mg Oral QHS Rise Patience, MD   1 mg at 01/01/14 2127  . nebivolol (BYSTOLIC) tablet 10 mg  10 mg Oral Daily Troy Sine, MD      . potassium chloride (K-DUR) CR tablet 10 mEq  10 mEq Oral Daily Rise Patience, MD   10 mEq at 01/01/14 1242  . predniSONE (DELTASONE) tablet 5 mg  5 mg Oral Daily Rise Patience, MD   5 mg at 01/01/14 1242  . simvastatin (ZOCOR) tablet 20 mg  20 mg Oral q1800 Rise Patience, MD   20 mg at 01/01/14  1809  . sodium chloride 0.9 % injection 3 mL  3 mL Intravenous Q12H Cecilie Kicks, NP   3 mL at 01/01/14 2129  . sodium chloride 0.9 % injection 3 mL  3 mL Intravenous PRN Cecilie Kicks, NP        Physical Exam: General appearance: alert and no distress Lungs: clear to auscultation bilaterally Heart: regular rate and rhythm, S1, S2 normal, no murmur, click, rub or gallop Abdomen: soft, non-tender; bowel sounds normal; no masses,  no organomegaly Extremities: extremities normal, atraumatic, no cyanosis or edema  Lab Results: Results for orders placed during the hospital encounter of 12/30/13 (from the past 48 hour(s))  TROPONIN I     Status: Abnormal   Collection Time    12/31/13 11:20 AM      Result Value Ref Range   Troponin I 0.82 (*) <0.30 ng/mL   Comment:            Due to the release kinetics of cTnI,     a negative result within the first hours     of the onset of symptoms does not rule out     myocardial infarction with certainty.      If myocardial infarction is still suspected,     repeat the test at appropriate intervals.     CRITICAL RESULT CALLED TO, READ BACK BY AND VERIFIED WITH:     C.ARMSTRONG,RN 1219 12/31/13 CLARK,S  GLUCOSE, CAPILLARY     Status: Abnormal   Collection Time    12/31/13 11:22 AM      Result Value Ref Range   Glucose-Capillary 241 (*) 70 - 99 mg/dL  URINALYSIS, ROUTINE W REFLEX MICROSCOPIC     Status: Abnormal   Collection Time    12/31/13 12:53 PM      Result Value Ref Range   Color, Urine YELLOW  YELLOW   APPearance CLEAR  CLEAR   Specific Gravity, Urine 1.014  1.005 - 1.030   pH 5.0  5.0 - 8.0   Glucose, UA 100 (*) NEGATIVE mg/dL   Hgb urine dipstick NEGATIVE  NEGATIVE   Bilirubin Urine NEGATIVE  NEGATIVE   Ketones, ur NEGATIVE  NEGATIVE mg/dL   Protein, ur NEGATIVE  NEGATIVE mg/dL   Urobilinogen, UA 0.2  0.0 - 1.0 mg/dL   Nitrite NEGATIVE  NEGATIVE   Leukocytes, UA NEGATIVE  NEGATIVE   Comment: MICROSCOPIC NOT DONE ON URINES WITH NEGATIVE PROTEIN, BLOOD, LEUKOCYTES, NITRITE, OR GLUCOSE <1000 mg/dL.  URINE CULTURE     Status: None   Collection Time    12/31/13 12:53 PM      Result Value Ref Range   Specimen Description URINE, RANDOM     Special Requests NONE     Culture  Setup Time       Value: 12/31/2013 13:29     Performed at [place] Count       Value: 20,OOO COLONIES/ML     Performed at [facility]   Culture       Value: Multiple bacterial morphotypes present, none predominant. Suggest appropriate recollection if clinically indicated.     Performed at [facility]   Report Status 01/01/2014 FINAL    GLUCOSE, CAPILLARY     Status: Abnormal   Collection Time    12/31/13  4:59 PM      Result Value Ref Range   Glucose-Capillary 168 (*) 70 - 99 mg/dL  GLUCOSE, CAPILLARY     Status: Abnormal  Collection Time    12/31/13  8:54 PM      Result Value Ref Range   Glucose-Capillary 143 (*) 70 - 99 mg/dL  BASIC METABOLIC PANEL      Status: Abnormal   Collection Time    01/01/14  3:32 AM      Result Value Ref Range   Sodium 141  137 - 147 mEq/L   Potassium 3.5 (*) 3.7 - 5.3 mEq/L   Chloride 98  96 - 112 mEq/L   CO2 27  19 - 32 mEq/L   Glucose, Bld 140 (*) 70 - 99 mg/dL   BUN 25 (*) 6 - 23 mg/dL   Creatinine, Ser 1.29 (*) 0.50 - 1.10 mg/dL   Calcium 9.8  8.4 - 10.5 mg/dL   GFR calc non Af Amer 40 (*) >90 mL/min   GFR calc Af Amer 46 (*) >90 mL/min   Comment: (NOTE)     The eGFR has been calculated using the CKD EPI equation.     This calculation has not been validated in all clinical situations.     eGFR's persistently <90 mL/min signify possible Chronic Kidney     Disease.  CBC     Status: Abnormal   Collection Time    01/01/14  3:32 AM      Result Value Ref Range   WBC 11.9 (*) 4.0 - 10.5 K/uL   RBC 4.70  3.87 - 5.11 MIL/uL   Hemoglobin 12.7  12.0 - 15.0 g/dL   HCT 37.6  36.0 - 46.0 %   MCV 80.0  78.0 - 100.0 fL   MCH 27.0  26.0 - 34.0 pg   MCHC 33.8  30.0 - 36.0 g/dL   RDW 14.9  11.5 - 15.5 %   Platelets 278  150 - 400 K/uL  HEMOGLOBIN A1C     Status: Abnormal   Collection Time    01/01/14  3:32 AM      Result Value Ref Range   Hemoglobin A1C 9.0 (*) <5.7 %   Comment: (NOTE)                                                                               According to the ADA Clinical Practice Recommendations for 2011, when     HbA1c is used as a screening test:      >=6.5%   Diagnostic of Diabetes Mellitus               (if abnormal result is confirmed)     5.7-6.4%   Increased risk of developing Diabetes Mellitus     References:Diagnosis and Classification of Diabetes Mellitus,Diabetes     DJTT,0177,93(JQZES 1):S62-S69 and Standards of Medical Care in             Diabetes - 2011,Diabetes Care,2011,34 (Suppl 1):S11-S61.   Mean Plasma Glucose 212 (*) <117 mg/dL   Comment: Performed at [place]     Status: Abnormal   Collection Time    01/01/14  3:32 AM      Result Value  Ref Range   Cholesterol 215 (*) 0 - 200 mg/dL   Triglycerides 256 (*) <150 mg/dL  HDL 54  >39 mg/dL   Total CHOL/HDL Ratio 4.0     VLDL 51 (*) 0 - 40 mg/dL   LDL Cholesterol 110 (*) 0 - 99 mg/dL   Comment:            Total Cholesterol/HDL:CHD Risk     Coronary Heart Disease Risk Table                         Men   Women      1/2 Average Risk   3.4   3.3      Average Risk       5.0   4.4      2 X Average Risk   9.6   7.1      3 X Average Risk  23.4   11.0                Use the calculated Patient Ratio     above and the CHD Risk Table     to determine the patient's CHD Risk.                ATP III CLASSIFICATION (LDL):      <100     mg/dL   Optimal      100-129  mg/dL   Near or Above                        Optimal      130-159  mg/dL   Borderline      160-189  mg/dL   High      >190     mg/dL   Very High  GLUCOSE, CAPILLARY     Status: Abnormal   Collection Time    01/01/14  7:48 AM      Result Value Ref Range   Glucose-Capillary 146 (*) 70 - 99 mg/dL  GLUCOSE, CAPILLARY     Status: Abnormal   Collection Time    01/01/14 12:18 PM      Result Value Ref Range   Glucose-Capillary 220 (*) 70 - 99 mg/dL  GLUCOSE, CAPILLARY     Status: Abnormal   Collection Time    01/01/14  4:48 PM      Result Value Ref Range   Glucose-Capillary 148 (*) 70 - 99 mg/dL  GLUCOSE, CAPILLARY     Status: Abnormal   Collection Time    01/01/14  8:29 PM      Result Value Ref Range   Glucose-Capillary 157 (*) 70 - 99 mg/dL  BASIC METABOLIC PANEL     Status: Abnormal   Collection Time    01/02/14  5:18 AM      Result Value Ref Range   Sodium 138  137 - 147 mEq/L   Potassium 4.7  3.7 - 5.3 mEq/L   Comment: HEMOLYSIS AT THIS LEVEL MAY AFFECT RESULT   Chloride 97  96 - 112 mEq/L   CO2 24  19 - 32 mEq/L   Glucose, Bld 150 (*) 70 - 99 mg/dL   BUN 35 (*) 6 - 23 mg/dL   Creatinine, Ser 1.61 (*) 0.50 - 1.10 mg/dL   Calcium 9.5  8.4 - 10.5 mg/dL   GFR calc non Af Amer 30 (*) >90 mL/min   GFR calc  Af Amer 35 (*) >90 mL/min   Comment: (NOTE)     The eGFR has been calculated using the CKD EPI equation.  This calculation has not been validated in all clinical situations.     eGFR's persistently <90 mL/min signify possible Chronic Kidney     Disease.  PROTIME-INR     Status: None   Collection Time    01/02/14  5:18 AM      Result Value Ref Range   Prothrombin Time 13.0  11.6 - 15.2 seconds   INR 1.00  0.00 - 1.49  CBC     Status: Abnormal   Collection Time    01/02/14  5:18 AM      Result Value Ref Range   WBC 11.5 (*) 4.0 - 10.5 K/uL   Comment: WHITE COUNT CONFIRMED ON SMEAR   RBC 4.77  3.87 - 5.11 MIL/uL   Hemoglobin 13.0  12.0 - 15.0 g/dL   HCT 38.4  36.0 - 46.0 %   MCV 80.5  78.0 - 100.0 fL   MCH 27.3  26.0 - 34.0 pg   MCHC 33.9  30.0 - 36.0 g/dL   RDW 15.1  11.5 - 15.5 %   Platelets 302  150 - 400 K/uL   Comment: PLATELET COUNT CONFIRMED BY SMEAR  GLUCOSE, CAPILLARY     Status: Abnormal   Collection Time    01/02/14  8:00 AM      Result Value Ref Range   Glucose-Capillary 169 (*) 70 - 99 mg/dL    Imaging: Nm Myocar Multi W/spect W/wall Motion / Ef  01/01/2014   CLINICAL DATA:  Brittney Mucha is a 73 year old female who was admitted with chest pain. She was not able to achieve target HR while walking so she received Lexiscan  EXAM: MYOCARDIAL IMAGING WITH SPECT (REST AND PHARMACOLOGIC-STRESS)  GATED LEFT VENTRICULAR WALL MOTION STUDY  LEFT VENTRICULAR EJECTION FRACTION  TECHNIQUE: Standard myocardial SPECT imaging was performed after resting intravenous injection of 10 mCi Tc-73m sestamibi. Subsequently, intravenous infusion of Lexiscan was performed under the supervision of the Cardiology staff. At peak effect of the drug, 30 mCi Tc-19m sestamibi was injected intravenously and standard myocardial SPECT imaging was performed. Quantitative gated imaging was also performed to evaluate left ventricular wall motion, and estimate left ventricular ejection fraction.  COMPARISON:   None.  FINDINGS: The patient had some nonspecific ST changes at baseline. This did not significantly worsened with the [place] .  The raw data images reveal no motion artifact. The stress images reveal a medium-sized mild defect in the distal anterior lateral wall. Remaining walls are fairly normal. The resting images reveal improved uptake in the distal anterior and lateral wall. There is evidence of mid and distal anteriolateral ischemia. The SDS equals 9.  The quantitative gated SPECT images reveal an end-diastolic volume of 58 mm. End-systolic volume is 21 mL. The ejection fraction is 64%. In looking at the cine loop pictures, I actually think that the left ventricle systolic function is greater than 64% calculated by the computer.  IMPRESSION: This is interpreted as an abnormal stress Myoview study. There is evidence of ischemia in the distal anterior lateral wall. On the raw data images I do not see a breast shadow to account for this anterolateral attenuation. Her left ventricular systolic function is well-preserved. Ejection fraction is calculated to be 64% but may in fact be greater than 64%.   Electronically Signed   By: Mertie Moores   On: 01/01/2014 16:41    Assessment:  Principal Problem:   NSTEMI (non-ST elevated myocardial infarction) Active Problems:   DIABETES MELLITUS, TYPE II   HYPERCHOLESTEROLEMIA  IIA   Anxiety state, unspecified   HYPERTENSION   CAD - remote LAD and RCA stenting, last cath 12/11- 20% LAD, 40% RCA   Asthma exacerbation   C O P D   OBSTRUCTIVE SLEEP APNEA- non compliant with C-pap   Chest pain with moderate risk of acute coronary syndrome   Dyspnea   Chest pain   Abnormal nuclear stress test   Plan:  1. Mrs. Kuhlmann has an abnormal nuclear stress test suggesting distal anterolateral ischemia. Troponin was positive.  She is currently chest pain free. Creatinine is rising and she is now on 100 cc/hr NS. Plan for possible cath later today if labs improve.  Will hold on DAPT due to possibility of multivessel CAD in diabetic. Also, noted that patient is hyperthyroid - TSH is 0.255.  Check free T4, T3 with am labs. Diabetes is poorly controlled. A1c is 9. Appreciate diabetes coordinator recommendations. Will increase lantus to 40U QHS.  Time Spent Directly with Patient:  15 minutes  Length of Stay:  LOS: 3 days   Pixie Casino, MD, [facility] Attending Cardiologist [facility]  HILTY,Kenneth C 01/02/2014, 9:18 AM

## 2014-01-02 NOTE — H&P (View-Only) (Signed)
DAILY PROGRESS NOTE  Subjective:  No events overnight. Chest pain free today. Creatinine is climbing. Now on NS at 100 cc/hr.  Objective:  Temp:  [97.6 F (36.4 C)-98.8 F (37.1 C)] 98.1 F (36.7 C) (03/06 0416) Pulse Rate:  [76-93] 87 (03/06 0416) Resp:  [16-18] 18 (03/06 0416) BP: (104-149)/(35-95) 135/62 mmHg (03/06 0416) SpO2:  [93 %-98 %] 97 % (03/06 0416) Weight:  [194 lb 12.8 oz (88.361 kg)] 194 lb 12.8 oz (88.361 kg) (03/06 0423) Weight change: -5 lb 11.2 oz (-2.586 kg)  Intake/Output from previous day:    Intake/Output from this shift:    Medications: Current Facility-Administered Medications  Medication Dose Route Frequency Provider Last Rate Last Dose  . 0.9 %  sodium chloride infusion  250 mL Intravenous PRN Cecilie Kicks, NP      . 0.9 %  sodium chloride infusion   Intravenous Continuous Leonie Man, MD 100 mL/hr at 01/02/14 [phone]    . [START ON 01/03/2014] aspirin EC tablet 325 mg  325 mg Oral Daily Oswald Hillock, MD      . budesonide (PULMICORT) nebulizer solution 0.5 mg  0.5 mg Nebulization BID Rise Patience, MD   0.5 mg at 01/01/14 2008  . diazepam (VALIUM) tablet 2 mg  2 mg Oral On Call Cecilie Kicks, NP      . enoxaparin (LOVENOX) injection 40 mg  40 mg Subcutaneous Q24H Rise Patience, MD      . famotidine (PEPCID) tablet 20 mg  20 mg Oral BID Eugenie Filler, MD   20 mg at 01/01/14 2127  . FLUoxetine (PROZAC) tablet 20 mg  20 mg Oral Daily Rise Patience, MD   20 mg at 01/01/14 1243  . furosemide (LASIX) tablet 40-60 mg  40-60 mg Oral BID Rise Patience, MD   40 mg at 01/01/14 1810  . gi cocktail (Maalox,Lidocaine,Donnatal)  30 mL Oral TID PRN Eugenie Filler, MD      . HYDROcodone-acetaminophen (NORCO/VICODIN) 5-325 MG per tablet 1 tablet  1 tablet Oral Q4H PRN Oswald Hillock, MD   1 tablet at 01/01/14 1640  . insulin aspart (novoLOG) injection 0-20 Units  0-20 Units Subcutaneous TID WC Eugenie Filler, MD   3 Units at 01/01/14  1810  . insulin glargine (LANTUS) injection 30 Units  30 Units Subcutaneous QHS Rise Patience, MD   30 Units at 01/01/14 2128  . levalbuterol (XOPENEX) nebulizer solution 0.63 mg  0.63 mg Nebulization Q6H PRN Rise Patience, MD      . levalbuterol Penne Lash) nebulizer solution 0.63 mg  0.63 mg Nebulization TID Eugenie Filler, MD   0.63 mg at 01/01/14 2008  . loratadine (CLARITIN) tablet 10 mg  10 mg Oral Daily Rise Patience, MD   10 mg at 01/01/14 1243  . LORazepam (ATIVAN) tablet 1 mg  1 mg Oral QHS Rise Patience, MD   1 mg at 01/01/14 2127  . nebivolol (BYSTOLIC) tablet 10 mg  10 mg Oral Daily Troy Sine, MD      . potassium chloride (K-DUR) CR tablet 10 mEq  10 mEq Oral Daily Rise Patience, MD   10 mEq at 01/01/14 1242  . predniSONE (DELTASONE) tablet 5 mg  5 mg Oral Daily Rise Patience, MD   5 mg at 01/01/14 1242  . simvastatin (ZOCOR) tablet 20 mg  20 mg Oral q1800 Rise Patience, MD   20 mg at 01/01/14  1809  . sodium chloride 0.9 % injection 3 mL  3 mL Intravenous Q12H Cecilie Kicks, NP   3 mL at 01/01/14 2129  . sodium chloride 0.9 % injection 3 mL  3 mL Intravenous PRN Cecilie Kicks, NP        Physical Exam: General appearance: alert and no distress Lungs: clear to auscultation bilaterally Heart: regular rate and rhythm, S1, S2 normal, no murmur, click, rub or gallop Abdomen: soft, non-tender; bowel sounds normal; no masses,  no organomegaly Extremities: extremities normal, atraumatic, no cyanosis or edema  Lab Results: Results for orders placed during the hospital encounter of 12/30/13 (from the past 48 hour(s))  TROPONIN I     Status: Abnormal   Collection Time    12/31/13 11:20 AM      Result Value Ref Range   Troponin I 0.82 (*) <0.30 ng/mL   Comment:            Due to the release kinetics of cTnI,     a negative result within the first hours     of the onset of symptoms does not rule out     myocardial infarction with certainty.      If myocardial infarction is still suspected,     repeat the test at appropriate intervals.     CRITICAL RESULT CALLED TO, READ BACK BY AND VERIFIED WITH:     C.ARMSTRONG,RN 1219 12/31/13 CLARK,S  GLUCOSE, CAPILLARY     Status: Abnormal   Collection Time    12/31/13 11:22 AM      Result Value Ref Range   Glucose-Capillary 241 (*) 70 - 99 mg/dL  URINALYSIS, ROUTINE W REFLEX MICROSCOPIC     Status: Abnormal   Collection Time    12/31/13 12:53 PM      Result Value Ref Range   Color, Urine YELLOW  YELLOW   APPearance CLEAR  CLEAR   Specific Gravity, Urine 1.014  1.005 - 1.030   pH 5.0  5.0 - 8.0   Glucose, UA 100 (*) NEGATIVE mg/dL   Hgb urine dipstick NEGATIVE  NEGATIVE   Bilirubin Urine NEGATIVE  NEGATIVE   Ketones, ur NEGATIVE  NEGATIVE mg/dL   Protein, ur NEGATIVE  NEGATIVE mg/dL   Urobilinogen, UA 0.2  0.0 - 1.0 mg/dL   Nitrite NEGATIVE  NEGATIVE   Leukocytes, UA NEGATIVE  NEGATIVE   Comment: MICROSCOPIC NOT DONE ON URINES WITH NEGATIVE PROTEIN, BLOOD, LEUKOCYTES, NITRITE, OR GLUCOSE <1000 mg/dL.  URINE CULTURE     Status: None   Collection Time    12/31/13 12:53 PM      Result Value Ref Range   Specimen Description URINE, RANDOM     Special Requests NONE     Culture  Setup Time       Value: 12/31/2013 13:29     Performed at [facility]       Value: 20,OOO COLONIES/ML     Performed at [facility]   Culture       Value: Multiple bacterial morphotypes present, none predominant. Suggest appropriate recollection if clinically indicated.     Performed at [facility]   Report Status 01/01/2014 FINAL    GLUCOSE, CAPILLARY     Status: Abnormal   Collection Time    12/31/13  4:59 PM      Result Value Ref Range   Glucose-Capillary 168 (*) 70 - 99 mg/dL  GLUCOSE, CAPILLARY     Status: Abnormal  Collection Time    12/31/13  8:54 PM      Result Value Ref Range   Glucose-Capillary 143 (*) 70 - 99 mg/dL  BASIC METABOLIC PANEL      Status: Abnormal   Collection Time    01/01/14  3:32 AM      Result Value Ref Range   Sodium 141  137 - 147 mEq/L   Potassium 3.5 (*) 3.7 - 5.3 mEq/L   Chloride 98  96 - 112 mEq/L   CO2 27  19 - 32 mEq/L   Glucose, Bld 140 (*) 70 - 99 mg/dL   BUN 25 (*) 6 - 23 mg/dL   Creatinine, Ser 1.29 (*) 0.50 - 1.10 mg/dL   Calcium 9.8  8.4 - 10.5 mg/dL   GFR calc non Af Amer 40 (*) >90 mL/min   GFR calc Af Amer 46 (*) >90 mL/min   Comment: (NOTE)     The eGFR has been calculated using the CKD EPI equation.     This calculation has not been validated in all clinical situations.     eGFR's persistently <90 mL/min signify possible Chronic Kidney     Disease.  CBC     Status: Abnormal   Collection Time    01/01/14  3:32 AM      Result Value Ref Range   WBC 11.9 (*) 4.0 - 10.5 K/uL   RBC 4.70  3.87 - 5.11 MIL/uL   Hemoglobin 12.7  12.0 - 15.0 g/dL   HCT 37.6  36.0 - 46.0 %   MCV 80.0  78.0 - 100.0 fL   MCH 27.0  26.0 - 34.0 pg   MCHC 33.8  30.0 - 36.0 g/dL   RDW 14.9  11.5 - 15.5 %   Platelets 278  150 - 400 K/uL  HEMOGLOBIN A1C     Status: Abnormal   Collection Time    01/01/14  3:32 AM      Result Value Ref Range   Hemoglobin A1C 9.0 (*) <5.7 %   Comment: (NOTE)                                                                               According to the ADA Clinical Practice Recommendations for 2011, when     HbA1c is used as a screening test:      >=6.5%   Diagnostic of Diabetes Mellitus               (if abnormal result is confirmed)     5.7-6.4%   Increased risk of developing Diabetes Mellitus     References:Diagnosis and Classification of Diabetes Mellitus,Diabetes     WNUU,7253,66(YQIHK 1):S62-S69 and Standards of Medical Care in             Diabetes - 2011,Diabetes Care,2011,34 (Suppl 1):S11-S61.   Mean Plasma Glucose 212 (*) <117 mg/dL   Comment: Performed at [facility]     Status: Abnormal   Collection Time    01/01/14  3:32 AM      Result Value  Ref Range   Cholesterol 215 (*) 0 - 200 mg/dL   Triglycerides 256 (*) <150 mg/dL  HDL 54  >39 mg/dL   Total CHOL/HDL Ratio 4.0     VLDL 51 (*) 0 - 40 mg/dL   LDL Cholesterol 110 (*) 0 - 99 mg/dL   Comment:            Total Cholesterol/HDL:CHD Risk     Coronary Heart Disease Risk Table                         Men   Women      1/2 Average Risk   3.4   3.3      Average Risk       5.0   4.4      2 X Average Risk   9.6   7.1      3 X Average Risk  23.4   11.0                Use the calculated Patient Ratio     above and the CHD Risk Table     to determine the patient's CHD Risk.                ATP III CLASSIFICATION (LDL):      <100     mg/dL   Optimal      100-129  mg/dL   Near or Above                        Optimal      130-159  mg/dL   Borderline      160-189  mg/dL   High      >190     mg/dL   Very High  GLUCOSE, CAPILLARY     Status: Abnormal   Collection Time    01/01/14  7:48 AM      Result Value Ref Range   Glucose-Capillary 146 (*) 70 - 99 mg/dL  GLUCOSE, CAPILLARY     Status: Abnormal   Collection Time    01/01/14 12:18 PM      Result Value Ref Range   Glucose-Capillary 220 (*) 70 - 99 mg/dL  GLUCOSE, CAPILLARY     Status: Abnormal   Collection Time    01/01/14  4:48 PM      Result Value Ref Range   Glucose-Capillary 148 (*) 70 - 99 mg/dL  GLUCOSE, CAPILLARY     Status: Abnormal   Collection Time    01/01/14  8:29 PM      Result Value Ref Range   Glucose-Capillary 157 (*) 70 - 99 mg/dL  BASIC METABOLIC PANEL     Status: Abnormal   Collection Time    01/02/14  5:18 AM      Result Value Ref Range   Sodium 138  137 - 147 mEq/L   Potassium 4.7  3.7 - 5.3 mEq/L   Comment: HEMOLYSIS AT THIS LEVEL MAY AFFECT RESULT   Chloride 97  96 - 112 mEq/L   CO2 24  19 - 32 mEq/L   Glucose, Bld 150 (*) 70 - 99 mg/dL   BUN 35 (*) 6 - 23 mg/dL   Creatinine, Ser 1.61 (*) 0.50 - 1.10 mg/dL   Calcium 9.5  8.4 - 10.5 mg/dL   GFR calc non Af Amer 30 (*) >90 mL/min   GFR calc  Af Amer 35 (*) >90 mL/min   Comment: (NOTE)     The eGFR has been calculated using the CKD EPI equation.  This calculation has not been validated in all clinical situations.     eGFR's persistently <90 mL/min signify possible Chronic Kidney     Disease.  PROTIME-INR     Status: None   Collection Time    01/02/14  5:18 AM      Result Value Ref Range   Prothrombin Time 13.0  11.6 - 15.2 seconds   INR 1.00  0.00 - 1.49  CBC     Status: Abnormal   Collection Time    01/02/14  5:18 AM      Result Value Ref Range   WBC 11.5 (*) 4.0 - 10.5 K/uL   Comment: WHITE COUNT CONFIRMED ON SMEAR   RBC 4.77  3.87 - 5.11 MIL/uL   Hemoglobin 13.0  12.0 - 15.0 g/dL   HCT 38.4  36.0 - 46.0 %   MCV 80.5  78.0 - 100.0 fL   MCH 27.3  26.0 - 34.0 pg   MCHC 33.9  30.0 - 36.0 g/dL   RDW 15.1  11.5 - 15.5 %   Platelets 302  150 - 400 K/uL   Comment: PLATELET COUNT CONFIRMED BY SMEAR  GLUCOSE, CAPILLARY     Status: Abnormal   Collection Time    01/02/14  8:00 AM      Result Value Ref Range   Glucose-Capillary 169 (*) 70 - 99 mg/dL    Imaging: Nm Myocar Multi W/spect W/wall Motion / Ef  01/01/2014   CLINICAL DATA:  [name] Lane is a 74 year old female who was admitted with chest pain. She was not able to achieve target HR while walking so she received Lexiscan  EXAM: MYOCARDIAL IMAGING WITH SPECT (REST AND PHARMACOLOGIC-STRESS)  GATED LEFT VENTRICULAR WALL MOTION STUDY  LEFT VENTRICULAR EJECTION FRACTION  TECHNIQUE: Standard myocardial SPECT imaging was performed after resting intravenous injection of 10 mCi Tc-60m sestamibi. Subsequently, intravenous infusion of Lexiscan was performed under the supervision of the Cardiology staff. At peak effect of the drug, 30 mCi Tc-33m sestamibi was injected intravenously and standard myocardial SPECT imaging was performed. Quantitative gated imaging was also performed to evaluate left ventricular wall motion, and estimate left ventricular ejection fraction.  COMPARISON:   None.  FINDINGS: The patient had some nonspecific ST changes at baseline. This did not significantly worsened with the [place] .  The raw data images reveal no motion artifact. The stress images reveal a medium-sized mild defect in the distal anterior lateral wall. Remaining walls are fairly normal. The resting images reveal improved uptake in the distal anterior and lateral wall. There is evidence of mid and distal anteriolateral ischemia. The SDS equals 9.  The quantitative gated SPECT images reveal an end-diastolic volume of 58 mm. End-systolic volume is 21 mL. The ejection fraction is 64%. In looking at the cine loop pictures, I actually think that the left ventricle systolic function is greater than 64% calculated by the computer.  IMPRESSION: This is interpreted as an abnormal stress Myoview study. There is evidence of ischemia in the distal anterior lateral wall. On the raw data images I do not see a breast shadow to account for this anterolateral attenuation. Her left ventricular systolic function is well-preserved. Ejection fraction is calculated to be 64% but may in fact be greater than 64%.   Electronically Signed   By: Mertie Moores   On: 01/01/2014 16:41    Assessment:  Principal Problem:   NSTEMI (non-ST elevated myocardial infarction) Active Problems:   DIABETES MELLITUS, TYPE II   HYPERCHOLESTEROLEMIA  IIA   Anxiety state, unspecified   HYPERTENSION   CAD - remote LAD and RCA stenting, last cath 12/11- 20% LAD, 40% RCA   Asthma exacerbation   C O P D   OBSTRUCTIVE SLEEP APNEA- non compliant with C-pap   Chest pain with moderate risk of acute coronary syndrome   Dyspnea   Chest pain   Abnormal nuclear stress test   Plan:  1. [name] Lane has an abnormal nuclear stress test suggesting distal anterolateral ischemia. Troponin was positive.  She is currently chest pain free. Creatinine is rising and she is now on 100 cc/hr NS. Plan for possible cath later today if labs improve.  Will hold on DAPT due to possibility of multivessel CAD in diabetic. Also, noted that patient is hyperthyroid - TSH is 0.255.  Check free T4, T3 with am labs. Diabetes is poorly controlled. A1c is 9. Appreciate diabetes coordinator recommendations. Will increase lantus to 40U QHS.  Time Spent Directly with Patient:  15 minutes  Length of Stay:  LOS: 3 days   Pixie Casino, MD, [facility] Attending Cardiologist [facility]  HILTY,Kenneth C 01/02/2014, 9:18 AM

## 2014-01-02 NOTE — Progress Notes (Signed)
Results of BMET called to [place], [place] as well as cath lab

## 2014-01-03 DIAGNOSIS — I214 Non-ST elevation (NSTEMI) myocardial infarction: Secondary | ICD-10-CM

## 2014-01-03 LAB — CBC
HCT: 39.6 % (ref 36.0–46.0)
Hemoglobin: 13.3 g/dL (ref 12.0–15.0)
MCH: 27 pg (ref 26.0–34.0)
MCHC: 33.6 g/dL (ref 30.0–36.0)
MCV: 80.5 fL (ref 78.0–100.0)
Platelets: 271 10*3/uL (ref 150–400)
RBC: 4.92 MIL/uL (ref 3.87–5.11)
RDW: 15 % (ref 11.5–15.5)
WBC: 9.8 10*3/uL (ref 4.0–10.5)

## 2014-01-03 LAB — GLUCOSE, CAPILLARY
GLUCOSE-CAPILLARY: 143 mg/dL — AB (ref 70–99)
Glucose-Capillary: 153 mg/dL — ABNORMAL HIGH (ref 70–99)
Glucose-Capillary: 201 mg/dL — ABNORMAL HIGH (ref 70–99)
Glucose-Capillary: 177 mg/dL — ABNORMAL HIGH (ref 70–99)

## 2014-01-03 LAB — BASIC METABOLIC PANEL
BUN: 22 mg/dL (ref 6–23)
CHLORIDE: 97 meq/L (ref 96–112)
CO2: 25 mEq/L (ref 19–32)
CREATININE: 1.28 mg/dL — AB (ref 0.50–1.10)
Calcium: 9.5 mg/dL (ref 8.4–10.5)
GFR, EST AFRICAN AMERICAN: 47 mL/min — AB (ref >90)
GFR, EST NON AFRICAN AMERICAN: 40 mL/min — AB (ref >90)
Glucose, Bld: 132 mg/dL — ABNORMAL HIGH (ref 70–99)
POTASSIUM: 3.5 meq/L — AB (ref 3.7–5.3)
Sodium: 138 mEq/L (ref 137–147)

## 2014-01-03 LAB — T4, FREE: FREE T4: 1.47 ng/dL (ref 0.80–1.80)

## 2014-01-03 LAB — T3: T3 TOTAL: 76.5 ng/dL — AB (ref 80.0–204.0)

## 2014-01-03 MED ORDER — MORPHINE SULFATE 2 MG/ML IJ SOLN: 2.0000 mg | INTRAMUSCULAR | Status: DC | PRN

## 2014-01-03 MED ORDER — POTASSIUM CHLORIDE CRYS ER 20 MEQ PO TBCR
40.0000 meq | EXTENDED_RELEASE_TABLET | Freq: Once | ORAL | Status: AC
  Administered 2014-01-03: 40 meq via ORAL
  Filled 2014-01-03: qty 2

## 2014-01-03 NOTE — Progress Notes (Signed)
[facility] PROGRESS NOTE  [name] Lane ZOX:[id number] DOB: [birth year] DOA: 12/30/2013 PCP: Lupita Dawn, MD  Assessment/Plan:  1 N-STEMI;  Patient currently chest pain-free.  troponin came back elevated at 0.82.   Underwent Cardiolite stress test, which is positive. Patient S/P Cath 3-6 and S/P PCI of proximal D1 with a Promus Premier DES. Continue with Brilinta.  Plan to repeat Cath on Monday  for staged PCI to proximal RCA.    2 COPD/asthma Continue Pulmicort and nebulizers for now.  3 Type 2 diabetes  hemoglobin A1c is 9.0. Continue home dose of Lantus. Sliding scale insulin. Might need to decrease lantus dose 3-8 anticipating NPO status for Monday.   4 Hypertension Continue Lasix, bystolic.  5 Hyperlipidemia LDL is 110, total cholesterol 215, TG 256  6 Obstructive sleep apnea Place on CPAP each bedtime.  7 AKI Received IV fluids.  Cr stable post cath.  Monitor on lasix.   8 Prophylaxis Pepcid for GI prophylaxis. Lovenox/SCDs  for DVT prophylaxis.  Code Status: Full Family Communication: Updated patient. Disposition Plan: Home when medically stable.   Consultants:  Cardiology: Dr. Myrtis Ser 12/30/2013  Procedures:  Chest x-ray 12/30/2013  Cath 3-6- 2015 Severe 2 vessel CAD with 95% proximal D1 lesion -os consistent with the stress test, and therefore the culprit lesion. Also noted is a roughly 80% napkin ring lesion in the proximal RCA just prior to the stent. Lastly there is a 99% ostial stenosis of a small RPL 3.  Successful PCI of proximal D1 with a Promus Premier DES 2.25 mm x 12 mm (2.4 mm postdilated)  Aortic pressure: 1:30/58/81 mmHg; LV pressures 121/7/9 mmHg .   Antibiotics:  None  HPI/Subjective: Patient feeling  Well. No chest pain, no dyspnea.  Had BM yesterday.   Objective: Filed Vitals:   01/03/14 0732  BP: 124/61  Pulse: 89  Temp: 98.1 F (36.7 C)  Resp: 18    Intake/Output Summary (Last 24 hours) at 01/03/14 0822 Last  data filed at 01/03/14 0416  Gross per 24 hour  Intake  726.2 ml  Output   2750 ml  Net -2023.8 ml   Filed Weights   12/31/13 0945 01/01/14 0543 01/02/14 0423  Weight: 90.946 kg (200 lb 8 oz) 83.28 kg (183 lb 9.6 oz) 88.361 kg (194 lb 12.8 oz)    Exam:        Physical Exam: Head: Normocephalic, atraumatic.  Eyes: No signs of jaundice, EOMI Nose: Mucous membranes dry.  Throat: Oropharynx nonerythematous, no exudate appreciated.  Neck: supple,No deformities, masses, or tenderness noted. Lungs: Normal respiratory effort. B/L Clear to auscultation, no crackles or wheezes.  Heart: Regular RR. S1 and S2 normal  Abdomen: BS normoactive. Soft, Nondistended, non-tender.  Extremities: No pretibial edema, no erythema   Data Reviewed: Basic Metabolic Panel:  Recent Labs Lab 12/31/13 0520 01/01/14 0332 01/02/14 0518 01/02/14 1135 01/03/14 0352  NA 139 141 138 140 138  K 4.0 3.5* 4.7 4.2 3.5*  CL 99 98 97 100 97  CO2 19 27 24 24 25   GLUCOSE 383* 140* 150* 127* 132*  BUN 16 25* 35* 31* 22  CREATININE 0.99 1.29* 1.61* 1.42* 1.28*  CALCIUM 9.3 9.8 9.5 9.3 9.5   Liver Function Tests:  Recent Labs Lab 12/31/13 0520  AST 16  ALT 13  ALKPHOS 56  BILITOT 0.3  PROT 7.0  ALBUMIN 3.4*   No results found for this basename: LIPASE, AMYLASE,  in the last 168 hours No results found for this basename:  AMMONIA,  in the last 168 hours CBC:  Recent Labs Lab 12/30/13 1727 12/30/13 1748 12/31/13 0520 01/01/14 0332 01/02/14 0518 01/03/14 0352  WBC 9.1  --  10.8* 11.9* 11.5* 9.8  NEUTROABS 6.7  --  10.1*  --   --   --   HGB 12.4 13.6 12.4 12.7 13.0 13.3  HCT 37.0 40.0 37.1 37.6 38.4 39.6  MCV 79.9  --  79.6 80.0 80.5 80.5  PLT 249  --  279 278 302 271   Cardiac Enzymes:  Recent Labs Lab 12/30/13 1728 12/31/13 0050 12/31/13 0517 12/31/13 1120  TROPONINI <0.30 <0.30 <0.30 0.82*   BNP (last 3 results)  Recent Labs  12/30/13 1728  PROBNP 320.8*   CBG:  Recent  Labs Lab 01/01/14 2029 01/02/14 0800 01/02/14 1149 01/02/14 1703 01/02/14 2204  GLUCAP 157* 169* 132* 135* 116*    Recent Results (from the past 240 hour(s))  URINE CULTURE     Status: None   Collection Time    12/31/13 12:53 PM      Result Value Ref Range Status   Specimen Description URINE, RANDOM   Final   Special Requests NONE   Final   Culture  Setup Time     Final   Value: 12/31/2013 13:29     Performed at [facility]   [place] Count     Final   Value: 20,OOO COLONIES/ML     [facility]formed at [facility]   Culture     Final   Value: Multiple bacterial morphotypes present, none predominant. Suggest appropriate recollection if clinically indicated.     Performed at [facility]   Report Status 01/01/2014 FINAL   Final     Studies: Nm Myocar Multi W/spect W/wall Motion / Ef  01/01/2014   CLINICAL DATA:  [name] Lane is a 73 year old female who was admitted with chest pain. She was not able to achieve target HR while walking so she received Lexiscan  EXAM: MYOCARDIAL IMAGING WITH SPECT (REST AND PHARMACOLOGIC-STRESS)  GATED LEFT VENTRICULAR WALL MOTION STUDY  LEFT VENTRICULAR EJECTION FRACTION  TECHNIQUE: Standard myocardial SPECT imaging was performed after resting intravenous injection of 10 mCi Tc-5380m sestamibi. Subsequently, intravenous infusion of Lexiscan was performed under the supervision of the Cardiology staff. At peak effect of the drug, 30 mCi Tc-5980m sestamibi was injected intravenously and standard myocardial SPECT imaging was performed. Quantitative gated imaging was also performed to evaluate left ventricular wall motion, and estimate left ventricular ejection fraction.  COMPARISON:  None.  FINDINGS: The patient had some nonspecific ST changes at baseline. This did not significantly worsened with the Lexiscan .  The raw data images reveal no motion artifact. The stress images reveal a medium-sized mild defect in the distal anterior lateral wall.  Remaining walls are fairly normal. The resting images reveal improved uptake in the distal anterior and lateral wall. There is evidence of mid and distal anteriolateral ischemia. The SDS equals 9.  The quantitative gated SPECT images reveal an end-diastolic volume of 58 mm. End-systolic volume is 21 mL. The ejection fraction is 64%. In looking at the cine loop pictures, I actually think that the left ventricle systolic function is greater than 64% calculated by the computer.  IMPRESSION: This is interpreted as an abnormal stress Myoview study. There is evidence of ischemia in the distal anterior lateral wall. On the raw data images I do not see a breast shadow to account for this anterolateral attenuation. Her left ventricular systolic function  is well-preserved. Ejection fraction is calculated to be 64% but may in fact be greater than 64%.   Electronically Signed   By: Kristeen Miss   On: 01/01/2014 16:41    Scheduled Meds: . aspirin  81 mg Oral Daily  . budesonide  0.5 mg Nebulization BID  . enoxaparin (LOVENOX) injection  40 mg Subcutaneous Q24H  . famotidine  20 mg Oral BID  . FLUoxetine  20 mg Oral Daily  . furosemide  40 mg Oral BID  . insulin aspart  0-20 Units Subcutaneous TID WC  . insulin glargine  40 Units Subcutaneous QHS  . levalbuterol  0.63 mg Nebulization TID  . loratadine  10 mg Oral Daily  . LORazepam  1 mg Oral QHS  . nebivolol  10 mg Oral Daily  . potassium chloride  10 mEq Oral Daily  . predniSONE  5 mg Oral Daily  . simvastatin  20 mg Oral q1800  . sodium chloride  3 mL Intravenous Q12H  . Ticagrelor  90 mg Oral BID   Continuous Infusions:    Principal Problem:   NSTEMI (non-ST elevated myocardial infarction) Active Problems:   DIABETES MELLITUS, TYPE II   HYPERCHOLESTEROLEMIA  IIA   Anxiety state, unspecified   HYPERTENSION   CAD - remote LAD and RCA stenting, last cath 12/11- 20% LAD, 40% RCA   Asthma exacerbation   C O P D   OBSTRUCTIVE SLEEP APNEA- non  compliant with C-pap   Chest pain with moderate risk of acute coronary syndrome   Dyspnea   Chest pain   Abnormal nuclear stress test    Time spent: 25 min    Blima Rich MD [facility] Pager [phone]. If 7PM-7AM, please contact night-coverage at [email or web address], password [facility] 01/03/2014, 8:22 AM  LOS: 4 days

## 2014-01-03 NOTE — Progress Notes (Signed)
Patient placed on CPAP.  Tolerating well at this time.

## 2014-01-03 NOTE — Progress Notes (Signed)
   CARE MANAGEMENT NOTE 01/03/2014  Patient:  [name] Lane, [name] Lane   Account Number:  [id number]  Date Initiated:  01/03/2014  Documentation initiated by:  [facility]  Subjective/Objective Assessment:   adm:  Chest pain     Action/Plan:   discharge planning   Anticipated DC Date:  01/04/2014   Anticipated DC Plan:  [place]  CM consult  Medication Assistance      Choice offered to / List presented to:             Status of service:  Completed, signed off Medicare Important Message given?   (If response is "NO", the following Medicare IM given date fields will be blank) Date Medicare IM given:   Date Additional Medicare IM given:    Discharge Disposition:  HOME/SELF CARE  Per UR Regulation:    If discussed at Long Length of Stay Meetings, dates discussed:    Comments:  01/03/14 09:00 CM met spoke with pt in room and gave her a Brilinta starter kit and a discount card.  Pt verbalized understanding she may have to have new medication pre-authorized at her MD office followup visit and will make sure she asks her followup MD.  No other CM needs were communicated.  Mariane Masters, BSN, CM [phone].

## 2014-01-03 NOTE — CV Procedure (Signed)
Marland Kitchen  CARDIAC CATHETERIZATION AND PERCUTANEOUS CORONARY INTERVENTION REPORT  NAME:  [name] Lane   MRN: [id number] DOB:  [birth year]   ADMIT DATE: 12/30/2013 Procedure Date: 01/03/2014  INTERVENTIONAL CARDIOLOGIST: Leonie Man, M.D., MS PRIMARY CARE PROVIDER: Claiborne Billings, MD PRIMARY CARDIOLOGIST: Troy Sine., MD  PATIENT:  [name] Lane is a 73 y.o. female with known history of CAD - PCI to the LAD and RCA in 2003. She presented with signs and symptoms of resting chest pain that was somewhat atypical in nature. Initial cardiac enzymes are negative. Followup troponin did return +0.8. She also had a stress test performed which showed apical anterolateral ischemia. He is therefore referred for invasive evaluation cardiac catheterization. Due to concern for reduced renal function, left ventriculography was not performed.  PRE-OPERATIVE DIAGNOSIS:    Non-STEMI  Abnormal Nuclear Stress Test with Apical Anterolateral Ischemia  PROCEDURES PERFORMED:    Left Heart Catheterization with Coronary Angiography  Percutaneous Coronary Intervention of the proximal First Diagonal 90% stenosis reduced to 0%: Promus Premier DES 2.25 mm x 12 mm (2.4 mm)  PROCEDURE:Consent:  Risks of procedure as well as the alternatives and risks of each were explained to the (patient/caregiver).  Consent for procedure obtained. Consent for signed by MD and patient with RN witness -- placed on chart.   PROCEDURE: The patient was brought to the 2nd [facility] in the fasting state and prepped and draped in the usual sterile fashion for Right groin or radial access. A modified Allen's test with plethysmography was performed, revealing excellent Ulnar artery collateral flow.  Sterile technique was used including antiseptics, cap, gloves, gown, hand hygiene, mask and sheet.  Skin prep: Chlorhexidine.  Time Out: Verified patient identification, verified procedure, site/side was marked,  verified correct patient position, special equipment/implants available, medications/allergies/relevent history reviewed, required imaging and test results available.  Performed  Access: Right Radial Artery; 6 Fr Sheath -- Seldinger technique (Angiocath Micropuncture Kit)  IA Radial Cocktail, IV Heparin 5000 units   Diagnostic Left Heart Catheterization:  5 French TIG 4.0 catheter advanced and exchanged over longer safety J-wire into the descending aorta and used to engage the left and right coronary artery for selective coronary angiography. Catheter then advanced over the wire into the left ventricle for measurement of a left ventricular hemodynamics.   TR Band:  1650 Hours, 13 mL air  Hemodynamics:  Central Aortic / Mean Pressures: 130/58 mmHg; 81 mmHg  Left Ventricular Pressures / EDP: 121/7 mmHg; 9 mmHg  Left Ventriculography: Not performed to conserve contrast  Coronary Anatomy:  Left Main: Large-caliber, long vessel that bifurcates into the LAD and Circumflex. Mildly calcified but otherwise angiographically normal. LAD: Begin a large caliber vessel with a patent stent just prior to SP1. Just after SP1 the vessel bifurcates into the equal size the distal LAD and Diagonal #1 (D1). There is a mild 20% stenosis at the bifurcation but the vessel beyond is tortuous but free of disease in the tapers down to the apex. The diameter of the LAD after the bifurcation is notably smaller than proximal.  D1: Small to moderate caliber vessel that is somewhat tortuous distally with several branches has a proximal 90-95% stenosis that is quite focal.  Likely culprit lesion.  Left Circumflex: Large-caliber vessel that bifurcates early into a lateral OM 1 which is at least moderate to large in caliber and ACE moderate caliber AV circumflex is terminates as 2 small posterolateral branches.  Minimal luminal irregularities in the circumflex  system   RCA: Large caliber, dominant vessel in the early  mid/proximal segment there is a napkin ringing 80% stenosis just prior to the mid vessel stent. The stent Itself appeared to be patent.  There is diffuse mild luminal irregularities throughout the rest the mid and distal vessel that bifurcates distally into the Right Posterior Descending Artery (RPDA) and the Right Posterior AV Groove Branch (RPAV)  RPDA: Moderate caliber somewhat tortuous vessel with diffuse mild luminal irregularities in reaches almost to the apex.  RPL Sysytem:The RPAV begins as a moderate caliber vessel gives rise to small RPL 1 in terminates as a moderate caliber major RPL 2. It does give off a small RPL 3 that has a roughly 90% stenosis. This is a less than 2 mm vessel.  After reviewing the initial angiography, the culprit lesion was thought to be the 90-95% D1 lesion, however the proximal RCA & ostial RPL3 lesions are also of concern.  Based upon the Myoview suggesting apical anterolateral ischemia, the decision was mae to proceed with PCI of the D1 lesion first with staged PCI of RCA after allowing for contrast washout.  Preparation were made to proceed with PCI on this lesion.  Percutaneous Coronary Intervention:  Diagonal 1 90-95% stenosis, TIMI 3 flow --> reduced to 0% with TIMI3 flow. Guide: 6 Fr   XB LAD 3.5 Guidewire: BMW Predilation Balloon: Sprinter Legend 2.0 mm x  12 mm;   9 Atm x 30 Sec,  Stent: Promus Premier DES 2.25 mm x 12 mm;   Deployment: 12 Atm x 30 Sec,   Post-dilation with Stent Balloon: 16 mm x 30 mm;   Final Diameter - 2.4 mm  Post deployment angiography in multiple views, with and without guidewire in place revealed excellent stent deployment and lesion coverage.  There was no evidence of dissection or perforation.  Initial post stent spasm was treated with IV NTG injection.  MEDICATIONS:  Anesthesia:  Local Lidocaine 2 ml  Sedation:  2 mg IV Versed, 50 mcg IV fentanyl ;   Omnipaque Contrast: 110 ml  Anticoagulation:  IV Heparin 5000 Units  + additional 2000 Units for PCI (ACT greater than 300 Sec)   Anti-Platelet Agent:  Brilinta 180 mg, Aspirin 81 mg  PATIENT DISPOSITION:    The patient was transferred to the PACU holding area in a hemodynamicaly stable, chest pain free condition.  The patient tolerated the procedure well, and there were no complications.  EBL:   < 10 ml  The patient was stable before, during, and after the procedure.  POST-OPERATIVE DIAGNOSIS:    Severe 2 vessel CAD with 95% proximal D1 lesion -os consistent with the stress test, and therefore the culprit lesion. Also noted is a roughly 80% napkin ring lesion in the proximal RCA just prior to the stent. Lastly there is a 99% ostial stenosis of a small RPL 3 that is not PCI amenable.   Successful PCI of proximal D1 with a Promus Premier DES 2.25 mm x 12 mm (2.4 mm postdilated)  Normal LVEDP, indicating adequate diuresis.  PLAN OF CARE:  Transferred to post procedure unit for overnight post catheterization care.  Post cath hydration to allow for contrast washout period  Dual therapy with aspirin plus Brilinta (versus Plavix) for a minimum of 1 year.  Planned staged PCI of RCA lesion on Monday with anticipated discharge as early as Monday evening versus Tuesday morning.  Continue aggressive respiratory modification.   Leonie Man, M.D., M.S. [facility] [address]. [place], [place]  [address]  [phone]  01/03/2014 9:06 AM

## 2014-01-03 NOTE — Progress Notes (Signed)
PROGRESS NOTE  Subjective:   [name] Lane is a 73 yo admitted with CP. myoview showed a distal ant. Lat area of ischema.   She was found to have a tight diag stenosis and a tight RCA stenosis on cath.  She has had her diag stented. The plan is for PCI on the RCA on Monday.   Objective:    Vital Signs:   Temp:  [97.6 F (36.4 C)-98.1 F (36.7 C)] 98.1 F (36.7 C) (03/07 0732) Pulse Rate:  [77-89] 89 (03/07 0732) Resp:  [15-18] 18 (03/07 0732) BP: (124-169)/(50-111) 124/61 mmHg (03/07 0732) SpO2:  [94 %-99 %] 97 % (03/07 0732)  Last BM Date: 01/01/14   24-hour weight change: Weight change:   Weight trends: Filed Weights   12/31/13 0945 01/01/14 0543 01/02/14 0423  Weight: 200 lb 8 oz (90.946 kg) 183 lb 9.6 oz (83.28 kg) 194 lb 12.8 oz (88.361 kg)    Intake/Output:  03/06 0701 - 03/07 0700 In: 726.2 [P.O.:240; I.V.:486.2] Out: 2750 [Urine:2750]     Physical Exam: BP 124/61  Pulse 89  Temp(Src) 98.1 F (36.7 C) (Oral)  Resp 18  Ht 5\' 4"  (1.626 m)  Wt 194 lb 12.8 oz (88.361 kg)  BMI 33.42 kg/m2  SpO2 97%  Wt Readings from Last 3 Encounters:  01/02/14 194 lb 12.8 oz (88.361 kg)  01/02/14 194 lb 12.8 oz (88.361 kg)  10/01/12 199 lb 12.8 oz (90.629 kg)    General: Vital signs reviewed and noted.   Head: Normocephalic, atraumatic.  Eyes: conjunctivae/corneas clear.  EOM's intact.   Throat: normal  Neck:  normal  Lungs:    few wheezes  Heart:  RR, normal S1S2  Abdomen:  Soft, non-tender, non-distended    Extremities: Normal, right radial cath site looks ok    Neurologic: A&O X3, CN II - XII are grossly intact.   Psych: Normal     Labs: BMET:  Recent Labs  01/02/14 1135 01/03/14 0352  NA 140 138  K 4.2 3.5*  CL 100 97  CO2 24 25  GLUCOSE 127* 132*  BUN 31* 22  CREATININE 1.42* 1.28*  CALCIUM 9.3 9.5    Liver function tests: No results found for this basename: AST, ALT, ALKPHOS, BILITOT, PROT, ALBUMIN,  in the last 72 hours No results  found for this basename: LIPASE, AMYLASE,  in the last 72 hours  CBC:  Recent Labs  01/02/14 0518 01/03/14 0352  WBC 11.5* 9.8  HGB 13.0 13.3  HCT 38.4 39.6  MCV 80.5 80.5  PLT 302 271    Cardiac Enzymes:  Recent Labs  12/31/13 1120  TROPONINI 0.82*    Coagulation Studies:  Recent Labs  01/02/14 0518  LABPROT 13.0  INR 1.00    Other: No components found with this basename: POCBNP,  No results found for this basename: DDIMER,  in the last 72 hours  Recent Labs  01/01/14 0332  HGBA1C 9.0*    Recent Labs  01/01/14 0332  CHOL 215*  HDL 54  LDLCALC 110*  TRIG 256*  CHOLHDL 4.0   No results found for this basename: TSH, T4TOTAL, FREET3, T3FREE, THYROIDAB,  in the last 72 hours No results found for this basename: VITAMINB12, FOLATE, FERRITIN, TIBC, IRON, RETICCTPCT,  in the last 72 hours   Other results:  EKG :  NSR , no ST or T wave changes.  Medications:    Infusions:    Scheduled Medications: . aspirin  81 mg  Oral Daily  . budesonide  0.5 mg Nebulization BID  . enoxaparin (LOVENOX) injection  40 mg Subcutaneous Q24H  . famotidine  20 mg Oral BID  . FLUoxetine  20 mg Oral Daily  . furosemide  40 mg Oral BID  . insulin aspart  0-20 Units Subcutaneous TID WC  . insulin glargine  40 Units Subcutaneous QHS  . levalbuterol  0.63 mg Nebulization TID  . loratadine  10 mg Oral Daily  . LORazepam  1 mg Oral QHS  . nebivolol  10 mg Oral Daily  . potassium chloride  10 mEq Oral Daily  . potassium chloride  40 mEq Oral Once  . predniSONE  5 mg Oral Daily  . simvastatin  20 mg Oral q1800  . sodium chloride  3 mL Intravenous Q12H  . 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 CAD:  S/p stenting of her diagonal artery.  The plan is to allow the contrast to wash out and do PCI on the RCA on Monday,.  Her creatinine is actually a bit better after hydration.  Will write for precath orders tomorrow.  2.   Disposition:  Length of Stay: 4  Vesta MixerPhilip J. Cristan Scherzer, Montez HagemanJr., MD, [facility] Center[facility] 01/03/2014, 8:50 AM Office [phone] Pager [phone]

## 2014-01-03 NOTE — Progress Notes (Signed)
CARDIAC REHAB PHASE I   PRE:  Rate/Rhythm: 89 SR  BP:  Supine:   Sitting: 122/70  Standing:    SaO2: 98%RA  MODE:  Ambulation: 170 ft   POST:  Rate/Rhythm: 102 ST  BP:  Supine:   Sitting: 133/78  Standing:    SaO2: 98%RA 1125-1154 Pt walked 170 ft on RA holding to siderail and asst x 1. C/o knee needing surgery and that she falls at home at times. Stated does not go outside of home for this reason. Does not do a lot of walking. Stopped once to rest. Would recommend rolling walker next walk. No CP. Wheezing and SOB after walk with sat at 98%. Pt states wheezing not new for her. Gave MI booklet and reviewed restrictions. Discussed importance of brilinta. Pt has booklet. Will complete ed on next visit as lunch here now. Set up lunch.    Luetta Nuttingharlene Cassity Christian, RN BSN  01/03/2014 11:51 AM

## 2014-01-04 DIAGNOSIS — J441 Chronic obstructive pulmonary disease with (acute) exacerbation: Secondary | ICD-10-CM

## 2014-01-04 LAB — GLUCOSE, CAPILLARY
GLUCOSE-CAPILLARY: 160 mg/dL — AB (ref 70–99)
Glucose-Capillary: 179 mg/dL — ABNORMAL HIGH (ref 70–99)
Glucose-Capillary: 173 mg/dL — ABNORMAL HIGH (ref 70–99)
Glucose-Capillary: 198 mg/dL — ABNORMAL HIGH (ref 70–99)

## 2014-01-04 MED ORDER — INSULIN GLARGINE 100 UNIT/ML ~~LOC~~ SOLN
15.0000 [IU] | Freq: Every day | SUBCUTANEOUS | Status: DC
  Administered 2014-01-04 – 2014-01-05 (×2): 15 [IU] via SUBCUTANEOUS
  Filled 2014-01-04 (×3): qty 0.15

## 2014-01-04 MED ORDER — POTASSIUM CHLORIDE CRYS ER 20 MEQ PO TBCR
40.0000 meq | EXTENDED_RELEASE_TABLET | Freq: Once | ORAL | Status: AC
  Administered 2014-01-04: 40 meq via ORAL
  Filled 2014-01-04: qty 2

## 2014-01-04 NOTE — Progress Notes (Signed)
PROGRESS NOTE  Subjective:   [name] Lane is a 73 yo admitted with CP. myoview showed a distal ant. Lat area of ischema.   She was found to have a tight diag stenosis and a tight RCA stenosis on cath.  She has had her diag stented. The plan is for PCI on the RCA on Monday.   Objective:    Vital Signs:   Temp:  [97.7 F (36.5 C)-98.4 F (36.9 C)] 97.7 F (36.5 C) (03/08 0456) Pulse Rate:  [67-89] 67 (03/08 0456) Resp:  [19-20] 20 (03/07 2100) BP: (120-140)/(49-62) 140/52 mmHg (03/08 0456) SpO2:  [92 %-97 %] 97 % (03/08 0913) FiO2 (%):  [21 %] 21 % (03/07 1426) Weight:  [193 lb 8 oz (87.771 kg)] 193 lb 8 oz (87.771 kg) (03/08 0456)  Last BM Date: 01/03/14   24-hour weight change: Weight change:   Weight trends: Filed Weights   01/02/14 0423 01/03/14 1002 01/04/14 0456  Weight: 194 lb 12.8 oz (88.361 kg) 194 lb 14.4 oz (88.406 kg) 193 lb 8 oz (87.771 kg)    Intake/Output:  03/07 0701 - 03/08 0700 In: 600 [P.O.:600] Out: -  Total I/O In: 240 [P.O.:240] Out: -    Physical Exam: BP 140/52  Pulse 67  Temp(Src) 97.7 F (36.5 C) (Oral)  Resp 20  Ht 5\' 3"  (1.6 m)  Wt 193 lb 8 oz (87.771 kg)  BMI 34.29 kg/m2  SpO2 97%  Wt Readings from Last 3 Encounters:  01/04/14 193 lb 8 oz (87.771 kg)  01/04/14 193 lb 8 oz (87.771 kg)  10/01/12 199 lb 12.8 oz (90.629 kg)    General: Vital signs reviewed and noted.   Head: Normocephalic, atraumatic.  Eyes: conjunctivae/corneas clear.  EOM's intact.   Throat: normal  Neck:  normal  Lungs:    clear today  Heart:  RR, normal S1S2  Abdomen:  Soft, non-tender, non-distended    Extremities: Normal, right radial cath site looks ok    Neurologic: A&O X3, CN II - XII are grossly intact.   Psych: Normal     Labs: BMET:  Recent Labs  01/02/14 1135 01/03/14 0352  NA 140 138  K 4.2 3.5*  CL 100 97  CO2 24 25  GLUCOSE 127* 132*  BUN 31* 22  CREATININE 1.42* 1.28*  CALCIUM 9.3 9.5    Liver function tests: No  results found for this basename: AST, ALT, ALKPHOS, BILITOT, PROT, ALBUMIN,  in the last 72 hours No results found for this basename: LIPASE, AMYLASE,  in the last 72 hours  CBC:  Recent Labs  01/02/14 0518 01/03/14 0352  WBC 11.5* 9.8  HGB 13.0 13.3  HCT 38.4 39.6  MCV 80.5 80.5  PLT 302 271    Cardiac Enzymes: No results found for this basename: CKTOTAL, CKMB, TROPONINI,  in the last 72 hours  Coagulation Studies:  Recent Labs  01/02/14 0518  LABPROT 13.0  INR 1.00     Other results:  EKG :  NSR , no ST or T wave changes.  Medications:    Infusions:    Scheduled Medications: . aspirin  81 mg Oral Daily  . budesonide  0.5 mg Nebulization BID  . famotidine  20 mg Oral BID  . FLUoxetine  20 mg Oral Daily  . furosemide  40 mg Oral BID  . insulin aspart  0-20 Units Subcutaneous TID WC  . insulin glargine  15 Units Subcutaneous QHS  . loratadine  10  mg Oral Daily  . LORazepam  1 mg Oral QHS  . nebivolol  10 mg Oral Daily  . potassium chloride  10 mEq Oral Daily  . potassium chloride  40 mEq Oral Once  . predniSONE  5 mg Oral Daily  . simvastatin  20 mg Oral q1800  . sodium chloride  3 mL Intravenous Q12H  . 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 CAD:  S/p stenting of her diagonal artery.  Plan on PCI of RCA tomorrow.    Her creatinine is actually a bit better after hydration.  Will write for precath orders tomorrow.   Disposition:  Length of Stay: 5  [name] MixerPhilip J. Buffi Ewton, [name] HagemanJr., MD,  [facility]ACC 01/04/2014, 11:58 AM Office [phone] Pager [phone]

## 2014-01-04 NOTE — Progress Notes (Signed)
[facility] PROGRESS NOTE  [name] Lane ZOX:[id number] DOB: 06/01/1941 DOA: 12/30/2013 PCP: Lupita DawnOREY,JOHN, MD  Assessment/Plan: 1 N-STEMI;  Patient currently chest pain-free.  troponin came back elevated at 0.82.   Underwent Cardiolite stress test, which is positive. Patient S/P Cath 3-6 and S/P PCI of proximal D1 with a Promus Premier DES. Continue with Brilinta.  Plan to repeat Cath on Monday  for  PCI to proximal RCA.   2 COPD/asthma Continue Pulmicort and nebulizers for now.  3 Type 2 diabetes Hemoglobin A1c is 9.0. Continue home dose of Lantus. Sliding scale insulin. Will decrease lantus dose 3-8 anticipating NPO status for Monday.   4 Hypertension Continue Lasix, bystolic.  5 Hyperlipidemia LDL is 110, total cholesterol 215, TG 256  6 Obstructive sleep apnea Place on CPAP each bedtime.  7 AKI Cr stable post cath.  Monitor on lasix.   8 Prophylaxis Pepcid for GI prophylaxis.  Code Status: Full Family Communication: Updated patient. Disposition Plan: Home when medically stable.   Consultants:  Cardiology: Dr. Myrtis SerKatz 12/30/2013  Procedures:  Chest x-ray 12/30/2013  Cath 3-6- 2015 Severe 2 vessel CAD with 95% proximal D1 lesion -os consistent with the stress test, and therefore the culprit lesion. Also noted is a roughly 80% napkin ring lesion in the proximal RCA just prior to the stent. Lastly there is a 99% ostial stenosis of a small RPL 3.  Successful PCI of proximal D1 with a Promus Premier DES 2.25 mm x 12 mm (2.4 mm postdilated)  Aortic pressure: 1:30/58/81 mmHg; LV pressures 121/7/9 mmHg .   Antibiotics:  None  HPI/Subjective: Patient feeling  Well. No chest pain, no dyspnea.    Objective: Filed Vitals:   01/04/14 0456  BP: 140/52  Pulse: 67  Temp: 97.7 F (36.5 C)  Resp:     Intake/Output Summary (Last 24 hours) at 01/04/14 1113 Last data filed at 01/04/14 0800  Gross per 24 hour  Intake    600 ml  Output      0 ml  Net    600  ml   Filed Weights   01/02/14 0423 01/03/14 1002 01/04/14 0456  Weight: 88.361 kg (194 lb 12.8 oz) 88.406 kg (194 lb 14.4 oz) 87.771 kg (193 lb 8 oz)    Exam:        Physical Exam: Head: Normocephalic, atraumatic.  Eyes: No signs of jaundice, EOMI Nose: Mucous membranes dry.  Throat: Oropharynx nonerythematous, no exudate appreciated.  Neck: supple,No deformities, masses, or tenderness noted. Lungs: Normal respiratory effort. B/L Clear to auscultation, no crackles or wheezes.  Heart: Regular RR. S1 and S2 normal  Abdomen: BS normoactive. Soft, Nondistended, non-tender.  Extremities: No pretibial edema, no erythema   Data Reviewed: Basic Metabolic Panel:  Recent Labs Lab 12/31/13 0520 01/01/14 0332 01/02/14 0518 01/02/14 1135 01/03/14 0352  NA 139 141 138 140 138  K 4.0 3.5* 4.7 4.2 3.5*  CL 99 98 97 100 97  CO2 19 27 24 24 25   GLUCOSE 383* 140* 150* 127* 132*  BUN 16 25* 35* 31* 22  CREATININE 0.99 1.29* 1.61* 1.42* 1.28*  CALCIUM 9.3 9.8 9.5 9.3 9.5   Liver Function Tests:  Recent Labs Lab 12/31/13 0520  AST 16  ALT 13  ALKPHOS 56  BILITOT 0.3  PROT 7.0  ALBUMIN 3.4*   No results found for this basename: LIPASE, AMYLASE,  in the last 168 hours No results found for this basename: AMMONIA,  in the last 168 hours CBC:  Recent Labs Lab 12/30/13 1727 12/30/13 1748 12/31/13 0520 01/01/14 0332 01/02/14 0518 01/03/14 0352  WBC 9.1  --  10.8* 11.9* 11.5* 9.8  NEUTROABS 6.7  --  10.1*  --   --   --   HGB 12.4 13.6 12.4 12.7 13.0 13.3  HCT 37.0 40.0 37.1 37.6 38.4 39.6  MCV 79.9  --  79.6 80.0 80.5 80.5  PLT 249  --  279 278 302 271   Cardiac Enzymes:  Recent Labs Lab 12/30/13 1728 12/31/13 0050 12/31/13 0517 12/31/13 1120  TROPONINI <0.30 <0.30 <0.30 0.82*   BNP (last 3 results)  Recent Labs  12/30/13 1728  PROBNP 320.8*   CBG:  Recent Labs Lab 01/03/14 0838 01/03/14 1113 01/03/14 1631 01/03/14 2202 01/04/14 0509  GLUCAP 153*  201* 177* 143* 179*    Recent Results (from the past 240 hour(s))  URINE CULTURE     Status: None   Collection Time    12/31/13 12:53 PM      Result Value Ref Range Status   Specimen Description URINE, RANDOM   Final   Special Requests NONE   Final   Culture  Setup Time     Final   Value: 12/31/2013 13:29     Performed at [facility] Count     Final   Value: 20,OOO COLONIES/ML     Performed at [facility]   Culture     Final   Value: Multiple bacterial morphotypes present, none predominant. Suggest appropriate recollection if clinically indicated.     Performed at [facility]   Report Status 01/01/2014 FINAL   Final     Studies: No results found.  Scheduled Meds: . aspirin  81 mg Oral Daily  . budesonide  0.5 mg Nebulization BID  . famotidine  20 mg Oral BID  . FLUoxetine  20 mg Oral Daily  . furosemide  40 mg Oral BID  . insulin aspart  0-20 Units Subcutaneous TID WC  . insulin glargine  40 Units Subcutaneous QHS  . loratadine  10 mg Oral Daily  . LORazepam  1 mg Oral QHS  . nebivolol  10 mg Oral Daily  . potassium chloride  10 mEq Oral Daily  . potassium chloride  40 mEq Oral Once  . predniSONE  5 mg Oral Daily  . simvastatin  20 mg Oral q1800  . sodium chloride  3 mL Intravenous Q12H  . Ticagrelor  90 mg Oral BID   Continuous Infusions:    Principal Problem:   NSTEMI (non-ST elevated myocardial infarction) Active Problems:   DIABETES MELLITUS, TYPE II   HYPERCHOLESTEROLEMIA  IIA   Anxiety state, unspecified   HYPERTENSION   CAD - remote LAD and RCA stenting, last cath 12/11- 20% LAD, 40% RCA   Asthma exacerbation   C O P D   OBSTRUCTIVE SLEEP APNEA- non compliant with C-pap   Chest pain with moderate risk of acute coronary syndrome   Dyspnea   Chest pain   Abnormal nuclear stress test    Time spent: 25 min    Blima Rich MD [facility] Pager [phone]. If 7PM-7AM, please contact  night-coverage at [email or web address], password [facility] 01/04/2014, 11:13 AM  LOS: 5 days

## 2014-01-05 ENCOUNTER — Encounter (HOSPITAL_COMMUNITY)
Admission: EM | Disposition: A | Discharge status: Home / Self Care | Payer: Self-pay | Source: Home / Self Care | Attending: Internal Medicine

## 2014-01-05 DIAGNOSIS — R9439 Abnormal result of other cardiovascular function study: Secondary | ICD-10-CM

## 2014-01-05 LAB — CBC
HCT: 38.3 % (ref 36.0–46.0)
Hemoglobin: 12.9 g/dL (ref 12.0–15.0)
MCH: 26.9 pg (ref 26.0–34.0)
MCHC: 33.7 g/dL (ref 30.0–36.0)
MCV: 80 fL (ref 78.0–100.0)
PLATELETS: 276 10*3/uL (ref 150–400)
RBC: 4.79 MIL/uL (ref 3.87–5.11)
RDW: 14.6 % (ref 11.5–15.5)
WBC: 10.7 10*3/uL — AB (ref 4.0–10.5)

## 2014-01-05 LAB — BASIC METABOLIC PANEL
BUN: 33 mg/dL — AB (ref 6–23)
CALCIUM: 9.5 mg/dL (ref 8.4–10.5)
CO2: 25 mEq/L (ref 19–32)
CREATININE: 1.77 mg/dL — AB (ref 0.50–1.10)
Chloride: 97 mEq/L (ref 96–112)
GFR, EST AFRICAN AMERICAN: 31 mL/min — AB (ref >90)
GFR, EST NON AFRICAN AMERICAN: 27 mL/min — AB (ref >90)
Glucose, Bld: 173 mg/dL — ABNORMAL HIGH (ref 70–99)
Potassium: 3.9 mEq/L (ref 3.7–5.3)
Sodium: 138 mEq/L (ref 137–147)

## 2014-01-05 LAB — GLUCOSE, CAPILLARY
GLUCOSE-CAPILLARY: 187 mg/dL — AB (ref 70–99)
GLUCOSE-CAPILLARY: 241 mg/dL — AB (ref 70–99)
Glucose-Capillary: 219 mg/dL — ABNORMAL HIGH (ref 70–99)

## 2014-01-05 SURGERY — PERCUTANEOUS CORONARY STENT INTERVENTION (PCI-S)
Anesthesia: LOCAL

## 2014-01-05 MED ORDER — SODIUM CHLORIDE 0.9 % IJ SOLN: 3.0000 mL | INTRAMUSCULAR | Status: DC | PRN

## 2014-01-05 MED ORDER — ASPIRIN 81 MG PO CHEW
81.0000 mg | CHEWABLE_TABLET | ORAL | Status: AC
  Administered 2014-01-05: 81 mg via ORAL
  Filled 2014-01-05: qty 1

## 2014-01-05 MED ORDER — SODIUM CHLORIDE 0.9 % IV SOLN
INTRAVENOUS | Status: DC
  Administered 2014-01-05: 04:00:00 via INTRAVENOUS

## 2014-01-05 MED ORDER — SODIUM CHLORIDE 0.9 % IV SOLN: INTRAVENOUS | Status: DC

## 2014-01-05 MED ORDER — SODIUM CHLORIDE 0.9 % IV SOLN: 250.0000 mL | INTRAVENOUS | Status: DC | PRN

## 2014-01-05 MED ORDER — SODIUM CHLORIDE 0.9 % IJ SOLN: 3.0000 mL | Freq: Two times a day (BID) | INTRAMUSCULAR | Status: DC

## 2014-01-05 NOTE — Progress Notes (Signed)
CARDIAC REHAB PHASE I   PRE:  Rate/Rhythm: 83SR  BP:  Supine:   Sitting: 120/80  Standing:    SaO2: 98%RA  MODE:  Ambulation: 200 ft   POST:  Rate/Rhythm: 91  BP:  Supine:   Sitting: 130/72  Standing:    SaO2: 98%RA 0930-1008 Pt walked 200 ft on RA with rolling walker with steady gait. Pt steadier with walker and needs one for home as she stated she sometimes falls at home. Also discussed with pt life alert or life line if she is by herself at times. Pt did c/o back and knee pain during walk but denied CP. No audible wheezing with this walk as was present with walk Saturday. Right arm red and painful at old IV site. Showed site to MD. Reviewed NTG use with pt and MI restrictions. Left heart healthy and diabetic diets as pt's breakfast here. Will review on other visits.   Luetta Nuttingharlene Carlota Philley, RN BSN  01/05/2014 10:04 AM

## 2014-01-05 NOTE — Progress Notes (Signed)
CPAP is setup at bedside. Pt states that she has one identical to it at home and she is able to put herself on ours here. RT explained if she felt she needed help to please let RN know to call RT.

## 2014-01-05 NOTE — Progress Notes (Signed)
PROGRESS NOTE  Subjective:   [name] is a 73 yo admitted with CP. myoview showed a distal ant. Lat area of ischema.   She was found to have a tight diag stenosis and a tight RCA stenosis on cath.  She has had her diag stented. The plan was  for PCI on the RCA today but her creatinine has increased.  Will hydrate and attempt to do tomorrow.    Objective:    Vital Signs:   Temp:  [98.4 F (36.9 C)-98.6 F (37 C)] 98.4 F (36.9 C) (03/09 0525) Pulse Rate:  [81-86] 85 (03/09 0805) Resp:  [17-20] 18 (03/09 0805) BP: (114-124)/(57-71) 124/71 mmHg (03/09 0827) SpO2:  [97 %-98 %] 98 % (03/09 0805) Weight:  [194 lb 0.1 oz (88 kg)] 194 lb 0.1 oz (88 kg) (03/09 0525)  Last BM Date: 01/03/14   24-hour weight change: Weight change: -14.3 oz (-0.406 kg)  Weight trends: Filed Weights   01/03/14 1002 01/04/14 0456 01/05/14 0525  Weight: 194 lb 14.4 oz (88.406 kg) 193 lb 8 oz (87.771 kg) 194 lb 0.1 oz (88 kg)    Intake/Output:  03/08 0701 - 03/09 0700 In: 480 [P.O.:480] Out: 350 [Urine:350]     Physical Exam: BP 124/71  Pulse 85  Temp(Src) 98.4 F (36.9 C) (Oral)  Resp 18  Ht 5\' 3"  (1.6 m)  Wt 194 lb 0.1 oz (88 kg)  BMI 34.38 kg/m2  SpO2 98%  Wt Readings from Last 3 Encounters:  01/05/14 194 lb 0.1 oz (88 kg)  01/05/14 194 lb 0.1 oz (88 kg)  01/05/14 194 lb 0.1 oz (88 kg)    General: Vital signs reviewed and noted.   Head: Normocephalic, atraumatic.  Eyes: conjunctivae/corneas clear.  EOM's intact.   Throat: normal  Neck:  normal  Lungs:    clear today  Heart:  RR, normal S1S2  Abdomen:  Soft, non-tender, non-distended    Extremities: Normal, right radial cath site looks ok    Neurologic: A&O X3, CN II - XII are grossly intact.   Psych: Normal     Labs: BMET:  Recent Labs  01/03/14 0352 01/05/14 0712  NA 138 138  K 3.5* 3.9  CL 97 97  CO2 25 25  GLUCOSE 132* 173*  BUN 22 33*  CREATININE 1.28* 1.77*  CALCIUM 9.5 9.5    Liver function  tests: No results found for this basename: AST, ALT, ALKPHOS, BILITOT, PROT, ALBUMIN,  in the last 72 hours No results found for this basename: LIPASE, AMYLASE,  in the last 72 hours  CBC:  Recent Labs  01/03/14 0352 01/05/14 0712  WBC 9.8 10.7*  HGB 13.3 12.9  HCT 39.6 38.3  MCV 80.5 80.0  PLT 271 276    Cardiac Enzymes: No results found for this basename: CKTOTAL, CKMB, TROPONINI,  in the last 72 hours  Coagulation Studies: No results found for this basename: LABPROT, INR,  in the last 72 hours   Other results:  EKG :  NSR , no ST or T wave changes.  Medications:    Infusions: . sodium chloride 75 mL/hr at 01/05/14 0413    Scheduled Medications: . aspirin  81 mg Oral Daily  . budesonide  0.5 mg Nebulization BID  . famotidine  20 mg Oral BID  . FLUoxetine  20 mg Oral Daily  . furosemide  40 mg Oral BID  . insulin aspart  0-20 Units Subcutaneous TID WC  . insulin glargine  15 Units Subcutaneous QHS  . loratadine  10 mg Oral Daily  . LORazepam  1 mg Oral QHS  . nebivolol  10 mg Oral Daily  . potassium chloride  10 mEq Oral Daily  . predniSONE  5 mg Oral Daily  . simvastatin  20 mg Oral q1800  . sodium chloride  3 mL Intravenous Q12H  . sodium chloride  3 mL Intravenous Q12H  . 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 CAD:  S/p stenting of her diagonal artery.  Plan on PCI of RCA tomorrow.   Will start hydration.  No CP   she may eat today   2. CKD:  Will hydrate, anticipate cath  tomorrow.   Disposition:  Length of Stay: 6  Vesta Mixer, Montez Hageman., MD, [facility] 01/05/2014, 9:11 AM Office [phone] Pager [phone]

## 2014-01-05 NOTE — Progress Notes (Signed)
[facility] PROGRESS NOTE  [name] Lane ZOX:[id number] DOB: [birth year] DOA: 12/30/2013 PCP: Lupita Dawn, MD  Assessment/Plan: 1 N-STEMI;  Patient currently chest pain-free.  troponin came back elevated at 0.82.   Underwent Cardiolite stress test, which is positive. Patient S/P Cath 3-6 and S/P PCI of proximal D1 with Lane Promus Premier DES. Continue with Brilinta.  Plan to repeat Cath 3-10  for  PCI to proximal RCA if renal function improved.   Hold lasix.  IV fluids.   2 COPD/asthma Continue Pulmicort and nebulizers for now.  3 Type 2 diabetes Hemoglobin A1c is 9.0. Continue home dose of Lantus. Sliding scale insulin. Continue with decrease dose lantus anticipating NPO after midnight.   4 Hypertension Continue bystolic.  5 Hyperlipidemia LDL is 110, total cholesterol 215, TG 256  6 Obstructive sleep apnea Place on CPAP each bedtime.  7 AKI Cr increase to 1.7 today. Hold lasix. IV fluids. Cath tomorrow if renal function improved.   8 Prophylaxis Pepcid for GI prophylaxis.  Code Status: Full Family Communication: Updated patient. Disposition Plan: Home when medically stable.   Consultants:  Cardiology: Dr. Myrtis Ser 12/30/2013  Procedures:  Chest x-ray 12/30/2013  Cath 3-6- 2015 Severe 2 vessel CAD with 95% proximal D1 lesion -os consistent with the stress test, and therefore the culprit lesion. Also noted is Lane roughly 80% napkin ring lesion in the proximal RCA just prior to the stent. Lastly there is Lane 99% ostial stenosis of Lane small RPL 3.  Successful PCI of proximal D1 with Lane Promus Premier DES 2.25 mm x 12 mm (2.4 mm postdilated)  Aortic pressure: 1:30/58/81 mmHg; LV pressures 121/7/9 mmHg .   Antibiotics:  None  HPI/Subjective: Patient feeling  Well. No chest pain, no dyspnea. Walking on the hall, mild back pain.    Objective: Filed Vitals:   01/05/14 0827  BP: 124/71  Pulse:   Temp:   Resp:     Intake/Output Summary (Last 24 hours) at  01/05/14 0944 Last data filed at 01/05/14 0527  Gross per 24 hour  Intake    240 ml  Output    350 ml  Net   -110 ml   Filed Weights   01/03/14 1002 01/04/14 0456 01/05/14 0525  Weight: 88.406 kg (194 lb 14.4 oz) 87.771 kg (193 lb 8 oz) 88 kg (194 lb 0.1 oz)    Exam:        Physical Exam: Head: Normocephalic, atraumatic.  Eyes: No signs of jaundice, EOMI Nose: Mucous membranes dry.  Throat: Oropharynx nonerythematous, no exudate appreciated.  Neck: supple,No deformities, masses, or tenderness noted. Lungs: Normal respiratory effort. B/L Clear to auscultation, no crackles or wheezes.  Heart: Regular RR. S1 and S2 normal  Abdomen: BS normoactive. Soft, Nondistended, non-tender.  Extremities: No pretibial edema, no erythema   Data Reviewed: Basic Metabolic Panel:  Recent Labs Lab 01/01/14 0332 01/02/14 0518 01/02/14 1135 01/03/14 0352 01/05/14 0712  NA 141 138 140 138 138  K 3.5* 4.7 4.2 3.5* 3.9  CL 98 97 100 97 97  CO2 27 24 24 25 25   GLUCOSE 140* 150* 127* 132* 173*  BUN 25* 35* 31* 22 33*  CREATININE 1.29* 1.61* 1.42* 1.28* 1.77*  CALCIUM 9.8 9.5 9.3 9.5 9.5   Liver Function Tests:  Recent Labs Lab 12/31/13 0520  AST 16  ALT 13  ALKPHOS 56  BILITOT 0.3  PROT 7.0  ALBUMIN 3.4*   No results found for this basename: LIPASE, AMYLASE,  in the last 168  hours No results found for this basename: AMMONIA,  in the last 168 hours CBC:  Recent Labs Lab 12/30/13 1727  12/31/13 0520 01/01/14 0332 01/02/14 0518 01/03/14 0352 01/05/14 0712  WBC 9.1  --  10.8* 11.9* 11.5* 9.8 10.7*  NEUTROABS 6.7  --  10.1*  --   --   --   --   HGB 12.4  < > 12.4 12.7 13.0 13.3 12.9  HCT 37.0  < > 37.1 37.6 38.4 39.6 38.3  MCV 79.9  --  79.6 80.0 80.5 80.5 80.0  PLT 249  --  279 278 302 271 276  < > = values in this interval not displayed. Cardiac Enzymes:  Recent Labs Lab 12/30/13 1728 12/31/13 0050 12/31/13 0517 12/31/13 1120  TROPONINI <0.30 <0.30 <0.30 0.82*    BNP (last 3 results)  Recent Labs  12/30/13 1728  PROBNP 320.8*   CBG:  Recent Labs Lab 01/04/14 0509 01/04/14 1129 01/04/14 1631 01/04/14 2208 01/05/14 0627  GLUCAP 179* 173* 198* 160* 187*    Recent Results (from the past 240 hour(s))  URINE CULTURE     Status: None   Collection Time    12/31/13 12:53 PM      Result Value Ref Range Status   Specimen Description URINE, RANDOM   Final   Special Requests NONE   Final   Culture  Setup Time     Final   Value: 12/31/2013 13:29     Performed at [facility]   Colony Count     Final   Value: 20,OOO COLONIES/ML     Performed at [facility]   Culture     Final   Value: Multiple bacterial morphotypes present, none predominant. Suggest appropriate recollection if clinically indicated.     Performed at [facility]   Report Status 01/01/2014 FINAL   Final     Studies: No results found.  Scheduled Meds: . aspirin  81 mg Oral Daily  . budesonide  0.5 mg Nebulization BID  . famotidine  20 mg Oral BID  . FLUoxetine  20 mg Oral Daily  . insulin aspart  0-20 Units Subcutaneous TID WC  . insulin glargine  15 Units Subcutaneous QHS  . loratadine  10 mg Oral Daily  . LORazepam  1 mg Oral QHS  . nebivolol  10 mg Oral Daily  . predniSONE  5 mg Oral Daily  . simvastatin  20 mg Oral q1800  . Ticagrelor  90 mg Oral BID   Continuous Infusions: . sodium chloride 50 mL/hr at 01/05/14 [id number]    Principal Problem:   NSTEMI (non-ST elevated myocardial infarction) Active Problems:   DIABETES MELLITUS, TYPE II   HYPERCHOLESTEROLEMIA  IIA   Anxiety state, unspecified   HYPERTENSION   CAD - remote LAD and RCA stenting, last cath 12/11- 20% LAD, 40% RCA   Asthma exacerbation   C O P D   OBSTRUCTIVE SLEEP APNEA- non compliant with C-pap   Chest pain with moderate risk of acute coronary syndrome   Dyspnea   Chest pain   Abnormal nuclear stress test    Time spent: 25 min    [name] Lane, [name] Lane  Regalado MD [facility] Pager [phone]. If 7PM-7AM, please contact night-coverage at [email or web address], password [email or web address] 01/05/2014, 9:44 AM  LOS: 6 days

## 2014-01-05 NOTE — Progress Notes (Signed)
Warm compress applied to right upper arm to area of redness and hardness from previous IV, will continue to monitor Archie BalboaStein, Aamani Moose G, RN

## 2014-01-06 ENCOUNTER — Encounter (HOSPITAL_COMMUNITY): Payer: Self-pay | Admitting: Cardiology

## 2014-01-06 ENCOUNTER — Encounter (HOSPITAL_COMMUNITY)
Admission: EM | Disposition: A | Discharge status: Home / Self Care | Payer: Medicare Other | Source: Home / Self Care | Attending: Internal Medicine

## 2014-01-06 DIAGNOSIS — N189 Chronic kidney disease, unspecified: Secondary | ICD-10-CM

## 2014-01-06 DIAGNOSIS — N183 Chronic kidney disease, stage 3 unspecified: Secondary | ICD-10-CM | POA: Diagnosis not present

## 2014-01-06 DIAGNOSIS — I2510 Atherosclerotic heart disease of native coronary artery without angina pectoris: Secondary | ICD-10-CM

## 2014-01-06 DIAGNOSIS — Z955 Presence of coronary angioplasty implant and graft: Secondary | ICD-10-CM

## 2014-01-06 DIAGNOSIS — L0291 Cutaneous abscess, unspecified: Secondary | ICD-10-CM

## 2014-01-06 DIAGNOSIS — L0390 Cellulitis, unspecified: Secondary | ICD-10-CM

## 2014-01-06 HISTORY — PX: PERCUTANEOUS CORONARY STENT INTERVENTION (PCI-S): SHX5485

## 2014-01-06 LAB — BASIC METABOLIC PANEL
BUN: 29 mg/dL — ABNORMAL HIGH (ref 6–23)
CALCIUM: 9.2 mg/dL (ref 8.4–10.5)
CO2: 23 mEq/L (ref 19–32)
CREATININE: 1.46 mg/dL — AB (ref 0.50–1.10)
Chloride: 101 mEq/L (ref 96–112)
GFR calc non Af Amer: 34 mL/min — ABNORMAL LOW (ref >90)
GFR, EST AFRICAN AMERICAN: 40 mL/min — AB (ref >90)
Glucose, Bld: 137 mg/dL — ABNORMAL HIGH (ref 70–99)
Potassium: 3.8 mEq/L (ref 3.7–5.3)
Sodium: 139 mEq/L (ref 137–147)

## 2014-01-06 LAB — GLUCOSE, CAPILLARY
GLUCOSE-CAPILLARY: 144 mg/dL — AB (ref 70–99)
Glucose-Capillary: 157 mg/dL — ABNORMAL HIGH (ref 70–99)
Glucose-Capillary: 177 mg/dL — ABNORMAL HIGH (ref 70–99)
Glucose-Capillary: 133 mg/dL — ABNORMAL HIGH (ref 70–99)
Glucose-Capillary: 144 mg/dL — ABNORMAL HIGH (ref 70–99)

## 2014-01-06 LAB — CBC
HCT: 34.9 % — ABNORMAL LOW (ref 36.0–46.0)
Hemoglobin: 11.8 g/dL — ABNORMAL LOW (ref 12.0–15.0)
MCH: 27.3 pg (ref 26.0–34.0)
MCHC: 33.8 g/dL (ref 30.0–36.0)
MCV: 80.8 fL (ref 78.0–100.0)
PLATELETS: 261 10*3/uL (ref 150–400)
RBC: 4.32 MIL/uL (ref 3.87–5.11)
RDW: 14.5 % (ref 11.5–15.5)
WBC: 10.5 10*3/uL (ref 4.0–10.5)

## 2014-01-06 LAB — POCT ACTIVATED CLOTTING TIME
ACTIVATED CLOTTING TIME: 232 s
ACTIVATED CLOTTING TIME: 271 s

## 2014-01-06 SURGERY — PERCUTANEOUS CORONARY STENT INTERVENTION (PCI-S)
Anesthesia: LOCAL

## 2014-01-06 MED ORDER — VERAPAMIL HCL 2.5 MG/ML IV SOLN
INTRAVENOUS | Status: AC
  Filled 2014-01-06: qty 2

## 2014-01-06 MED ORDER — CEPHALEXIN 250 MG/5ML PO SUSR
250.0000 mg | Freq: Four times a day (QID) | ORAL | Status: DC
  Administered 2014-01-06 – 2014-01-07 (×5): 250 mg via ORAL
  Filled 2014-01-06 (×11): qty 5

## 2014-01-06 MED ORDER — MIDAZOLAM HCL 2 MG/2ML IJ SOLN
INTRAMUSCULAR | Status: AC
  Filled 2014-01-06: qty 2

## 2014-01-06 MED ORDER — INSULIN GLARGINE 100 UNIT/ML ~~LOC~~ SOLN
20.0000 [IU] | Freq: Every day | SUBCUTANEOUS | Status: DC
Start: 2014-01-06 — End: 2014-01-07
  Administered 2014-01-06: 22:00:00 20 [IU] via SUBCUTANEOUS
  Filled 2014-01-06 (×3): qty 0.2

## 2014-01-06 MED ORDER — ASPIRIN 81 MG PO CHEW
81.0000 mg | CHEWABLE_TABLET | ORAL | Status: AC
  Administered 2014-01-06: 81 mg via ORAL

## 2014-01-06 MED ORDER — SODIUM CHLORIDE 0.9 % IJ SOLN: 3.0000 mL | INTRAMUSCULAR | Status: DC | PRN

## 2014-01-06 MED ORDER — FENTANYL CITRATE 0.05 MG/ML IJ SOLN
INTRAMUSCULAR | Status: AC
  Filled 2014-01-06: qty 2

## 2014-01-06 MED ORDER — HEPARIN (PORCINE) IN NACL 2-0.9 UNIT/ML-% IJ SOLN
INTRAMUSCULAR | Status: AC
  Filled 2014-01-06: qty 1500

## 2014-01-06 MED ORDER — SODIUM CHLORIDE 0.9 % IV SOLN: 250.0000 mL | INTRAVENOUS | Status: DC | PRN

## 2014-01-06 MED ORDER — INSULIN GLARGINE 100 UNIT/ML ~~LOC~~ SOLN
30.0000 [IU] | Freq: Every day | SUBCUTANEOUS | Status: DC
  Filled 2014-01-06: qty 0.3

## 2014-01-06 MED ORDER — SODIUM CHLORIDE 0.9 % IV SOLN: 1.0000 mL/kg/h | INTRAVENOUS | Status: AC

## 2014-01-06 MED ORDER — SODIUM CHLORIDE 0.9 % IJ SOLN: 3.0000 mL | Freq: Two times a day (BID) | INTRAMUSCULAR | Status: DC

## 2014-01-06 MED ORDER — LIDOCAINE HCL (PF) 1 % IJ SOLN
INTRAMUSCULAR | Status: AC
  Filled 2014-01-06: qty 30

## 2014-01-06 MED ORDER — HEPARIN SODIUM (PORCINE) 1000 UNIT/ML IJ SOLN
INTRAMUSCULAR | Status: AC
  Filled 2014-01-06: qty 1

## 2014-01-06 NOTE — Interval H&P Note (Signed)
History and Physical Interval Note:  01/06/2014 4:13 PM  [name] Lane  has presented today for surgery, with the diagnosis of recent non-STEMI planned staged PCI.  She was found to have 95% diagonal lesion as well as 80% RCA lesion. D2 desire to conserve contrast, single-vessel PCI only was performed to be more severe severe diagonal branch. The initial intent was for her to be hydrated of the weekend followed by PCI to the RCA. This PCI was delayed from yesterday due to busy schedule.  The various methods of treatment have been discussed with the patient and family. After consideration of risks, benefits and other options for treatment, the patient has consented to  Procedure(s): PERCUTANEOUS CORONARY STENT INTERVENTION (PCI-S) (N/A) as a surgical intervention .  The patient's history has been reviewed, patient examined, no change in status, stable for surgery.  I have reviewed the patient's chart and labs.  Questions were answered to the patient's satisfaction.     [name] Lane W  Cath Lab Visit (complete for each Cath Lab visit)  Clinical Evaluation Leading to the Procedure:   ACS: yes  Non-ACS:    Anginal Classification: CCS I  Anti-ischemic medical therapy: Maximal Therapy (2 or more classes of medications)  Non-Invasive Test Results: Intermediate-risk stress test findings: cardiac mortality 1-3%/year  Prior CABG: No previous CABG

## 2014-01-06 NOTE — Progress Notes (Addendum)
[facility] PROGRESS NOTE  [name] Lane ZOX:[id number] DOB: 1941/09/02 DOA: 12/30/2013 PCP: Lupita Dawn, MD  Assessment/Plan: 73 year old female history of CAD status post stenting, COPD/asthma, OSA noncompliant, hyperlipidemia, diabetes mellitus presents to the ER with complaints of chest pain. Patient found to have NSTEMi. Troponin increased to 0.8. She underwent cardia cath 3-6 and subsequently  PCI of proximal D1 with a Promus Premier DES. She was started on Brilinta. Plan is for repeat Cath 3-11 for  PCI to proximal RCA.   1 N-STEMI;  Patient currently chest pain-free.  troponin came back elevated at 0.82.   Underwent Cardiolite stress test, which is positive. Patient S/P Cath 3-6 and S/P PCI of proximal D1 with a Promus Premier DES. Continue with Brilinta.  Plan to repeat Cath 3-10  for  PCI to proximal RCA.  Hold lasix.  IV fluids. Needs to repeat renal function in am.   2 COPD/asthma Continue Pulmicort and nebulizers for now.  3 Type 2 diabetes Hemoglobin A1c is 9.0. Continue home dose of Lantus. Sliding scale insulin. Increase lantus to 20 units.   4 Hypertension Continue bystolic.  5 Hyperlipidemia LDL is 110, total cholesterol 215, TG 256  6 Obstructive sleep apnea Place on CPAP each bedtime.  7 AKI Cr increase to 1.7 on 3-09. Hold lasix. IV fluids. Cath today. Repeat renal function in am.   8 Prophylaxis Pepcid for GI prophylaxis.  9-Right arm edema; on prior IV access site. Started on keflex by cardio.   Code Status: Full Family Communication: Updated patient. Disposition Plan: Home when medically stable.   Consultants:  Cardiology: Dr. Myrtis Ser 12/30/2013  Procedures:  Chest x-ray 12/30/2013  Cath 3-6- 2015 Severe 2 vessel CAD with 95% proximal D1 lesion -os consistent with the stress test, and therefore the culprit lesion. Also noted is a roughly 80% napkin ring lesion in the proximal RCA just prior to the stent. Lastly there is a 99% ostial  stenosis of a small RPL 3.  Successful PCI of proximal D1 with a Promus Premier DES 2.25 mm x 12 mm (2.4 mm postdilated)  Aortic pressure: 1:30/58/81 mmHg; LV pressures 121/7/9 mmHg .   Antibiotics:  None  HPI/Subjective: Patient feeling  Well. No chest pain, no dyspnea.    Objective: Filed Vitals:   01/06/14 0420  BP: 133/61  Pulse: 76  Temp: 98.1 F (36.7 C)  Resp: 18    Intake/Output Summary (Last 24 hours) at 01/06/14 1300 Last data filed at 01/06/14 0900  Gross per 24 hour  Intake    120 ml  Output    800 ml  Net   -680 ml   Filed Weights   01/03/14 1002 01/04/14 0456 01/05/14 0525  Weight: 88.406 kg (194 lb 14.4 oz) 87.771 kg (193 lb 8 oz) 88 kg (194 lb 0.1 oz)    Exam:        Physical Exam: Head: Normocephalic, atraumatic.  Eyes: No signs of jaundice, EOMI Nose: Mucous membranes dry.  Throat: Oropharynx nonerythematous, no exudate appreciated.  Neck: supple,No deformities, masses, or tenderness noted. Lungs: Normal respiratory effort. B/L Clear to auscultation, no crackles or wheezes.  Heart: Regular RR. S1 and S2 normal  Abdomen: BS normoactive. Soft, Nondistended, non-tender.  Extremities: No pretibial edema, no erythema   Data Reviewed: Basic Metabolic Panel:  Recent Labs Lab 01/02/14 0518 01/02/14 1135 01/03/14 0352 01/05/14 0712 01/06/14 0825  NA 138 140 138 138 139  K 4.7 4.2 3.5* 3.9 3.8  CL 97 100 97  97 101  CO2 24 24 25 25 23   GLUCOSE 150* 127* 132* 173* 137*  BUN 35* 31* 22 33* 29*  CREATININE 1.61* 1.42* 1.28* 1.77* 1.46*  CALCIUM 9.5 9.3 9.5 9.5 9.2   Liver Function Tests:  Recent Labs Lab 12/31/13 0520  AST 16  ALT 13  ALKPHOS 56  BILITOT 0.3  PROT 7.0  ALBUMIN 3.4*   No results found for this basename: LIPASE, AMYLASE,  in the last 168 hours No results found for this basename: AMMONIA,  in the last 168 hours CBC:  Recent Labs Lab 12/30/13 1727  12/31/13 0520 01/01/14 0332 01/02/14 0518 01/03/14 0352  01/05/14 0712 01/06/14 0428  WBC 9.1  --  10.8* 11.9* 11.5* 9.8 10.7* 10.5  NEUTROABS 6.7  --  10.1*  --   --   --   --   --   HGB 12.4  < > 12.4 12.7 13.0 13.3 12.9 11.8*  HCT 37.0  < > 37.1 37.6 38.4 39.6 38.3 34.9*  MCV 79.9  --  79.6 80.0 80.5 80.5 80.0 80.8  PLT 249  --  279 278 302 271 276 261  < > = values in this interval not displayed. Cardiac Enzymes:  Recent Labs Lab 12/30/13 1728 12/31/13 0050 12/31/13 0517 12/31/13 1120  TROPONINI <0.30 <0.30 <0.30 0.82*   BNP (last 3 results)  Recent Labs  12/30/13 1728  PROBNP 320.8*   CBG:  Recent Labs Lab 01/05/14 1109 01/05/14 1657 01/05/14 2055 01/06/14 0634 01/06/14 1159  GLUCAP 241* 219* 144* 157* 177*    Recent Results (from the past 240 hour(s))  URINE CULTURE     Status: None   Collection Time    12/31/13 12:53 PM      Result Value Ref Range Status   Specimen Description URINE, RANDOM   Final   Special Requests NONE   Final   Culture  Setup Time     Final   Value: 12/31/2013 13:29     Performed at [facility]   Colony Count     Final   Value: 20,OOO COLONIES/ML     Performed at [facility]   Culture     Final   Value: Multiple bacterial morphotypes present, none predominant. Suggest appropriate recollection if clinically indicated.     Performed at [facility]   Report Status 01/01/2014 FINAL   Final     Studies: No results found.  Scheduled Meds: . aspirin  81 mg Oral Daily  . budesonide  0.5 mg Nebulization BID  . cephALEXin  250 mg Oral 4 times per day  . famotidine  20 mg Oral BID  . FLUoxetine  20 mg Oral Daily  . insulin aspart  0-20 Units Subcutaneous TID WC  . insulin glargine  15 Units Subcutaneous QHS  . loratadine  10 mg Oral Daily  . LORazepam  1 mg Oral QHS  . nebivolol  10 mg Oral Daily  . predniSONE  5 mg Oral Daily  . simvastatin  20 mg Oral q1800  . sodium chloride  3 mL Intravenous Q12H  . Ticagrelor  90 mg Oral BID   Continuous  Infusions: . sodium chloride 50 mL/hr at 01/05/14 [id number]    Principal Problem:   NSTEMI (non-ST elevated myocardial infarction) Active Problems:   DIABETES MELLITUS, TYPE II   HYPERCHOLESTEROLEMIA  IIA   Anxiety state, unspecified   HYPERTENSION   CAD - remote LAD and RCA stenting, last cath 12/11- 20%  LAD, 40% RCA   Asthma exacerbation   C O P D   OBSTRUCTIVE SLEEP APNEA- non compliant with C-pap   Chest pain with moderate risk of acute coronary syndrome   Dyspnea   Chest pain   Abnormal nuclear stress test   CKD (chronic kidney disease) stage 4, GFR 15-29 ml/min, post cath   S/P coronary artery stent placement 01/02/14 stent to diag.Promus Premier     Time spent: 25 min    Blima Rich MD [facility] Pager [phone]. If 7PM-7AM, please contact night-coverage at [email or web address], password [facility] 01/06/2014, 1:00 PM  LOS: 7 days

## 2014-01-06 NOTE — Progress Notes (Signed)
Warm compress applied to right brachial site, will continue to monitor, pt tolerated Archie BalboaStein, Uriyah Massimo G, RN

## 2014-01-06 NOTE — Progress Notes (Signed)
BP by right leg.

## 2014-01-06 NOTE — H&P (View-Only) (Signed)
    PROGRESS NOTE  Subjective:   [name] is a 73 yo admitted with CP. myoview showed a distal ant. Lat area of ischema.   She was found to have a tight diag stenosis and a tight RCA stenosis on cath.  She has had her diag stented. The plan was  for PCI on the RCA today but her creatinine has increased.  BMP is pending   Objective:    Vital Signs:   Temp:  [98.1 F (36.7 C)-98.7 F (37.1 C)] 98.1 F (36.7 C) (03/10 0420) Pulse Rate:  [76-86] 76 (03/10 0420) Resp:  [18] 18 (03/10 0420) BP: (110-134)/(58-71) 133/61 mmHg (03/10 0420) SpO2:  [97 %-99 %] 99 % (03/10 0728)  Last BM Date: 01/05/14   24-hour weight change: Weight change:   Weight trends: Filed Weights   01/03/14 1002 01/04/14 0456 01/05/14 0525  Weight: 194 lb 14.4 oz (88.406 kg) 193 lb 8 oz (87.771 kg) 194 lb 0.1 oz (88 kg)    Intake/Output:  03/09 0701 - 03/10 0700 In: 600 [P.O.:600] Out: 800 [Urine:800]     Physical Exam: BP 133/61  Pulse 76  Temp(Src) 98.1 F (36.7 C) (Oral)  Resp 18  Ht 5' 3" (1.6 m)  Wt 194 lb 0.1 oz (88 kg)  BMI 34.38 kg/m2  SpO2 99%  Wt Readings from Last 3 Encounters:  01/05/14 194 lb 0.1 oz (88 kg)  01/05/14 194 lb 0.1 oz (88 kg)  01/05/14 194 lb 0.1 oz (88 kg)    General: Vital signs reviewed and noted.   Head: Normocephalic, atraumatic.  Eyes: conjunctivae/corneas clear.  EOM's intact.   Throat: normal  Neck:  normal  Lungs:  clear today  Heart:  RR, normal S1 S2  Abdomen:  Soft, non-tender, non-distended    Extremities: Normal, right radial cath site looks ok  she has an area of mild cellulitis associated with an old IV site in her right brachial area.  Warm to the touch. No drainage  Neurologic: A&O X3, CN II - XII are grossly intact.   Psych: Normal     Labs: BMET:  Recent Labs  01/05/14 0712  NA 138  K 3.9  CL 97  CO2 25  GLUCOSE 173*  BUN 33*  CREATININE 1.77*  CALCIUM 9.5    Liver function tests: No results found for this basename:  AST, ALT, ALKPHOS, BILITOT, PROT, ALBUMIN,  in the last 72 hours No results found for this basename: LIPASE, AMYLASE,  in the last 72 hours  CBC:  Recent Labs  01/05/14 0712 01/06/14 0428  WBC 10.7* 10.5  HGB 12.9 11.8*  HCT 38.3 34.9*  MCV 80.0 80.8  PLT 276 261    Cardiac Enzymes: No results found for this basename: CKTOTAL, CKMB, TROPONINI,  in the last 72 hours  Coagulation Studies: No results found for this basename: LABPROT, INR,  in the last 72 hours   Other results:  EKG :  NSR , no ST or T wave changes.  Medications:    Infusions: . sodium chloride 50 mL/hr at 01/05/14 0918    Scheduled Medications: . aspirin  81 mg Oral Daily  . budesonide  0.5 mg Nebulization BID  . famotidine  20 mg Oral BID  . FLUoxetine  20 mg Oral Daily  . insulin aspart  0-20 Units Subcutaneous TID WC  . insulin glargine  15 Units Subcutaneous QHS  . loratadine  10 mg Oral Daily  . LORazepam  1 mg   Oral QHS  . nebivolol  10 mg Oral Daily  . predniSONE  5 mg Oral Daily  . simvastatin  20 mg Oral q1800  . Ticagrelor  90 mg Oral BID    Assessment/ Plan:   Principal Problem:   NSTEMI (non-ST elevated myocardial infarction) Active Problems:   DIABETES MELLITUS, TYPE II   HYPERCHOLESTEROLEMIA  IIA   Anxiety state, unspecified   HYPERTENSION   CAD - remote LAD and RCA stenting, last cath 12/11- 20% LAD, 40% RCA   Asthma exacerbation   C O P D   OBSTRUCTIVE SLEEP APNEA- non compliant with C-pap   Chest pain with moderate risk of acute coronary syndrome   Dyspnea   Chest pain   Abnormal nuclear stress test   CKD (chronic kidney disease) stage 4, GFR 15-29 ml/min, post cath   S/P coronary artery stent placement 01/02/14 stent to diag.Promus Premier   1. CAD:  S/p stenting of her diagonal artery.  Plan on PCI of RCA if creatinine is OK.   I would recommend using the left radial artery  Or either femoral artery since she has mild cellulitis of the right arm.  .    2. CKD:  Will  hydrate, anticipate cath today   Disposition:  Length of Stay: 7  Deriona Altemose J. Taelynn Mcelhannon, Jr., MD, FACC 01/06/2014, 8:15 AM Office [phone] Pager [phone]    

## 2014-01-06 NOTE — CV Procedure (Signed)
PERCUTANEOUS CORONARY INTERVENTION REPORT  NAME:  [name] Lane   MRN: [id number] DOB:  Aug 16, 1941   ADMIT DATE: 12/30/2013 Procedure Date: 01/06/2014  INTERVENTIONAL CARDIOLOGIST: Leonie Man, M.D., MS PRIMARY CARE PROVIDER: Claiborne Billings, MD PRIMARY CARDIOLOGIST:  Troy Sine., MD  PATIENT:  [name] Lane is a 73 y.o. female with known CAD (distant PCI to LAD & RCA in ~2004). She presented with signs and symptoms of resting chest pain that was somewhat atypical in nature. Initial cardiac enzymes are negative. Followup troponin did return +0.8. She also had a stress test performed which showed apical anterolateral ischemia. He is therefore referred for invasive evaluation cardiac catheterization. Due to concern for reduced renal function, left ventriculography was not performed on 3/6.  She was found to have significant 2 Vessel disease.  Due to her renal insufficiency, only single vessel PCI was performed on the Diagonal 90-95% lesion.  She now returns for planned staged PCI of the proximal-mid RCA.  She has not be very active over the weekend, but has noted persistent exertional dyspnea.  PRE-OPERATIVE DIAGNOSIS:    NSTEMI - with multiple vessel disease  Exertional Dyspnea noted over weekend  PROCEDURES PERFORMED:    PERCUTANEOUS CORONARY INTERVENTION OF PROX-MID RCA: XIENCE ALPINE DES 3.25 MM X 15 MM DES (3.6 MM)  PROCEDURE:Consent:  Risks of procedure as well as the alternatives and risks of each were explained to the (patient/caregiver).  Consent for procedure obtained. Consent for signed by MD and patient with RN witness -- placed on chart.  PROCEDURE: The patient was brought to the [facility] in the fasting state and prepped and draped in the usual sterile fashion for Right groin or radial access. A modified Allen's test with plethysmography was performed, revealing excellent Ulnar artery collateral flow.  Sterile technique was used  including antiseptics, cap, gloves, gown, hand hygiene, mask and sheet.  Skin prep: Chlorhexidine.  Time Out: Verified patient identification, verified procedure, site/side was marked, verified correct patient position, special equipment/implants available, medications/allergies/relevent history reviewed, required imaging and test results available.  Performed  Access: Left Radial Artery; 6 Fr Sheath -- Seldinger technique (Angiocath Micropuncture Kit)  IA Radial Cocktail, IV Heparin  Units  Guiding Angiography:  6 Fr JR 4 Guide catheter was advanced and exchanged over Versicore-wire into the acscending aorta and used for selective coronary artery engagement.  Right Coronary Artery Angiography: JR4 Guide  TR Band:  1715 Hours, 14 mL air  Hemodynamics:  Central Aortic / Mean Pressures: 112/59 mmHg; 81 mmHg  Angiography:  RCA: Large caliber, dominant vessel in the early mid/proximal segment there is a napkin ringing 80% stenosis just prior to the mid vessel stent. The stent Itself appeared to be patent. There is diffuse mild luminal irregularities throughout the rest the mid and distal vessel that bifurcates distally into the Right Posterior Descending Artery (RPDA) and the Right Posterior AV Groove Branch (RPAV)  RPDA: Moderate caliber somewhat tortuous vessel with diffuse mild luminal irregularities in reaches almost to the apex.  RPL Sysytem:The RPAV begins as a moderate caliber vessel gives rise to small RPL 1 in terminates as a moderate caliber major RPL 2. It does give off a small RPL 3 that has a roughly 90% stenosis. This is a less than 2 mm vessel.  After reviewing the initial angiography, the Intended lesion is the ~80-90% lesion in the early mid/Prox RCA.Marland Kitchen  Preparation were made to proceed with PCI on this lesion.  After  6000 Units of IV Heparin bolus, ACT was 234 Sec --> additional 1000 Units was given & ACT 287 Sec.  Was on Brilinta from initial PCI.  Percutaneous Coronary  Intervention:   Guide: 6 Fr   JR4 Guidewire: BMW Predilation Balloon: Sprinter Legend  2.5 mm x 12 mm;   8 Atm x 35 Sec Stent: Xience Alpine DES 3.25 mm x 15 mm;   Deployed: 10 Atm x 30 Sec,   Post-dilated: 16 Atm x 45 Sec Post-dilation Balloon: Wellington Trek 3.5 mm x 8 mm;   16 Atm x 45 Sec x 2 inflations mid & proximal.  Final Diameter: ~3.6 mm  Post deployment angiography in multiple views, with and without guidewire in place revealed excellent stent deployment and lesion coverage.  There was no evidence of dissection or perforation.  MEDICATIONS:  Anesthesia:  Local Lidocaine 2 ml  Sedation:  1 mg IV Versed, 25 mcg IV fentanyl ;   Omnipaque Contrast: 50 ml  Anticoagulation:  IV Heparin 6000 + 1000 Units   Anti-Platelet Agent:  On Brilinta  PATIENT DISPOSITION:    The patient was transferred to the PACU holding area in a hemodynamicaly stable, chest pain free condition.  The patient tolerated the procedure well, and there were no complications.  EBL:   < 10 ml  The patient was stable before, during, and after the procedure.  POST-OPERATIVE DIAGNOSIS:    Successful PCI of prox-mid RCA with Xience Alpine DSE 3.25 mm x 15 mm (3.6 mm)  Residual small vessel RPL 3 ostial 90% lesion.3  PLAN OF CARE:  Return to Post-Procedure Unit for post Radial Cath care.  Continue DAPT  IV Hydration x 12 hr due to concern for abnormal renal function.  Anticipate potential d/c in AM pending lab evaluation.   Leonie Man, M.D., M.S. [facility] [address] [place], [place]  [address]  [phone]  01/06/2014 5:12 PM

## 2014-01-06 NOTE — Progress Notes (Signed)
PROGRESS NOTE  Subjective:   [name] is a 73 yo admitted with CP. myoview showed a distal ant. Lat area of ischema.   She was found to have a tight diag stenosis and a tight RCA stenosis on cath.  She has had her diag stented. The plan was  for PCI on the RCA today but her creatinine has increased.  BMP is pending   Objective:    Vital Signs:   Temp:  [98.1 F (36.7 C)-98.7 F (37.1 C)] 98.1 F (36.7 C) (03/10 0420) Pulse Rate:  [76-86] 76 (03/10 0420) Resp:  [18] 18 (03/10 0420) BP: (110-134)/(58-71) 133/61 mmHg (03/10 0420) SpO2:  [97 %-99 %] 99 % (03/10 0728)  Last BM Date: 01/05/14   24-hour weight change: Weight change:   Weight trends: Filed Weights   01/03/14 1002 01/04/14 0456 01/05/14 0525  Weight: 194 lb 14.4 oz (88.406 kg) 193 lb 8 oz (87.771 kg) 194 lb 0.1 oz (88 kg)    Intake/Output:  03/09 0701 - 03/10 0700 In: 600 [P.O.:600] Out: 800 [Urine:800]     Physical Exam: BP 133/61  Pulse 76  Temp(Src) 98.1 F (36.7 C) (Oral)  Resp 18  Ht 5\' 3"  (1.6 m)  Wt 194 lb 0.1 oz (88 kg)  BMI 34.38 kg/m2  SpO2 99%  Wt Readings from Last 3 Encounters:  01/05/14 194 lb 0.1 oz (88 kg)  01/05/14 194 lb 0.1 oz (88 kg)  01/05/14 194 lb 0.1 oz (88 kg)    General: Vital signs reviewed and noted.   Head: Normocephalic, atraumatic.  Eyes: conjunctivae/corneas clear.  EOM's intact.   Throat: normal  Neck:  normal  Lungs:  clear today  Heart:  RR, normal S1 S2  Abdomen:  Soft, non-tender, non-distended    Extremities: Normal, right radial cath site looks ok  she has an area of mild cellulitis associated with an old IV site in her right brachial area.  Warm to the touch. No drainage  Neurologic: A&O X3, CN II - XII are grossly intact.   Psych: Normal     Labs: BMET:  Recent Labs  01/05/14 0712  NA 138  K 3.9  CL 97  CO2 25  GLUCOSE 173*  BUN 33*  CREATININE 1.77*  CALCIUM 9.5    Liver function tests: No results found for this basename:  AST, ALT, ALKPHOS, BILITOT, PROT, ALBUMIN,  in the last 72 hours No results found for this basename: LIPASE, AMYLASE,  in the last 72 hours  CBC:  Recent Labs  01/05/14 0712 01/06/14 0428  WBC 10.7* 10.5  HGB 12.9 11.8*  HCT 38.3 34.9*  MCV 80.0 80.8  PLT 276 261    Cardiac Enzymes: No results found for this basename: CKTOTAL, CKMB, TROPONINI,  in the last 72 hours  Coagulation Studies: No results found for this basename: LABPROT, INR,  in the last 72 hours   Other results:  EKG :  NSR , no ST or T wave changes.  Medications:    Infusions: . sodium chloride 50 mL/hr at 01/05/14 [id number]    Scheduled Medications: . aspirin  81 mg Oral Daily  . budesonide  0.5 mg Nebulization BID  . famotidine  20 mg Oral BID  . FLUoxetine  20 mg Oral Daily  . insulin aspart  0-20 Units Subcutaneous TID WC  . insulin glargine  15 Units Subcutaneous QHS  . loratadine  10 mg Oral Daily  . LORazepam  1 mg  Oral QHS  . nebivolol  10 mg Oral Daily  . predniSONE  5 mg Oral Daily  . simvastatin  20 mg Oral q1800  . Ticagrelor  90 mg Oral BID    Assessment/ Plan:   Principal Problem:   NSTEMI (non-ST elevated myocardial infarction) Active Problems:   DIABETES MELLITUS, TYPE II   HYPERCHOLESTEROLEMIA  IIA   Anxiety state, unspecified   HYPERTENSION   CAD - remote LAD and RCA stenting, last cath 12/11- 20% LAD, 40% RCA   Asthma exacerbation   C O P D   OBSTRUCTIVE SLEEP APNEA- non compliant with C-pap   Chest pain with moderate risk of acute coronary syndrome   Dyspnea   Chest pain   Abnormal nuclear stress test   CKD (chronic kidney disease) stage 4, GFR 15-29 ml/min, post cath   S/P coronary artery stent placement 01/02/14 stent to diag.Promus Premier   1. CAD:  S/p stenting of her diagonal artery.  Plan on PCI of RCA if creatinine is OK.   I would recommend using the left radial artery  Or either femoral artery since she has mild cellulitis of the right arm.  .    2. CKD:  Will  hydrate, anticipate cath today   Disposition:  Length of Stay: 7  Vesta Mixer, Montez Hageman., MD, [facility] [place] [facility] 01/06/2014, 8:15 AM Office [phone] Pager [phone]

## 2014-01-06 NOTE — Interval H&P Note (Signed)
History and Physical Interval Note:  The patient has not been extremely active following her initial PCI, making the presence of exertional angina difficult to assess.   Perfecto Purdy W

## 2014-01-06 NOTE — Progress Notes (Signed)
CARDIAC REHAB PHASE I   PRE:  Rate/Rhythm: 78 SR  BP:  Supine: 100/50  Sitting:   Standing:   SaO2: 97 RA  MODE:  Ambulation: 250 ft   POST:  Rate/Rhythm: 85  BP:  Supine:   Sitting: 106/56  Standing:    SaO2: 98 RA 1110-1135 Assisted X 1 and used walker to ambulate. Gait steady with walker, slow pace. Pt able to walk 250 feet without c/o of cp or SOB. Pt tired by end of walk. To recliner after walk with call light in reach and daughter present.  Melina CopaLisa Cori Henningsen RN 01/06/2014 11:37 AM

## 2014-01-06 NOTE — Progress Notes (Signed)
Subjective: No chest pain, no SOB  Objective: Vital signs in last 24 hours: Temp:  [98.1 F (36.7 C)-98.7 F (37.1 C)] 98.1 F (36.7 C) (03/10 0420) Pulse Rate:  [76-86] 76 (03/10 0420) Resp:  [18] 18 (03/10 0420) BP: (110-134)/(58-71) 133/61 mmHg (03/10 0420) SpO2:  [97 %-99 %] 99 % (03/10 0728) Weight change:  Last BM Date: 01/05/14 Intake/Output from previous day: -200 03/09 0701 - 03/10 0700 In: 600 [P.O.:600] Out: 800 [Urine:800] Intake/Output this shift:    PE: General:Pleasant affect, NAD Skin:Warm and dry, brisk capillary refill HEENT:normocephalic, sclera clear, mucus membranes moist Heart:S1S2 RRR without murmur, gallup, rub or click Lungs:clear without rales, rhonchi, or wheezes WUJ:WJXB, non tender, + BS, do not palpate liver spleen or masses Ext:no lower ext edema, 2+ pedal pulses, 2+ radial pulses Neuro:alert and oriented, MAE, follows commands, + facial symmetry  tele:  SR  Lab Results:  Recent Labs  01/05/14 0712 01/06/14 0428  WBC 10.7* 10.5  HGB 12.9 11.8*  HCT 38.3 34.9*  PLT 276 261   BMET  Recent Labs  01/05/14 0712  NA 138  K 3.9  CL 97  CO2 25  GLUCOSE 173*  BUN 33*  CREATININE 1.77*  CALCIUM 9.5   No results found for this basename: TROPONINI, CK, MB,  in the last 72 hours  Lab Results  Component Value Date   CHOL 215* 01/01/2014   HDL 54 01/01/2014   LDLCALC 147* 01/01/2014   LDLDIRECT 136.1 11/02/2010   TRIG 256* 01/01/2014   CHOLHDL 4.0 01/01/2014   Lab Results  Component Value Date   HGBA1C 9.0* 01/01/2014     Lab Results  Component Value Date   TSH 0.255* 12/31/2013      Studies/Results: No results found.  Medications: I have reviewed the patient's current medications. Scheduled Meds: . aspirin  81 mg Oral Daily  . budesonide  0.5 mg Nebulization BID  . famotidine  20 mg Oral BID  . FLUoxetine  20 mg Oral Daily  . insulin aspart  0-20 Units Subcutaneous TID WC  . insulin glargine  15 Units  Subcutaneous QHS  . loratadine  10 mg Oral Daily  . LORazepam  1 mg Oral QHS  . nebivolol  10 mg Oral Daily  . predniSONE  5 mg Oral Daily  . simvastatin  20 mg Oral q1800  . Ticagrelor  90 mg Oral BID   Continuous Infusions: . sodium chloride 50 mL/hr at 01/05/14 0918   PRN Meds:.gi cocktail, HYDROcodone-acetaminophen, levalbuterol, morphine injection  Assessment/Plan: Principal Problem:   NSTEMI (non-ST elevated myocardial infarction) Active Problems:   S/P coronary artery stent placement 01/02/14 stent to diag.Promus Premier    DIABETES MELLITUS, TYPE II   HYPERCHOLESTEROLEMIA  IIA   Anxiety state, unspecified   HYPERTENSION   CAD - remote LAD and RCA stenting, last cath 12/11- 20% LAD, 40% RCA   Asthma exacerbation   C O P D   OBSTRUCTIVE SLEEP APNEA- non compliant with C-pap   Chest pain with moderate risk of acute coronary syndrome   Dyspnea   Chest pain   Abnormal nuclear stress test   CKD (chronic kidney disease) stage 4, GFR 15-29 ml/min, post cath  PLAN: for cath today depending on Cr. For stent to RCA S/p stenting of her diagonal artery   LOS: 7 days   Time spent with pt. :15 minutes. [facility] R  Nurse Practitioner Certified Pager [phone] or after 5pm and  on weekends call [phone] 01/06/2014, 7:53 AM   Attending Note:   The patient was seen and examined.  Agree with assessment and plan as noted above.  Changes made to the above note as needed.  See my note from later the same day  Alvia GrovePhilip J. Evren Shankland, Jr., MD, [facility] MexicoFACC 01/06/2014, 10:04 PM

## 2014-01-07 ENCOUNTER — Encounter (HOSPITAL_COMMUNITY): Payer: Self-pay | Admitting: Physician Assistant

## 2014-01-07 DIAGNOSIS — E785 Hyperlipidemia, unspecified: Secondary | ICD-10-CM

## 2014-01-07 LAB — GLUCOSE, CAPILLARY
GLUCOSE-CAPILLARY: 191 mg/dL — AB (ref 70–99)
Glucose-Capillary: 187 mg/dL — ABNORMAL HIGH (ref 70–99)

## 2014-01-07 LAB — BASIC METABOLIC PANEL
BUN: 20 mg/dL (ref 6–23)
CHLORIDE: 104 meq/L (ref 96–112)
CO2: 22 mEq/L (ref 19–32)
Calcium: 9.1 mg/dL (ref 8.4–10.5)
Creatinine, Ser: 1.4 mg/dL — ABNORMAL HIGH (ref 0.50–1.10)
GFR calc Af Amer: 42 mL/min — ABNORMAL LOW (ref >90)
GFR calc non Af Amer: 36 mL/min — ABNORMAL LOW (ref >90)
Glucose, Bld: 173 mg/dL — ABNORMAL HIGH (ref 70–99)
Potassium: 4.1 mEq/L (ref 3.7–5.3)
Sodium: 139 mEq/L (ref 137–147)

## 2014-01-07 LAB — CBC
HCT: 34.4 % — ABNORMAL LOW (ref 36.0–46.0)
HEMOGLOBIN: 11.4 g/dL — AB (ref 12.0–15.0)
MCH: 27 pg (ref 26.0–34.0)
MCHC: 33.1 g/dL (ref 30.0–36.0)
MCV: 81.5 fL (ref 78.0–100.0)
PLATELETS: 277 10*3/uL (ref 150–400)
RBC: 4.22 MIL/uL (ref 3.87–5.11)
RDW: 14.7 % (ref 11.5–15.5)
WBC: 12.8 10*3/uL — AB (ref 4.0–10.5)

## 2014-01-07 MED ORDER — TICAGRELOR 90 MG PO TABS: 90.0000 mg | ORAL_TABLET | Freq: Two times a day (BID) | ORAL | Status: DC

## 2014-01-07 MED ORDER — CEPHALEXIN 250 MG PO CAPS: 250.0000 mg | ORAL_CAPSULE | Freq: Four times a day (QID) | ORAL | Status: DC

## 2014-01-07 MED ORDER — NEBIVOLOL HCL 10 MG PO TABS: 10.0000 mg | ORAL_TABLET | Freq: Every day | ORAL | Status: DC

## 2014-01-07 NOTE — Progress Notes (Signed)
CARDIAC REHAB PHASE I   PRE:  Rate/Rhythm: 84SR  BP:  Supine:   Sitting: 132/38  Standing:    SaO2: 97%RA  MODE:  Ambulation: 250 ft   POST:  Rate/Rhythm: 88 SR  BP:  Supine:   Sitting: 125/71  Standing:    SaO2: 99%RA [id number] Pt walked 250 ft on RA with rolling walker with steady gait. Needs rolling walker for home use as pt has told me she falls at home due to knee problems. Notified pt's RN of need for walker. Encouraged pt to walk as tolerated going a little farther every day. Discussed CRP 2 and pt gave [facility]. She is going to see if she can arrange transportation through her family. Gave healthy diet tips and reviewed importance of brilinta. Pt has MI booklet and diabetic and heart healthy diets. Pt stated she felt less SOB this walk since procedure. No CP.   Luetta Nuttingharlene Tishie Altmann, RN BSN  01/07/2014 8:48 AM

## 2014-01-07 NOTE — Progress Notes (Signed)
Patient Name: [name] Lane Date of Encounter: 01/07/2014     Principal Problem:   NSTEMI (non-ST elevated myocardial infarction) Active Problems:   DIABETES MELLITUS, TYPE II   HYPERCHOLESTEROLEMIA  IIA   Anxiety state, unspecified   HYPERTENSION   CAD - remote LAD and RCA stenting, last cath 12/11- 20% LAD, 40% RCA   Asthma exacerbation   C O P D   OBSTRUCTIVE SLEEP APNEA- non compliant with C-pap   Chest pain with moderate risk of acute coronary syndrome   Dyspnea   Chest pain   Abnormal nuclear stress test   CKD (chronic kidney disease) stage 4, GFR 15-29 ml/min, post cath   S/P coronary artery stent placement 01/02/14 stent to diag.Promus Premier     SUBJECTIVE  No chest pain, SOB, cellulitis feeling better. With right leg pain which is chronic.   CURRENT MEDS . aspirin  81 mg Oral Daily  . budesonide  0.5 mg Nebulization BID  . cephALEXin  250 mg Oral 4 times per day  . famotidine  20 mg Oral BID  . FLUoxetine  20 mg Oral Daily  . insulin aspart  0-20 Units Subcutaneous TID WC  . insulin glargine  20 Units Subcutaneous QHS  . loratadine  10 mg Oral Daily  . LORazepam  1 mg Oral QHS  . nebivolol  10 mg Oral Daily  . predniSONE  5 mg Oral Daily  . simvastatin  20 mg Oral q1800  . Ticagrelor  90 mg Oral BID    OBJECTIVE  Filed Vitals:   01/06/14 2217 01/06/14 2232 01/07/14 0008 01/07/14 0415  BP:   150/46 139/43  Pulse:  77 76 83  Temp:   98 F (36.7 C) 98.6 F (37 C)  TempSrc:   Oral Oral  Resp:  15 17 18   Height:      Weight:   195 lb 8.8 oz (88.7 kg)   SpO2: 98% 98% 100% 97%    Intake/Output Summary (Last 24 hours) at 01/07/14 0637 Last data filed at 01/07/14 0217  Gross per 24 hour  Intake    360 ml  Output    750 ml  Net   -390 ml   Filed Weights   01/04/14 0456 01/05/14 0525 01/07/14 0008  Weight: 193 lb 8 oz (87.771 kg) 194 lb 0.1 oz (88 kg) 195 lb 8.8 oz (88.7 kg)    PHYSICAL EXAM  General: Pleasant, NAD. Neuro: Alert and  oriented X 3. Moves all extremities spontaneously. Psych: Normal affect. HEENT:  Normal  Neck: Supple without bruits or JVD. Lungs:  Resp regular and unlabored, CTA. + murmur  Heart: RRR no s3, s4, or murmurs. Abdomen: Soft, non-tender, non-distended, BS + x 4.  Extremities: No clubbing, cyanosis or edema. DP/PT/Radials 2+ and equal bilaterally. R UE cellulitis, right leg pain with movement  Accessory Clinical Findings  CBC  Recent Labs  01/06/14 0428 01/07/14 0435  WBC 10.5 12.8*  HGB 11.8* 11.4*  HCT 34.9* 34.4*  MCV 80.8 81.5  PLT 261 277   Basic Metabolic Panel  Recent Labs  01/06/14 0825 01/07/14 0435  NA 139 139  K 3.8 4.1  CL 101 104  CO2 23 22  GLUCOSE 137* 173*  BUN 29* 20  CREATININE 1.46* 1.40*  CALCIUM 9.2 9.1   TELE NSR  Radiology/Studies  Dg Chest 2 View  12/30/2013   CLINICAL DATA:  Left chest pain  EXAM: CHEST  2 VIEW  COMPARISON:  10/25/2011  FINDINGS: Frontal view is apical lordotic in projection. Normal heart size and vascularity. Lungs remain clear. No focal airspace process, collapse or consolidation. No edema, effusion or pneumothorax. Trachea midline. Atherosclerosis of the aorta.  IMPRESSION: No active cardiopulmonary disease.   Electronically Signed   By: Ruel Favors M.D.   On: 12/30/2013 18:23   Nm Myocar Multi W/spect W/wall Motion / Ef  01/01/2014   CLINICAL DATA:  [name] Lane is a 73 year old female who was admitted with chest pain. She was not able to achieve target HR while walking so she received Lexiscan  EXAM: MYOCARDIAL IMAGING WITH SPECT (REST AND PHARMACOLOGIC-STRESS)  GATED LEFT VENTRICULAR WALL MOTION STUDY  LEFT VENTRICULAR EJECTION FRACTION  TECHNIQUE: Standard myocardial SPECT imaging was performed after resting intravenous injection of 10 mCi Tc-74m sestamibi. Subsequently, intravenous infusion of Lexiscan was performed under the supervision of the Cardiology staff. At peak effect of the drug, 30 mCi Tc-69m sestamibi was  injected intravenously and standard myocardial SPECT imaging was performed. Quantitative gated imaging was also performed to evaluate left ventricular wall motion, and estimate left ventricular ejection fraction.  COMPARISON:  None.  FINDINGS: The patient had some nonspecific ST changes at baseline. This did not significantly worsened with the Lexiscan .  The raw data images reveal no motion artifact. The stress images reveal a medium-sized mild defect in the distal anterior lateral wall. Remaining walls are fairly normal. The resting images reveal improved uptake in the distal anterior and lateral wall. There is evidence of mid and distal anteriolateral ischemia. The SDS equals 9.  The quantitative gated SPECT images reveal an end-diastolic volume of 58 mm. End-systolic volume is 21 mL. The ejection fraction is 64%. In looking at the cine loop pictures, I actually think that the left ventricle systolic function is greater than 64% calculated by the computer.  IMPRESSION: This is interpreted as an abnormal stress Myoview study. There is evidence of ischemia in the distal anterior lateral wall. On the raw data images I do not see a breast shadow to account for this anterolateral attenuation. Her left ventricular systolic function is well-preserved. Ejection fraction is calculated to be 64% but may in fact be greater than 64%.     Study Conclusions - Left ventricle: The cavity size was normal. There was moderate concentric hypertrophy. Systolic function was vigorous. The estimated ejection fraction was in the range of 65% to 70%. Wall motion was normal; there were no regional wall motion abnormalities. Doppler parameters are consistent with abnormal left ventricular relaxation (grade 1 diastolic dysfunction). The E/e' ratio is >15, suggesting normal LV filling pressure. - Aortic valve: Trileaflet; mildly calcified leaflets. There was no stenosis. Mild regurgitation. - Mitral valve: Mildly thickened  leaflets . Trivial regurgitation. - Left atrium: The atrium was normal in size. - Atrial septum: No defect or patent foramen ovale was identified. - Inferior vena cava: The vessel was normal in size; the respirophasic diameter changes were in the normal range (= 50%); findings are consistent with normal central venous pressure. - Pericardium, extracardiac: There was no pericardial effusion.   ASSESSMENT AND PLAN  MILEY BLANCHETT is a 73 y.o. female with a history of HTN, HLD, CAD s/p remote stenting, asthmatic COPD, OSA, DM, and CKD who was admitted on 12/30/13 for chest pain. She had an abnormal nuclear stress test suggesting distal anterolateral ischemia and 4th troponin .8. Heart cath on 3/7 revealed severe 2 vessel CAD with 95% proximal D1 lesion -os consistent with the stress test, and  therefore the culprit lesion. She was found to have 95% diagonal lesion as well as 80% RCA lesion. D2 desire to conserve contrast, single-vessel PCI only was performed to be more severe severe diagonal branch. Due to concern for reduced renal function, left ventriculography was not performed on 3/6. She was found to have significant 2 Vessel disease. Due to her renal insufficiency, only single vessel PCI was performed on the Diagonal 90-95% lesion. She returned for planned staged PCI of the proximal-mid RCA yesterday and is now s/p PCI with DES to prox-mid RCA.  NSTEMI- S/p PCI with DES to proximal D1 (3/7) and PCI with DES to prox-mid RCA (3/10) -- Cr. Stable 1.4 -- cont ASA/Brilinta -- EF 65% to 70%, mild AR -- Follow up with Dr. Herbie Baltimore, appts will be scheduled  R UE cellulitis --Improving, continue Keflex   COPD/asthma  --Continue Pulmicort and nebulizers for now per IM  Type 2 diabetes  --Hemoglobin A1c is 9.0. Continue home dose of Lantus. Sliding scale insulin per IM  Hypertension -stable -- Continue bystolic.   Hyperlipidemia  LDL is 110, total cholesterol 215, TG 256   Obstructive  sleep apnea  Place on CPAP each bedtime.   Urban Gibson PA-C  Pager [phone]  I have personally seen and examined this patient with Thereasa Parkin, PA-C. I agree with the assessment and plan as outlined above. She is post PCI and is doing well. Renal function is stable. It will be ok to discharge home today from a cardiac standpoint. Would complete seven days of Keflex for right arm cellulitis. She will need continued therapy with ASA and Brilinta for one year. She will have f/u planned with Dr. Herbie Baltimore.   Man Effertz 01/07/2014 7:28 AM

## 2014-01-07 NOTE — Discharge Summary (Signed)
Physician Discharge Summary  [name] Lane AVW:[id number] DOB: 06/22/1941 DOA: 12/30/2013  PCP: Lupita Dawn, MD  Admit date: 12/30/2013 Discharge date: 01/07/2014  Time spent: 40 minutes  Recommendations for Outpatient Follow-up:  NSTEMI;  -Patient currently chest pain-free. troponin came back elevated at 0.82. Underwent Cardiolite stress test, which is positive.  -Patient S/P Cath 3/6 and S/P PCI of proximal D1 with a Promus Premier DES.  Continue with Brilinta.  -Plan to repeat Cath 3/10 for PCI to proximal RCA.  Hold lasix.  IV fluids. Needs to repeat renal function in am.   COPD/asthma  -Continue Pulmicort and nebulizers for now. -Followup with PCP    Type 2 diabetes  -Hemoglobin A1c is 9.0.  -Discharge patient on home dose of Lantus 30 units QHS -Discharge on Humalog SSI   QAC -Counseled patient that she must meet with her PCP to aggressively control her diabetes. Failure to do so will result in continued sequela of uncontrolled diabetes to include death.   Hypertension  -Continue bystolic 10 mg daily -Patient slightly outside of AHA guideline for BP control. Patient will have cardiologist and PCP titrate her BP medication therapeutic level  .  Hyperlipidemia  -Discharge patient on Zocor 20 mg daily will need to titrate for LDL Goal < 70mg /dL    Obstructive sleep apnea  -Continue CPAP QHS.   AKI  Cr increase to 1.7 on 3/9. Creatinine currently =1.4    Right arm edema/cellulitis;  -on prior IV access site. Started on keflex by cardio. 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 WF PMHx Anxiety, Diabetes Type 2 uncontrolled, OSA, HTN, HLD, COPD, CAD (distant PCI to LAD & RCA in ~2004). She presented with signs and symptoms of resting chest pain that was somewhat atypical in nature. Initial cardiac enzymes are negative. Followup troponin did return +0.8. She also had a stress test performed which showed apical anterolateral ischemia. She is therefore referred for invasive evaluation cardiac catheterization. Due to concern for reduced renal function, left ventriculography was not performed on 3/6. She was found to have significant 2 Vessel disease. Due to her renal insufficiency, only single vessel PCI was performed on the Diagonal 90-95% lesion. She now returns for planned staged PCI of the proximal-mid RCA. She has not be very active over the weekend, but has noted persistent exertional dyspnea. 3/11 patient states ready for discharge, no complaints, understands she will be going to outpatient cardiac rehabilitation [facility] [phone])  Procedures: PERCUTANEOUS CORONARY INTERVENTION OF PROX-MID RCA: XIENCE ALPINE DES 3.25 MM X 15 MM DES (3.6 MM)    Consultations: Dr. Bryan Lemma (cardiology)    Antibiotics Keflex 3/10>>    Discharge Exam: Filed Vitals:   01/07/14 0415 01/07/14 0759 01/07/14 0800 01/07/14 1222  BP: 139/43 132/38 132/38 164/48  Pulse: 83 82  84  Temp: 98.6 F (37 C) 98.5 F (36.9 C)  98.6 F (37 C)  TempSrc: Oral  Oral  Oral  Resp: 18 18  18   Height:      Weight:      SpO2: 97% 97%      General: A./O. x4, NAD Cardiovascular: Regular rhythm and rate, negative murmurs rubs gallops, DP/PT pulse plus one bilateral Respiratory: Mild diffuse wheezing Musculoskeletal; right forearm mild swelling, mild tenderness to palpation.  Discharge Instructions      Discharge Orders   Future Orders Complete By Expires   Amb Referral to Cardiac Rehabilitation  As directed    Comments:     Referring to [facility] Phase 2       Medication List    STOP taking these medications       potassium chloride 10 MEQ tablet  Commonly known as:  KLOR-CON 10     predniSONE 10 MG tablet  Commonly known as:  DELTASONE     roflumilast 500 MCG Tabs tablet  Commonly known as:  DALIRESP      TAKE these medications       acetaminophen 325 MG tablet  Commonly known as:  TYLENOL  Take 975 mg by mouth every 6 (six) hours as needed for moderate pain.     albuterol (2.5 MG/3ML) 0.083% nebulizer solution  Commonly known as:  PROVENTIL  Take 2.5 mg by nebulization every 6 (six) hours as needed for wheezing or shortness of breath.     aspirin 81 MG tablet  Take 81 mg by mouth daily.     budesonide 0.5 MG/2ML nebulizer solution  Commonly known as:  PULMICORT  Take 0.5 mg by nebulization 2 (two) times daily.     cephALEXin 250 MG capsule  Commonly known as:  KEFLEX  Take 1 capsule (250 mg total) by mouth 4 (four) times daily.     cetirizine 10 MG tablet  Commonly known as:  ZYRTEC  Take 10 mg by mouth daily.     FLUoxetine 20 MG tablet  Commonly known as:  PROZAC  Take 20 mg by mouth daily.     furosemide 20 MG tablet  Commonly known as:  LASIX  Take 40-60 mg by mouth 2 (two) times daily. Usually takes 40mg  twice daily, but Can take addt'l 20mg  if needed for edema     insulin glargine 100 UNIT/ML injection  Commonly known as:  LANTUS  Inject 30 Units into the skin at bedtime.     insulin lispro  100 UNIT/ML injection  Commonly known as:  HUMALOG  - Inject 0-12 Units into the skin 3 (three) times daily with meals. If BG less than 150, no insulin given  - 151-200, takes 3 units  - 201-250, takes 5 units   - 251-300, takes 8 units  - 301-350, takes 10 units  - 351-400, takes 12 units  - 400+, call MD     LORazepam 1 MG tablet  Commonly known as:  ATIVAN  Take 1 mg by mouth at bedtime.     nebivolol 10 MG tablet  Commonly known as:  BYSTOLIC  Take  1 tablet (10 mg total) by mouth daily.     NON FORMULARY  Inject 2-3 each into the muscle every Friday. "allergy shots-[name]"     pravastatin 40 MG tablet  Commonly known as:  PRAVACHOL  Take 1 tablet (40 mg total) by mouth daily.     ranitidine 150 MG tablet  Commonly known as:  ZANTAC  Take 150 mg by mouth every morning.     Ticagrelor 90 MG Tabs tablet  Commonly known as:  BRILINTA  Take 1 tablet (90 mg total) by mouth 2 (two) times daily.     tiotropium 18 MCG inhalation capsule  Commonly known as:  SPIRIVA  Place 18 mcg into inhaler and inhale daily.       Allergies  Allergen Reactions  . Atorvastatin Other (See Comments)    REACTION: muscle aches  . Benzonatate Other (See Comments)    REACTION: confusion   Follow-up Information   Follow up with HARDING,DAVID W, MD. (Dr. Elissa Hefty office will contact you to make an appointment in 1 week. You may follow up with a NP or PA as well. )    Specialty:  Cardiology   Contact information:   [address] [place] [address] [phone]       Follow up with COREY,JOHN, MD. Schedule an appointment as soon as possible for a visit in 1 week.   Specialty:  Family Medicine   Contact information:   [address] [facility] [place] [address] [phone]        The results of significant diagnostics from this hospitalization (including imaging, microbiology, ancillary and laboratory) are listed below for reference.     Significant Diagnostic Studies: Dg Chest 2 View  12/30/2013   CLINICAL DATA:  Left chest pain  EXAM: CHEST  2 VIEW  COMPARISON:  10/25/2011  FINDINGS: Frontal view is apical lordotic in projection. Normal heart size and vascularity. Lungs remain clear. No focal airspace process, collapse or consolidation. No edema, effusion or pneumothorax. Trachea midline. Atherosclerosis of the aorta.  IMPRESSION: No active cardiopulmonary disease.   Electronically Signed   By: Ruel Favors M.D.   On: 12/30/2013 18:23   Nm Myocar Multi W/spect W/wall Motion / Ef  01/01/2014   CLINICAL DATA:  Alydia Gosser is a 73 year old female who was admitted with chest pain. She was not able to achieve target HR while walking so she received Lexiscan  EXAM: MYOCARDIAL IMAGING WITH SPECT (REST AND PHARMACOLOGIC-STRESS)  GATED LEFT VENTRICULAR WALL MOTION STUDY  LEFT VENTRICULAR EJECTION FRACTION  TECHNIQUE: Standard myocardial SPECT imaging was performed after resting intravenous injection of 10 mCi Tc-52m sestamibi. Subsequently, intravenous infusion of Lexiscan was performed under the supervision of the Cardiology staff. At peak effect of the drug, 30 mCi Tc-78m sestamibi was injected intravenously and standard myocardial SPECT imaging was performed. Quantitative gated imaging was also performed to evaluate left ventricular wall motion, and estimate left ventricular ejection fraction.  COMPARISON:  None.  FINDINGS: The patient had some nonspecific ST changes at baseline. This did not significantly worsened with the Lexiscan .  The raw data images reveal no motion artifact. The stress images reveal a medium-sized mild defect in the distal anterior lateral wall. Remaining walls are fairly normal. The resting images reveal improved uptake in the distal anterior and lateral wall. There is evidence of mid and distal anteriolateral ischemia. The SDS equals 9.  The quantitative gated SPECT images reveal an end-diastolic volume of 58 mm.  End-systolic volume is 21 mL. The ejection fraction is 64%. In looking at the cine loop pictures, I actually think that the left ventricle systolic function is greater than 64% calculated by the computer.  IMPRESSION: This is interpreted as an abnormal stress Myoview study. There is evidence of ischemia in the distal anterior lateral wall. On the raw data images I do not see a breast shadow to account for this anterolateral attenuation. Her left ventricular systolic function is well-preserved. Ejection fraction is calculated to be 64% but may in fact be greater than 64%.   Electronically Signed   By: Kristeen MissPhilip  Nahser   On: 01/01/2014 16:41    Microbiology: Recent Results (from the past 240 hour(s))  URINE CULTURE     Status: None   Collection Time    12/31/13 12:53 PM      Result Value Ref Range Status   Specimen Description URINE, RANDOM   Final   Special Requests NONE   Final   Culture  Setup Time     Final   Value: 12/31/2013 13:29     Performed at [facility]   Colony Count     Final   Value: 20,OOO COLONIES/ML     Performed at [facility]   Culture     Final   Value: Multiple bacterial morphotypes present, none predominant. Suggest appropriate recollection if clinically indicated.     Performed at [facility]   Report Status 01/01/2014 FINAL   Final     Labs: Basic Metabolic Panel:  Recent Labs Lab 01/02/14 1135 01/03/14 0352 01/05/14 0712 01/06/14 0825 01/07/14 0435  NA 140 138 138 139 139  K 4.2 3.5* 3.9 3.8 4.1  CL 100 97 97 101 104  CO2 24 25 25 23 22   GLUCOSE 127* 132* 173* 137* 173*  BUN 31* 22 33* 29* 20  CREATININE 1.42* 1.28* 1.77* 1.46* 1.40*  CALCIUM 9.3 9.5 9.5 9.2 9.1   Liver Function Tests: No results found for this basename: AST, ALT, ALKPHOS, BILITOT, PROT, ALBUMIN,  in the last 168 hours No results found for this basename: LIPASE, AMYLASE,  in the last 168 hours No results found for this basename: AMMONIA,  in the last 168  hours CBC:  Recent Labs Lab 01/02/14 0518 01/03/14 0352 01/05/14 0712 01/06/14 0428 01/07/14 0435  WBC 11.5* 9.8 10.7* 10.5 12.8*  HGB 13.0 13.3 12.9 11.8* 11.4*  HCT 38.4 39.6 38.3 34.9* 34.4*  MCV 80.5 80.5 80.0 80.8 81.5  PLT 302 271 276 261 277   Cardiac Enzymes: No results found for this basename: CKTOTAL, CKMB, CKMBINDEX, TROPONINI,  in the last 168 hours BNP: BNP (last 3 results)  Recent Labs  12/30/13 1728  PROBNP 320.8*   CBG:  Recent Labs Lab 01/06/14 0634 01/06/14 1159 01/06/14 1750 01/06/14 2123 01/07/14 0807  GLUCAP 157* 177* 133* 144* 191*       Signed:  Carolyne Littlesurtis Woods, MD [facility] [phone] pager

## 2014-01-07 NOTE — Care Management Note (Signed)
    Page 1 of 2   01/07/2014     11:01:38 AM   CARE MANAGEMENT NOTE 01/07/2014  Patient:  [name] Lane, [name] Lane   Account Number:  [id number]  Date Initiated:  01/03/2014  Documentation initiated by:  [facility]  Subjective/Objective Assessment:   adm:  Chest pain     Action/Plan:   discharge planning   Anticipated DC Date:  01/04/2014   Anticipated DC Plan:  [place]  CM consult  Medication Assistance      Choice offered to / List presented to:     DME arranged  Vassie Moselle      DME agency  [place].        Status of service:  Completed, signed off Medicare Important Message given?   (If response is "NO", the following Medicare IM given date fields will be blank) Date Medicare IM given:   Date Additional Medicare IM given:    Discharge Disposition:  HOME/SELF CARE  Per UR Regulation:    If discussed at Long Length of Stay Meetings, dates discussed:   01/06/2014    Comments:  01/07/14 0845 Fuller Mandril, RN, BSN, [place] [phone] Spoke with pt at bedside regarding benefits check for Brilinta.  Pt has brochure with 30 day free card and refill assistance card intact.  Pt utilizes [facility] in [place] for prescription needs.  NCM called pharmacy to confirm availability of medication.  Information relayed to pt.  Pt verbalizes importance of filling medication upon discharge.  Pt requires DME rolling walker to steady her gait while walking.  01/03/14 09:00 CM met spoke with pt in room and gave her a Brilinta starter kit and a discount card.  Pt verbalized understanding she may have to have new medication pre-authorized at her MD office followup visit and will make sure she asks her followup MD.  No other CM needs were communicated.  Mariane Masters, BSN, CM [phone].

## 2014-01-15 ENCOUNTER — Ambulatory Visit: Payer: Medicare Other | Admitting: Cardiology

## 2014-01-20 ENCOUNTER — Ambulatory Visit (INDEPENDENT_AMBULATORY_CARE_PROVIDER_SITE_OTHER): Payer: Medicare Other

## 2014-01-20 ENCOUNTER — Encounter: Payer: Self-pay | Admitting: Pulmonary Disease

## 2014-01-20 ENCOUNTER — Ambulatory Visit (INDEPENDENT_AMBULATORY_CARE_PROVIDER_SITE_OTHER): Payer: Medicare Other | Admitting: Pulmonary Disease

## 2014-01-20 VITALS — BP 124/62 | HR 69 | Temp 97.9°F | Ht 63.0 in | Wt 198.2 lb

## 2014-01-20 DIAGNOSIS — J449 Chronic obstructive pulmonary disease, unspecified: Secondary | ICD-10-CM

## 2014-01-20 DIAGNOSIS — G4730 Sleep apnea, unspecified: Secondary | ICD-10-CM

## 2014-01-20 DIAGNOSIS — J45901 Unspecified asthma with (acute) exacerbation: Secondary | ICD-10-CM

## 2014-01-20 DIAGNOSIS — I2510 Atherosclerotic heart disease of native coronary artery without angina pectoris: Secondary | ICD-10-CM

## 2014-01-20 DIAGNOSIS — J45909 Unspecified asthma, uncomplicated: Secondary | ICD-10-CM

## 2014-01-20 DIAGNOSIS — J4489 Other specified chronic obstructive pulmonary disease: Secondary | ICD-10-CM

## 2014-01-20 NOTE — Assessment & Plan Note (Signed)
[name] today -resume 2 wk pattern, will get off steroids first Decrease prednisone to 5mg  M/W/F

## 2014-01-20 NOTE — Assessment & Plan Note (Addendum)
CPAP supplies will be renewed   Weight loss encouraged, compliance with goal of at least 4-6 hrs every night is the expectation. Advised against medications with sedative side effects Cautioned against driving when sleepy - understanding that sleepiness will vary on a day to day basis  

## 2014-01-20 NOTE — Assessment & Plan Note (Signed)
Refills on pulmicort, daliresp & spiriva

## 2014-01-20 NOTE — Patient Instructions (Signed)
[name] today Refills on pulmicort, daliresp & spiriva CPAP supplies will be renewed Decrease prednisone to 5mg  M/W/F

## 2014-01-20 NOTE — Progress Notes (Signed)
   Subjective:    Patient ID: [name] Lane, female    DOB: 07/12/1941, 73 y.o.   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 Triggers being activity, perfumes, spring allergies (claritin helps) , cold weather etc. Spirometry >> FEv1 48 % c/w severe airway obstruction.   Son (EMT) reports severe anxiety, now on ativan  ON spiriva since june 2011with improved exercise tolerance  02/2011 >> started daliresp  Has a dog &  bird- cockatoo, clean cage.  RAST - IGE 636,  +house dust, mildly pos for cat & dog dander    PSG -Moderate OSA corrected by 12 cm Download 3/10-4/15/12 >> good control of events , leak ++ on full face mask, used son's nasal mask & liked it >>Changed to fixed pressure 12 cm   09/18/12 While being examined, she developed sudden onset increased upper airway wheezing with coughing  Seemed to improve somewhat with calming down  Son feels she has similar episodes at home - ? Anxiety induced    01/20/2014  Chief Complaint  Patient presents with  . Follow-up    Last seen 10/01/12. Pt reports she is still trying to recover for MI she had 01/02/14. She has missed to xolair injections. Wants to restart.    Adm 3/3-3/11/15 with nstemi, s/p PCI to diag & RCA Last xolair on 12/19/13 -has done remarkably well on xolair & 5mg  prednisone since jan 2014 - reduced hospitalisations No flares since 2013 dec Compliant with xolair & other meds, was on daily pred- inadvertently stopped in the hospital - she has self renewed Anxiety  Review of Systems neg for any significant sore throat, dysphagia, itching, sneezing, nasal congestion or excess/ purulent secretions, fever, chills, sweats, unintended wt loss, pleuritic or exertional cp,  hempoptysis, orthopnea pnd or change in chronic leg swelling. Also denies presyncope, palpitations, heartburn, abdominal pain, nausea, vomiting, diarrhea or change in bowel or urinary habits, dysuria,hematuria, rash, arthralgias, visual complaints, headache, numbness weakness or ataxia.     Objective:   Physical Exam Gen. Pleasant, obese, in no distress ENT - no lesions, no post nasal drip Neck: No JVD, no thyromegaly, no carotid bruits Lungs: no use of accessory muscles, no dullness to percussion, decreased without rales or rhonchi  Cardiovascular: Rhythm regular, heart sounds  normal, no murmurs or gallops, no peripheral edema Musculoskeletal: No deformities, no cyanosis or clubbing , no tremors        Assessment & Plan:

## 2014-01-22 MED ORDER — OMALIZUMAB 150 MG ~~LOC~~ SOLR
375.0000 mg | Freq: Once | SUBCUTANEOUS | Status: AC
  Administered 2014-01-22: 375 mg via SUBCUTANEOUS

## 2014-01-29 ENCOUNTER — Telehealth: Payer: Self-pay | Admitting: *Deleted

## 2014-01-29 NOTE — Telephone Encounter (Signed)
Faxed ordered for walker -signed

## 2014-02-06 ENCOUNTER — Telehealth: Payer: Self-pay | Admitting: Cardiology

## 2014-02-06 ENCOUNTER — Ambulatory Visit (INDEPENDENT_AMBULATORY_CARE_PROVIDER_SITE_OTHER): Payer: Medicare Other

## 2014-02-06 DIAGNOSIS — J45909 Unspecified asthma, uncomplicated: Secondary | ICD-10-CM

## 2014-02-06 MED ORDER — CLOPIDOGREL BISULFATE 75 MG PO TABS: 75.0000 mg | ORAL_TABLET | Freq: Every day | ORAL | Status: DC

## 2014-02-06 NOTE — Addendum Note (Signed)
Addended by: Beecher McardleSCOTT, Arshia Spellman R on: 02/06/2014 05:53 PM   Modules accepted: Orders

## 2014-02-06 NOTE — Telephone Encounter (Signed)
Discussed w/ Dr. Herbie BaltimoreHarding and advised pt be switched to Plavix 75 mg daily.

## 2014-02-06 NOTE — Telephone Encounter (Signed)
Call x 3 to number.  Informed x 2 that number is not for LaporteJustin.  Last call, no answer and voicemail not set up.  Unable to reach pt as the same number is listed.  Pt will have to purchase from pharmacy as there is not enough time to make it to the clinic to get samples or to complete PA.  This is a new patient who has not established w/ us and appt is scheduled for next week, 4.16.15.

## 2014-02-06 NOTE — Telephone Encounter (Signed)
Message forwarded to Dr. Harding (FYI). 

## 2014-02-06 NOTE — Telephone Encounter (Signed)
Needs to have her Birlinta refilled but they say that the doctor needs to authorized it . Please call to let them know  Because she is out of her medication    Thanks

## 2014-02-06 NOTE — Telephone Encounter (Signed)
[facility] [place].  Pt has filled Brilinta there.  Informed script will be sent for alternate to Brilinta.   Rx sent to pharmacy for Plavix.

## 2014-02-12 ENCOUNTER — Encounter: Payer: Self-pay | Admitting: Cardiology

## 2014-02-12 ENCOUNTER — Ambulatory Visit (INDEPENDENT_AMBULATORY_CARE_PROVIDER_SITE_OTHER): Payer: Medicare Other | Admitting: Cardiology

## 2014-02-12 VITALS — BP 136/70 | HR 71 | Ht 64.0 in | Wt 195.9 lb

## 2014-02-12 DIAGNOSIS — R0989 Other specified symptoms and signs involving the circulatory and respiratory systems: Secondary | ICD-10-CM

## 2014-02-12 DIAGNOSIS — I2510 Atherosclerotic heart disease of native coronary artery without angina pectoris: Secondary | ICD-10-CM

## 2014-02-12 DIAGNOSIS — E7800 Pure hypercholesterolemia, unspecified: Secondary | ICD-10-CM

## 2014-02-12 DIAGNOSIS — I214 Non-ST elevation (NSTEMI) myocardial infarction: Secondary | ICD-10-CM

## 2014-02-12 DIAGNOSIS — E669 Obesity, unspecified: Secondary | ICD-10-CM

## 2014-02-12 DIAGNOSIS — I10 Essential (primary) hypertension: Secondary | ICD-10-CM

## 2014-02-12 DIAGNOSIS — Z9889 Other specified postprocedural states: Secondary | ICD-10-CM

## 2014-02-12 DIAGNOSIS — Z95820 Peripheral vascular angioplasty status with implants and grafts: Secondary | ICD-10-CM

## 2014-02-12 MED ORDER — TICAGRELOR 90 MG PO TABS: 90.0000 mg | ORAL_TABLET | Freq: Two times a day (BID) | ORAL | Status: DC

## 2014-02-12 MED ORDER — OMALIZUMAB 150 MG ~~LOC~~ SOLR
375.0000 mg | Freq: Once | SUBCUTANEOUS | Status: AC
  Administered 2014-02-12: 375 mg via SUBCUTANEOUS

## 2014-02-12 MED ORDER — NEBIVOLOL HCL 10 MG PO TABS: 10.0000 mg | ORAL_TABLET | Freq: Every day | ORAL | Status: DC

## 2014-02-12 MED ORDER — NITROGLYCERIN 0.4 MG SL SUBL: 0.4000 mg | SUBLINGUAL_TABLET | SUBLINGUAL | Status: DC | PRN

## 2014-02-12 NOTE — Patient Instructions (Signed)
LABS LIPID and CMP July 2015  Your physician has requested that you have a carotid duplex. This test is an ultrasound of the carotid arteries in your neck. It looks at blood flow through these arteries that supply the brain with blood. Allow one hour for this exam. There are no restrictions or special instructions.  RESTART Brilinta, Bystolic  Your physician wants you to follow-up in 3 month Dr Herbie BaltimoreHarding. You will receive a reminder letter in the mail two months in advance. If you don't receive a letter, please call our office to schedule the follow-up appointment.

## 2014-02-13 ENCOUNTER — Telehealth: Payer: Self-pay | Admitting: Cardiology

## 2014-02-13 MED ORDER — NITROGLYCERIN 0.4 MG SL SUBL: 0.4000 mg | SUBLINGUAL_TABLET | SUBLINGUAL | Status: DC | PRN

## 2014-02-13 NOTE — Telephone Encounter (Signed)
Called to schedule carotid doppler.  Unable to leave msg.  Phone not set up for voicemail.

## 2014-02-14 ENCOUNTER — Encounter: Payer: Self-pay | Admitting: Cardiology

## 2014-02-14 DIAGNOSIS — Z95820 Peripheral vascular angioplasty status with implants and grafts: Secondary | ICD-10-CM | POA: Insufficient documentation

## 2014-02-14 DIAGNOSIS — R0989 Other specified symptoms and signs involving the circulatory and respiratory systems: Secondary | ICD-10-CM | POA: Insufficient documentation

## 2014-02-14 DIAGNOSIS — E669 Obesity, unspecified: Secondary | ICD-10-CM | POA: Insufficient documentation

## 2014-02-14 NOTE — Assessment & Plan Note (Signed)
Now with 2 new DES stents in the diagonal and RCA. She definitely needs to be on a complete dual antiplatelet regimen. We will definitely get her back on Brilinta, if this becomes a financial issue we can switch to Plavix. It again reiterated the importance of making sure that she does not run out of this medication. If she is close to running out she is to contact any one of our offices to come in and get samples. She should not wait the weekend.  Plan: Take 2 doses of Brilinta tonight and then reestablish regular dosing.  Continue Bystolic, she'll need refills for that  She is not on an ACE inhibitor due to her pulmonary disease, but with being a diabetic, should probably be on ARB. Interestingly she is on amlodipine 5 mg, which could be changed out for an ARB in the future  Low-dose furosemide for mild swelling

## 2014-02-14 NOTE — Assessment & Plan Note (Signed)
Recommended continued diet and weight loss efforts. She is limited as far as exercise goes but needs to make sure that she is trying to do something to burn calories.

## 2014-02-14 NOTE — Assessment & Plan Note (Signed)
I cannot see this was evaluated in the past. We'll check carotid Dopplers.

## 2014-02-14 NOTE — Assessment & Plan Note (Signed)
See above for dual antiplatelet therapy.

## 2014-02-14 NOTE — Assessment & Plan Note (Signed)
Being followed by PCP in status he. She is on pravastatin. In the hospital her LDL level was 110 which is not at goal. Hopefully with her diet changes this will improve but I think she needs more aggressive treatment. Unfortunately, cost seems to be an issue. It may be worth trying to get prior authorization for insurance covers of Crestor since she had myalgias with Lipitor and Zocor.

## 2014-02-14 NOTE — Assessment & Plan Note (Signed)
On Bystolic and amlodipine is noted. In the future I will consider switching to an ARB from amlodipine with her being diabetic.

## 2014-02-14 NOTE — Progress Notes (Signed)
PATIENT: [name] Lane MRN: [id number]  DOB: 03/18/1941   DOV:02/14/2014 PCP: Pricilla HolmSHARPE, LESLIE M, MD  Clinic Note: Chief Complaint  Patient presents with  . Follow-up    post hosp PCI , no chest pain - little stinging, no sob with walking a lot , no edema, out of brilinta since friday, out of bystolic    HPI: [name] Lane is a 73 y.o.  female with a PMH below who presents today for initial post hospital stay. She is a former patient of Dr. Shawnie Ponshomas Stuckey with a distant history of CAD, status post PCI to the LAD and RCA in 2003 with one episode of in-stent restenosis postdilated in the LAD the next year. Since then she did relatively well from a cardiac standpoint. She presented to [facility] on 12/30/2013 with signs and symptoms of atypical chest discomfort but at rest. Her troponin levels increased to level 0.8 consistent with mild non-STEMI. She then underwent a nuclear stress test which showed apical anterior lateral ischemia and was referred for cardiac catheterization that revealed a 90% stenosis in the proximal portion of the D1 as well as a 80% stenosis just prior to the RCA stent. Both lesions were treated in a staged fashion to preserve renal function. D1 - 2.5 mm x 12 mm Promus Premier DES (2.4 mm), mid RCA a Xience alpine DES 3.5 mm x 15 mm (postdilated 3.5 mm).  She was also discharged on Brilinta, but apparently only received a one-month prescription for the first month free, and ran out of her supply last Friday. Unfortunately her telephone number was incorrect and therefore she was not notified that a prescription be called in for her. She now presents for followup.  Interval History: Essentially since her discharge she did doing relatively well with no major problems. She's had intermittent episodes of mild twinges in her chest, but nothing consistent with her angina symptoms.  She has noted that they had been occurring more in the last week than before. She is back to her baseline  level of exertional dyspnea from her COPD. She has tried to make a significant change in her dietary habits as well as attempts at exercise. She has noted a significant improvement in her blood glucose levels. Unfortunately the last few days it didn't really bother by back spasms. She is not noting much. Posterior walking is limited by right knee pain. She has intermittent vertigo-type dizziness, that is not necessarily positional. Mild intermittent edema only.  The remainder of Cardiovascular ROS: positive for - dyspnea on exertion and Intermittent skipped beats, occasional chest twinges negative for - edema, loss of consciousness, murmur, orthopnea, paroxysmal nocturnal dyspnea, rapid heart rate, shortness of breath or Lightheadedness, syncope/near STEMI, TIA/amaurosis fugax, melena, hematochezia, hematuria, epistaxis. No claudication.  Past Medical History  Diagnosis Date  . Non-STEMI (non-ST elevated myocardial infarction) 2001; March 2015    2001: Treated medically because of his markedly; 50-60% LAD;; 2015 - mild positive troponins with positive nuclear stress test  . CAD S/P percutaneous coronary angioplasty 2003, March 2015    2003 - BMS to RCA and LAD; PTCA of ISR later in 2003; 12/2013 -  staged DES PCI of the D1 and mid RCA  . Pure hypercholesterolemia   . Hypertension   . Type 2 diabetes mellitus treated with insulin   . COPD (chronic obstructive pulmonary disease)     With chronic dyspnea  . Unspecified asthma(493.90)   . Obstructive sleep apnea   . Edema   .  GE reflux   . Carotid bruit   . Anxiety   . CKD (chronic kidney disease) stage 4, GFR 15-29 ml/min, post cath 01/06/2014    Prior Cardiac Evaluation and Past Surgical History: Past Surgical History  Procedure Laterality Date  . Appendectomy    . Cardiac catheterization  11/18/1999    Small non-STEMI; moderate 50-60% RCA lesion treated medically secondary to percutaneous RP bleed  . Percutaneous coronary stent  intervention (pci-s)  02/17/2002    Two-vessel PCI: 80% mid LAD -- 2.25 mm x 8 mm Express 2 BMS; in RCA RCA 80% 3.5 mm x 12 mm Express 2 BMS  . Coronary angioplasty   04/29/2002    Cutting Balloon PTCA of LAD stent for ISR with 2.5 mm balloon  . Cardiac catheterization  April 2004, October 2010, December 2011    Stable CAD with mild ISR in LAD and RCA; treated medically  . Cardiac catheterization  01/04/2012    4 small non-STEMI; Myoview with lateral ischemia-- 95% proximal D1, 80% mid RCA post stent -- staged PCI   . Percutaneous coronary stent intervention (pci-s)   03/07 & 06/2012    7th - Two-vessel PCI: 80% mid LAD -- 2.25 mm x 8 mm Express 2 BMS; in RCA RCA 80% 3.5 mm x 12 mm Express 2 BMS; 9th - mid RCA a Xience alpine DES 3.5 mm x 15 mm (postdilated 3.5 mm)   . Transthoracic echocardiogram   01/02/2014    (Most recent) moderate concentric LVH. Normal EF of 65-70%. Grade 1 diastolic dysfunction. Mild AI  . Nm myoview ltd  01/01/2014    EF 64%. The distal anterior/anterolateral ischemia -- referred for cath    Allergies  Allergen Reactions  . Atorvastatin Other (See Comments)    REACTION: muscle aches  . Benzonatate Other (See Comments)    REACTION: confusion    Current Outpatient Prescriptions  Medication Sig Dispense Refill  . acetaminophen (TYLENOL) 325 MG tablet Take 975 mg by mouth every 6 (six) hours as needed for moderate pain.      Marland Kitchen albuterol (PROVENTIL) (2.5 MG/3ML) 0.083% nebulizer solution Take 2.5 mg by nebulization every 6 (six) hours as needed for wheezing or shortness of breath.      . Albuterol Sulfate (PROAIR HFA IN) Inhale 2 puffs into the lungs every 4 (four) hours as needed.      Marland Kitchen amLODipine (NORVASC) 5 MG tablet Take 5 mg by mouth daily.      Marland Kitchen aspirin 81 MG tablet Take 81 mg by mouth daily.        . budesonide (PULMICORT) 0.5 MG/2ML nebulizer solution Take 0.5 mg by nebulization 2 (two) times daily.      . cetirizine (ZYRTEC) 10 MG tablet Take 10 mg by  mouth daily.        Marland Kitchen FLUoxetine (PROZAC) 20 MG tablet Take 20 mg by mouth daily.       . furosemide (LASIX) 20 MG tablet 1-2 tablets daily      . insulin glargine (LANTUS) 100 UNIT/ML injection Inject 30 Units into the skin at bedtime.       . insulin lispro (HUMALOG) 100 UNIT/ML injection Inject 0-12 Units into the skin 3 (three) times daily with meals. If BG less than 150, no insulin given 151-200, takes 3 units 201-250, takes 5 units  251-300, takes 8 units 301-350, takes 10 units 351-400, takes 12 units 400+, call MD      . LORazepam (ATIVAN) 1 MG tablet  Take 1 mg by mouth at bedtime.       . nitroGLYCERIN (NITROSTAT) 0.4 MG SL tablet Place 1 tablet (0.4 mg total) under the tongue every 5 (five) minutes as needed for chest pain.  25 tablet  6  . pravastatin (PRAVACHOL) 40 MG tablet Take 1 tablet (40 mg total) by mouth daily.  30 tablet  1  . ranitidine (ZANTAC) 150 MG tablet Take 150 mg by mouth every morning.       . Ticagrelor (BRILINTA) 90 MG TABS tablet Take 1 tablet (90 mg total) by mouth 2 (two) times daily.  48 tablet  0  . tiotropium (SPIRIVA) 18 MCG inhalation capsule Place 18 mcg into inhaler and inhale daily.      . methocarbamol (ROBAXIN) 500 MG tablet Take 500 mg by mouth every 8 (eight) hours as needed for muscle spasms.      . nebivolol (BYSTOLIC) 10 MG tablet Take 1 tablet (10 mg total) by mouth daily.  48 tablet  0  . NON FORMULARY Inject 2-3 each into the muscle every Friday. "allergy shots-[name]"       No current facility-administered medications for this visit.   she also needs refill of Bystolic  History   Social History Narrative   She is a single mother of 7 (no note of being added divorced or without) 10 grandchildren and 15 great-grandchildren. She lives with her son. Either one of 2 sons or the daughter come with her to appointments. They carry a notebook complete all her medical data. She does all her activities of daily living. She is a former smoker  smoked 20 packs a day for 30 years and quit in 2001. She did not drink alcohol. He does not get routine exercise due to dyspnea.   She is a former Writer for [employer]   ROS: A comprehensive Review of Systems - Negative except , Significant right knee pain and peripheral less than neuropathy symptoms.   Epworth Sleepiness Scale: Situation   Chance of Dozing/Sleeping (0 = never , 1 = slight chance , 2 = moderate chance , 3 = high chance )   sitting and reading  2    watching TV  2    sitting inactive in a public place  1    being a passenger in a motor vehicle for an hour or more  1    lying down in the afternoon  2    sitting and talking to someone  0    sitting quietly after lunch (no alcohol)  2    while stopped for a few minutes in traffic as the driver  0    Total Score   10     PHYSICAL EXAM BP 136/70  Pulse 71  Ht 5\' 4"  (1.626 m)  Wt 195 lb 14.4 oz (88.86 kg)  BMI 33.61 kg/m2 General appearance: alert, cooperative, appears stated age, no distress, mildly obese and Pleasant mood and affect. Not bradycardic and her questions; her son answers most questions Neck: no adenopathy, no JVD, supple, symmetrical, trachea midline and Soft right carotid bruit that was previously noted Lungs: Mild interstitial sounds, but no significant wheezes rales or rhonchi. Nonlabored good air movement. Heart: regular rate and rhythm, S1, S2 normal, no murmur, click, rub or gallop Abdomen: soft, non-tender; bowel sounds normal; no masses,  no organomegaly and  mildd  obeesity Extremities: extremities normal, atraumatic, no cyanosis or edema and varicose veins noted Pulses: 2+ and symmetric Neurologic: Grossly normal  Adult ECG Report  Rate: 71 ;  Rhythm: normal sinus rhythm  QRS Axis: 8 ;  PR Interval: 138 ;  QRS Duration: 80 ; QTc: 425;  Voltages: Normal  Conduction Disturbances: none  Other Abnormalities: Nonspecific ST and T wave changes but no ischemic changes  Narrative Interpretation:  Mostly normal EKG  Recent Labs: None since hospitalization  ASSESSMENT / PLAN: NSTEMI (non-ST elevated myocardial infarction) - x2 Mild troponin elevation with positive stress test. Staged PCI of the LAD and RCA -- preserved LVEF. No recurrent MI-type discomfort, however and will the concern of having some mild left-sided discomfort since being off Brilinta. These episodes of very fleeting, but if they are longer lasting, I would be very concerned.  CAD - remote LAD and RCA stenting, last cath 12/11- 20% LAD, 40% RCA Now with 2 new DES stents in the diagonal and RCA. She definitely needs to be on a complete dual antiplatelet regimen. We will definitely get her back on Brilinta, if this becomes a financial issue we can switch to Plavix. It again reiterated the importance of making sure that she does not run out of this medication. If she is close to running out she is to contact any one of our offices to come in and get samples. She should not wait the weekend.  Plan: Take 2 doses of Brilinta tonight and then reestablish regular dosing.  Continue Bystolic, she'll need refills for that  She is not on an ACE inhibitor due to her pulmonary disease, but with being a diabetic, should probably be on ARB. Interestingly she is on amlodipine 5 mg, which could be changed out for an ARB in the future  Low-dose furosemide for mild swelling  HYPERTENSION On Bystolic and amlodipine is noted. In the future I will consider switching to an ARB from amlodipine with her being diabetic.  S/P angioplasty with stent; RCA, LAD and D1  See above for dual antiplatelet therapy.  HYPERCHOLESTEROLEMIA  IIA Being followed by PCP in status he. She is on pravastatin. In the hospital her LDL level was 110 which is not at goal. Hopefully with her diet changes this will improve but I think she needs more aggressive treatment. Unfortunately, cost seems to be an issue. It may be worth trying to get prior authorization for  insurance covers of Crestor since she had myalgias with Lipitor and Zocor.  Obesity (BMI 30-39.9) Recommended continued diet and weight loss efforts. She is limited as far as exercise goes but needs to make sure that she is trying to do something to burn calories.  CAROTID BRUIT, RIGHT I cannot see this was evaluated in the past. We'll check carotid Dopplers.    Orders Placed This Encounter  Procedures  . EKG 12-Lead  . Doppler carotid    Standing Status: Future     Number of Occurrences:      Standing Expiration Date: 02/12/2015    Scheduling Instructions:     DX RIGHT BRUIT    Order Specific Question:  Laterality    Answer:  Bilateral    Order Specific Question:  Where should this test be performed:    Answer:  [facility]  Lipid Panel & CMP in July.  Meds ordered this encounter  Medications  . nebivolol (BYSTOLIC) 10 MG tablet    Sig: Take 1 tablet (10 mg total) by mouth daily.    Dispense:  48 tablet    Refill:  0    Lot [id number] EXP 05/2014  .  Ticagrelor (BRILINTA) 90 MG TABS tablet    Sig: Take 1 tablet (90 mg total) by mouth 2 (two) times daily.    Dispense:  48 tablet    Refill:  0    Lot [id number] 12/2015  . nitroGLYCERIN (NITROSTAT) 0.4 MG SL tablet    Sig: Place 1 tablet (0.4 mg total) under the tongue every 5 (five) minutes as needed for chest pain.    Dispense:  25 tablet    Refill:  6    Followup: 3 months  DAVID W. Herbie BaltimoreHARDING, M.D., M.S. Interventional Cardiology [facility]

## 2014-02-14 NOTE — Assessment & Plan Note (Addendum)
Mild troponin elevation with positive stress test. Staged PCI of the LAD and RCA -- preserved LVEF. No recurrent MI-type discomfort, however and will the concern of having some mild left-sided discomfort since being off Brilinta. These episodes of very fleeting, but if they are longer lasting, I would be very concerned.

## 2014-02-16 ENCOUNTER — Other Ambulatory Visit: Payer: Self-pay | Admitting: *Deleted

## 2014-02-16 DIAGNOSIS — Z95820 Peripheral vascular angioplasty status with implants and grafts: Secondary | ICD-10-CM

## 2014-02-16 MED ORDER — NITROGLYCERIN 0.4 MG SL SUBL: 0.4000 mg | SUBLINGUAL_TABLET | SUBLINGUAL | Status: DC | PRN

## 2014-02-16 MED ORDER — NITROGLYCERIN 0.4 MG SL SUBL: 0.4000 mg | SUBLINGUAL_TABLET | SUBLINGUAL | Status: AC | PRN

## 2014-02-16 NOTE — Telephone Encounter (Signed)
NTG would not go electronic - [facility] [place] [facility] [place].

## 2014-02-17 ENCOUNTER — Telehealth (HOSPITAL_COMMUNITY): Payer: Self-pay | Admitting: *Deleted

## 2014-02-17 ENCOUNTER — Telehealth: Payer: Self-pay | Admitting: Cardiology

## 2014-02-17 NOTE — Telephone Encounter (Signed)
Tried to call pt on 4/17 and 02/17/14 to schedule carotid doppler.I have been unable to reach this patient by phone.  A letter is being sent. To leave message on both occasions.

## 2014-02-20 ENCOUNTER — Ambulatory Visit (INDEPENDENT_AMBULATORY_CARE_PROVIDER_SITE_OTHER): Payer: Medicare Other

## 2014-02-20 DIAGNOSIS — J45909 Unspecified asthma, uncomplicated: Secondary | ICD-10-CM

## 2014-02-20 MED ORDER — OMALIZUMAB 150 MG ~~LOC~~ SOLR
375.0000 mg | Freq: Once | SUBCUTANEOUS | Status: AC
  Administered 2014-02-20: 375 mg via SUBCUTANEOUS

## 2014-02-22 ENCOUNTER — Other Ambulatory Visit: Payer: Self-pay | Admitting: Cardiology

## 2014-02-23 ENCOUNTER — Telehealth: Payer: Self-pay | Admitting: Cardiology

## 2014-02-23 NOTE — Telephone Encounter (Signed)
Spoke w/ Jasmine DecemberSharon, RN.  Will check to see if pt needs PA for Bystolic before sending.  She called in Brilinta earlier this morning.

## 2014-02-23 NOTE — Telephone Encounter (Signed)
RN spoke to son Jill Alexanders( Justin) .  RN informed Jill AlexandersJustin- that his mother's Brilinta is ready to be picked up at pharmacy . The Bystolic needs a prior auth.  RN spoke with [place] [facility]  insurance rep.  Patient has to have tried and failed 3 other medication(Metoprolol, carvedilol,or Atenolol)  prior to [employer]    RN informed son - will defer to Dr Herbie BaltimoreHarding and contact son with the answer.Jill AlexandersJustin verbalized understanding

## 2014-02-23 NOTE — Telephone Encounter (Signed)
Forward to Dr. Harding 

## 2014-02-23 NOTE — Telephone Encounter (Signed)
RN --Called and spoke to pharmacy- to see if  Medication-Brilinta need to have a prior auth. Pharmacy 's rep.states a prior auth. is not required --just need refill authorization. Authorization given refill x 11 via phone

## 2014-02-23 NOTE — Telephone Encounter (Signed)
Still have not received her Bystolic and Brilinta prescriptions.i

## 2014-02-24 ENCOUNTER — Other Ambulatory Visit: Payer: Self-pay | Admitting: *Deleted

## 2014-02-24 MED ORDER — CARVEDILOL 6.25 MG PO TABS: 6.2500 mg | ORAL_TABLET | Freq: Two times a day (BID) | ORAL | Status: DC

## 2014-02-24 NOTE — Telephone Encounter (Signed)
RN spoke to [name] Lane, information given about patient's medication  BYSTOLIC switching to Carvedilol [name] Lane is aware that his mother is to complete taking the Bystolic and then start the Carvedilol the next day. He verbalized understanding

## 2014-02-24 NOTE — Telephone Encounter (Signed)
Per Dr Herbie BaltimoreHarding  May switch Bystolic 10 mg to Carvediol 6.25 mg bid. Patient's insurance will not cover Bystolic without trying formulary alternatives Will notify patient.

## 2014-02-24 NOTE — Telephone Encounter (Signed)
E-scribed Carvedilol 6.25 mg b.i.d.x 6 refill  left message on patient's son Justin's voicemail to callback  In regards to the medication change

## 2014-02-24 NOTE — Telephone Encounter (Signed)
Prescription would go not e-scribe due to long address characters-on prescription Sp[facility] to [place] at [facility] [place] Coreg 6.25 mg bid # 60 x 6 refills

## 2014-02-25 ENCOUNTER — Telehealth (HOSPITAL_COMMUNITY): Payer: Self-pay | Admitting: *Deleted

## 2014-03-06 ENCOUNTER — Ambulatory Visit (INDEPENDENT_AMBULATORY_CARE_PROVIDER_SITE_OTHER): Payer: Medicare Other

## 2014-03-06 ENCOUNTER — Telehealth (HOSPITAL_COMMUNITY): Payer: Self-pay | Admitting: *Deleted

## 2014-03-06 DIAGNOSIS — J45909 Unspecified asthma, uncomplicated: Secondary | ICD-10-CM

## 2014-03-06 MED ORDER — OMALIZUMAB 150 MG ~~LOC~~ SOLR
375.0000 mg | Freq: Once | SUBCUTANEOUS | Status: AC
  Administered 2014-03-06: 375 mg via SUBCUTANEOUS

## 2014-03-20 ENCOUNTER — Ambulatory Visit (INDEPENDENT_AMBULATORY_CARE_PROVIDER_SITE_OTHER): Payer: Medicare Other

## 2014-03-20 DIAGNOSIS — J45909 Unspecified asthma, uncomplicated: Secondary | ICD-10-CM

## 2014-03-20 MED ORDER — OMALIZUMAB 150 MG ~~LOC~~ SOLR
375.0000 mg | Freq: Once | SUBCUTANEOUS | Status: AC
  Administered 2014-03-20: 375 mg via SUBCUTANEOUS

## 2014-04-03 ENCOUNTER — Ambulatory Visit (INDEPENDENT_AMBULATORY_CARE_PROVIDER_SITE_OTHER): Payer: Medicare Other

## 2014-04-03 DIAGNOSIS — J45909 Unspecified asthma, uncomplicated: Secondary | ICD-10-CM

## 2014-04-03 MED ORDER — OMALIZUMAB 150 MG ~~LOC~~ SOLR
375.0000 mg | Freq: Once | SUBCUTANEOUS | Status: AC
  Administered 2014-04-03: 375 mg via SUBCUTANEOUS

## 2014-04-07 ENCOUNTER — Encounter (HOSPITAL_COMMUNITY): Payer: Self-pay | Admitting: Cardiology

## 2014-04-07 ENCOUNTER — Inpatient Hospital Stay (HOSPITAL_COMMUNITY)
Admission: EM | Admit: 2014-04-07 | Discharge: 2014-04-09 | DRG: 287 | Disposition: A | Discharge status: Home / Self Care | Payer: Medicare Other | Source: Other Acute Inpatient Hospital | Attending: Internal Medicine | Admitting: Internal Medicine

## 2014-04-07 DIAGNOSIS — R0989 Other specified symptoms and signs involving the circulatory and respiratory systems: Secondary | ICD-10-CM | POA: Diagnosis present

## 2014-04-07 DIAGNOSIS — I2510 Atherosclerotic heart disease of native coronary artery without angina pectoris: Principal | ICD-10-CM | POA: Diagnosis present

## 2014-04-07 DIAGNOSIS — Z79899 Other long term (current) drug therapy: Secondary | ICD-10-CM

## 2014-04-07 DIAGNOSIS — Z9861 Coronary angioplasty status: Secondary | ICD-10-CM

## 2014-04-07 DIAGNOSIS — Z87891 Personal history of nicotine dependence: Secondary | ICD-10-CM

## 2014-04-07 DIAGNOSIS — F411 Generalized anxiety disorder: Secondary | ICD-10-CM | POA: Diagnosis present

## 2014-04-07 DIAGNOSIS — J449 Chronic obstructive pulmonary disease, unspecified: Secondary | ICD-10-CM | POA: Diagnosis present

## 2014-04-07 DIAGNOSIS — I252 Old myocardial infarction: Secondary | ICD-10-CM

## 2014-04-07 DIAGNOSIS — E785 Hyperlipidemia, unspecified: Secondary | ICD-10-CM | POA: Diagnosis present

## 2014-04-07 DIAGNOSIS — Z7982 Long term (current) use of aspirin: Secondary | ICD-10-CM

## 2014-04-07 DIAGNOSIS — I129 Hypertensive chronic kidney disease with stage 1 through stage 4 chronic kidney disease, or unspecified chronic kidney disease: Secondary | ICD-10-CM | POA: Diagnosis present

## 2014-04-07 DIAGNOSIS — Z91199 Patient's noncompliance with other medical treatment and regimen due to unspecified reason: Secondary | ICD-10-CM

## 2014-04-07 DIAGNOSIS — J4489 Other specified chronic obstructive pulmonary disease: Secondary | ICD-10-CM | POA: Diagnosis present

## 2014-04-07 DIAGNOSIS — G4733 Obstructive sleep apnea (adult) (pediatric): Secondary | ICD-10-CM | POA: Diagnosis present

## 2014-04-07 DIAGNOSIS — Z955 Presence of coronary angioplasty implant and graft: Secondary | ICD-10-CM

## 2014-04-07 DIAGNOSIS — E669 Obesity, unspecified: Secondary | ICD-10-CM

## 2014-04-07 DIAGNOSIS — I10 Essential (primary) hypertension: Secondary | ICD-10-CM | POA: Diagnosis present

## 2014-04-07 DIAGNOSIS — I359 Nonrheumatic aortic valve disorder, unspecified: Secondary | ICD-10-CM | POA: Diagnosis present

## 2014-04-07 DIAGNOSIS — Z9989 Dependence on other enabling machines and devices: Secondary | ICD-10-CM

## 2014-04-07 DIAGNOSIS — N183 Chronic kidney disease, stage 3 unspecified: Secondary | ICD-10-CM | POA: Diagnosis present

## 2014-04-07 DIAGNOSIS — Z794 Long term (current) use of insulin: Secondary | ICD-10-CM

## 2014-04-07 DIAGNOSIS — Z9119 Patient's noncompliance with other medical treatment and regimen: Secondary | ICD-10-CM

## 2014-04-07 DIAGNOSIS — K219 Gastro-esophageal reflux disease without esophagitis: Secondary | ICD-10-CM | POA: Diagnosis present

## 2014-04-07 DIAGNOSIS — E7800 Pure hypercholesterolemia, unspecified: Secondary | ICD-10-CM | POA: Diagnosis present

## 2014-04-07 DIAGNOSIS — E876 Hypokalemia: Secondary | ICD-10-CM | POA: Diagnosis present

## 2014-04-07 DIAGNOSIS — Z95820 Peripheral vascular angioplasty status with implants and grafts: Secondary | ICD-10-CM

## 2014-04-07 DIAGNOSIS — E119 Type 2 diabetes mellitus without complications: Secondary | ICD-10-CM | POA: Diagnosis present

## 2014-04-07 DIAGNOSIS — R079 Chest pain, unspecified: Secondary | ICD-10-CM

## 2014-04-07 HISTORY — DX: Nonrheumatic aortic (valve) stenosis: I35.0

## 2014-04-07 HISTORY — DX: Chronic kidney disease, stage 3 unspecified: N18.30

## 2014-04-07 HISTORY — DX: Hyperlipidemia, unspecified: E78.5

## 2014-04-07 HISTORY — DX: Atherosclerotic heart disease of native coronary artery without angina pectoris: I25.10

## 2014-04-07 HISTORY — DX: Chronic kidney disease, stage 3 (moderate): N18.3

## 2014-04-07 HISTORY — DX: Hemorrhage, not elsewhere classified: R58

## 2014-04-07 LAB — CK TOTAL AND CKMB (NOT AT ARMC)
CK TOTAL: 62 U/L (ref 7–177)
CK, MB: 2 ng/mL (ref 0.3–4.0)
Relative Index: INVALID (ref 0.0–2.5)

## 2014-04-07 LAB — PROTIME-INR
INR: 1.02 (ref 0.00–1.49)
Prothrombin Time: 13.2 seconds (ref 11.6–15.2)

## 2014-04-07 LAB — TROPONIN I: Troponin I: <0.3 ng/mL (ref <0.30)

## 2014-04-07 LAB — SEDIMENTATION RATE: Sed Rate: 23 mm/hr — ABNORMAL HIGH (ref 0–22)

## 2014-04-07 LAB — MAGNESIUM: MAGNESIUM: 1.6 mg/dL (ref 1.5–2.5)

## 2014-04-07 MED ORDER — INSULIN ASPART 100 UNIT/ML ~~LOC~~ SOLN
0.0000 [IU] | Freq: Three times a day (TID) | SUBCUTANEOUS | Status: DC
  Administered 2014-04-08: 2 [IU] via SUBCUTANEOUS
  Administered 2014-04-08: 1 [IU] via SUBCUTANEOUS
  Administered 2014-04-09: 2 [IU] via SUBCUTANEOUS

## 2014-04-07 MED ORDER — INSULIN GLARGINE 100 UNIT/ML ~~LOC~~ SOLN
20.0000 [IU] | Freq: Every day | SUBCUTANEOUS | Status: DC
  Administered 2014-04-07 – 2014-04-08 (×2): 20 [IU] via SUBCUTANEOUS
  Filled 2014-04-07 (×4): qty 0.2

## 2014-04-07 MED ORDER — BUDESONIDE 0.5 MG/2ML IN SUSP
0.5000 mg | Freq: Two times a day (BID) | RESPIRATORY_TRACT | Status: DC
Start: 2014-04-07 — End: 2014-04-09
  Administered 2014-04-07 – 2014-04-09 (×3): 0.5 mg via RESPIRATORY_TRACT
  Filled 2014-04-07 (×6): qty 2

## 2014-04-07 MED ORDER — NITROGLYCERIN 2 % TD OINT
1.0000 [in_us] | TOPICAL_OINTMENT | Freq: Four times a day (QID) | TRANSDERMAL | Status: DC
  Administered 2014-04-07 – 2014-04-09 (×7): 1 [in_us] via TOPICAL
  Filled 2014-04-07: qty 30

## 2014-04-07 MED ORDER — FLUOXETINE HCL 20 MG PO CAPS
20.0000 mg | ORAL_CAPSULE | Freq: Every day | ORAL | Status: DC
  Administered 2014-04-08 – 2014-04-09 (×2): 20 mg via ORAL
  Filled 2014-04-07 (×2): qty 1

## 2014-04-07 MED ORDER — TIOTROPIUM BROMIDE MONOHYDRATE 18 MCG IN CAPS
18.0000 ug | ORAL_CAPSULE | Freq: Every day | RESPIRATORY_TRACT | Status: DC
  Administered 2014-04-09: 18 ug via RESPIRATORY_TRACT
  Filled 2014-04-07: qty 5

## 2014-04-07 MED ORDER — METHOCARBAMOL 500 MG PO TABS
500.0000 mg | ORAL_TABLET | Freq: Three times a day (TID) | ORAL | Status: DC | PRN
  Administered 2014-04-09: 500 mg via ORAL
  Filled 2014-04-07 (×2): qty 1

## 2014-04-07 MED ORDER — AMLODIPINE BESYLATE 5 MG PO TABS
5.0000 mg | ORAL_TABLET | Freq: Every day | ORAL | Status: DC
  Administered 2014-04-08 – 2014-04-09 (×2): 5 mg via ORAL
  Filled 2014-04-07 (×2): qty 1

## 2014-04-07 MED ORDER — LEVALBUTEROL HCL 0.63 MG/3ML IN NEBU
0.6300 mg | INHALATION_SOLUTION | Freq: Four times a day (QID) | RESPIRATORY_TRACT | Status: DC
  Administered 2014-04-07 – 2014-04-08 (×2): 0.63 mg via RESPIRATORY_TRACT
  Filled 2014-04-07 (×6): qty 3

## 2014-04-07 MED ORDER — SIMVASTATIN 20 MG PO TABS
20.0000 mg | ORAL_TABLET | Freq: Every day | ORAL | Status: DC
  Administered 2014-04-07 – 2014-04-08 (×2): 20 mg via ORAL
  Filled 2014-04-07 (×3): qty 1

## 2014-04-07 MED ORDER — LORAZEPAM 2 MG/ML IJ SOLN
2.0000 mg | Freq: Once | INTRAMUSCULAR | Status: AC
  Administered 2014-04-07: 2 mg via INTRAVENOUS
  Filled 2014-04-07: qty 1

## 2014-04-07 MED ORDER — ASPIRIN EC 81 MG PO TBEC
81.0000 mg | DELAYED_RELEASE_TABLET | Freq: Every day | ORAL | Status: DC
  Administered 2014-04-08 – 2014-04-09 (×2): 81 mg via ORAL
  Filled 2014-04-07 (×2): qty 1

## 2014-04-07 MED ORDER — ASPIRIN EC 81 MG PO TBEC: 81.0000 mg | DELAYED_RELEASE_TABLET | Freq: Every day | ORAL | Status: DC

## 2014-04-07 MED ORDER — MORPHINE SULFATE 2 MG/ML IJ SOLN: 2.0000 mg | INTRAMUSCULAR | Status: DC | PRN

## 2014-04-07 MED ORDER — FUROSEMIDE 20 MG PO TABS
20.0000 mg | ORAL_TABLET | Freq: Every day | ORAL | Status: DC
  Administered 2014-04-07 – 2014-04-09 (×3): 20 mg via ORAL
  Filled 2014-04-07 (×3): qty 1

## 2014-04-07 MED ORDER — SODIUM CHLORIDE 0.9 % IV SOLN
INTRAVENOUS | Status: DC
  Administered 2014-04-07: 21:00:00 via INTRAVENOUS

## 2014-04-07 MED ORDER — NITROGLYCERIN 0.4 MG SL SUBL: 0.4000 mg | SUBLINGUAL_TABLET | SUBLINGUAL | Status: DC | PRN

## 2014-04-07 MED ORDER — ACETAMINOPHEN 325 MG PO TABS
975.0000 mg | ORAL_TABLET | Freq: Four times a day (QID) | ORAL | Status: DC | PRN
  Administered 2014-04-08 – 2014-04-09 (×3): 975 mg via ORAL
  Filled 2014-04-07 (×3): qty 3

## 2014-04-07 MED ORDER — HEPARIN (PORCINE) IN NACL 100-0.45 UNIT/ML-% IJ SOLN
1100.0000 [IU]/h | INTRAMUSCULAR | Status: DC
Start: 2014-04-07 — End: 2014-04-08
  Administered 2014-04-07: 1000 [IU]/h via INTRAVENOUS
  Filled 2014-04-07 (×2): qty 250

## 2014-04-07 MED ORDER — ALBUTEROL SULFATE (2.5 MG/3ML) 0.083% IN NEBU: 2.5000 mg | INHALATION_SOLUTION | Freq: Four times a day (QID) | RESPIRATORY_TRACT | Status: DC | PRN

## 2014-04-07 MED ORDER — LORATADINE 10 MG PO TABS
10.0000 mg | ORAL_TABLET | Freq: Every day | ORAL | Status: DC
  Administered 2014-04-08 – 2014-04-09 (×2): 10 mg via ORAL
  Filled 2014-04-07 (×2): qty 1

## 2014-04-07 MED ORDER — TICAGRELOR 90 MG PO TABS
90.0000 mg | ORAL_TABLET | Freq: Two times a day (BID) | ORAL | Status: DC
  Administered 2014-04-07 – 2014-04-09 (×4): 90 mg via ORAL
  Filled 2014-04-07 (×5): qty 1

## 2014-04-07 MED ORDER — ONDANSETRON HCL 4 MG/2ML IJ SOLN
4.0000 mg | Freq: Once | INTRAMUSCULAR | Status: AC
  Administered 2014-04-07: 4 mg via INTRAVENOUS
  Filled 2014-04-07: qty 2

## 2014-04-07 MED ORDER — ASPIRIN 300 MG RE SUPP: 300.0000 mg | RECTAL | Status: AC

## 2014-04-07 MED ORDER — CARVEDILOL 6.25 MG PO TABS
6.2500 mg | ORAL_TABLET | Freq: Two times a day (BID) | ORAL | Status: DC
  Administered 2014-04-07 – 2014-04-08 (×3): 6.25 mg via ORAL
  Filled 2014-04-07 (×6): qty 1

## 2014-04-07 MED ORDER — HEPARIN BOLUS VIA INFUSION
4000.0000 [IU] | Freq: Once | INTRAVENOUS | Status: AC
  Administered 2014-04-07: 4000 [IU] via INTRAVENOUS
  Filled 2014-04-07: qty 4000

## 2014-04-07 MED ORDER — LORAZEPAM 1 MG PO TABS
1.0000 mg | ORAL_TABLET | Freq: Every day | ORAL | Status: DC
  Administered 2014-04-07 – 2014-04-08 (×2): 1 mg via ORAL
  Filled 2014-04-07 (×2): qty 1

## 2014-04-07 MED ORDER — ASPIRIN 81 MG PO CHEW
324.0000 mg | CHEWABLE_TABLET | ORAL | Status: AC
  Filled 2014-04-07: qty 4

## 2014-04-07 NOTE — H&P (Signed)
Pt. Seen and examined. Agree with the NP/PA-C note as written.  Very pleasant, but anxious 73 yo female with history of CAD and recent PCI to the prox-mid RCA in 12/29/13. She is on brillinta and low dose ASA. She apparently fell last week by her bed and continues to have right-sided chest wall pain. She is now reporting left-sided chest pain and presented to [facility].  POC Troponin was mildly elevated there and she was transferred to [facility]. During my exam in the room she was markedly anxious, complaining of left neck pain and shoulder pain, she was tachypeic and shaky.  EKG shows normal sinus without ischemic changes. Labs are pending.  Agree with checking cardiac enyzymes - heparinize and treat for ACS. Check rib series on the right for persistent tenderness after fall to r/o rib fracture. Give ativan 2 mg IV x 1 now for anxiety. Nitrates for chest pain relief.  Keep NPO p MN for possible cath tomorrow based on CE's and clinical symptoms.  Chrystie Nose, MD, [facility] Attending Cardiologist [facility]

## 2014-04-07 NOTE — Progress Notes (Signed)
ANTICOAGULATION CONSULT NOTE - Initial Consult  Pharmacy Consult for Heparin Indication: chest pain/ACS  Allergies  Allergen Reactions  . Atorvastatin Other (See Comments)    REACTION: muscle aches  . Benzonatate Other (See Comments)    REACTION: confusion    Patient Measurements: Height: 5\' 4"  (162.6 cm) Weight: 190 lb 8 oz (86.41 kg) IBW/kg (Calculated) : 54.7 Heparin Dosing Weight:  73.5 kg  Vital Signs: Temp: 98.2 F (36.8 C) (06/09 1829) Temp src: Oral (06/09 1829) BP: 152/78 mmHg (06/09 1903) Pulse Rate: 83 (06/09 1903)  Labs: No results found for this basename: HGB, HCT, PLT, APTT, LABPROT, INR, HEPARINUNFRC, CREATININE, CKTOTAL, CKMB, TROPONINI,  in the last 72 hours  Estimated Creatinine Clearance: 37.5 ml/min (by C-G formula based on Cr of 1.4).   Medical History: Past Medical History  Diagnosis Date  . Non-STEMI (non-ST elevated myocardial infarction) 2001; March 2015    2001: Treated medically because of his markedly; 50-60% LAD;; 2015 - mild positive troponins with positive nuclear stress test  . CAD S/P percutaneous coronary angioplasty 2003, March 2015    2003 - BMS to RCA and LAD; PTCA of ISR later in 2003; 12/2013 -  staged DES PCI of the D1 and mid RCA  . Pure hypercholesterolemia   . Hypertension   . Type 2 diabetes mellitus treated with insulin   . COPD (chronic obstructive pulmonary disease)     With chronic dyspnea  . Unspecified asthma(493.90)   . Obstructive sleep apnea   . Edema   . GE reflux   . Carotid bruit   . Anxiety   . CKD (chronic kidney disease) stage 4, GFR 15-29 ml/min, post cath 01/06/2014    Medications:  Prescriptions prior to admission  Medication Sig Dispense Refill  . acetaminophen (TYLENOL) 325 MG tablet Take 975 mg by mouth every 6 (six) hours as needed for moderate pain.      Marland Kitchen albuterol (PROVENTIL) (2.5 MG/3ML) 0.083% nebulizer solution Take 2.5 mg by nebulization every 6 (six) hours as needed for wheezing or  shortness of breath.      . Albuterol Sulfate (PROAIR HFA IN) Inhale 2 puffs into the lungs every 4 (four) hours as needed.      Marland Kitchen amLODipine (NORVASC) 5 MG tablet Take 5 mg by mouth daily.      Marland Kitchen aspirin 81 MG tablet Take 81 mg by mouth daily.        Marland Kitchen BRILINTA 90 MG TABS tablet TAKE 1 TABLET BY MOUTH TWICE DAILY  60 tablet  11  . budesonide (PULMICORT) 0.5 MG/2ML nebulizer solution Take 0.5 mg by nebulization 2 (two) times daily.      . carvedilol (COREG) 6.25 MG tablet Take 1 tablet (6.25 mg total) by mouth 2 (two) times daily.  60 tablet  6  . cetirizine (ZYRTEC) 10 MG tablet Take 10 mg by mouth daily.        Marland Kitchen FLUoxetine (PROZAC) 20 MG tablet Take 20 mg by mouth daily.       . furosemide (LASIX) 20 MG tablet 1-2 tablets daily      . insulin glargine (LANTUS) 100 UNIT/ML injection Inject 30 Units into the skin at bedtime.       . insulin lispro (HUMALOG) 100 UNIT/ML injection Inject 0-12 Units into the skin 3 (three) times daily with meals. If BG less than 150, no insulin given 151-200, takes 3 units 201-250, takes 5 units  251-300, takes 8 units 301-350, takes 10 units 351-400, takes  12 units 400+, call MD      . LORazepam (ATIVAN) 1 MG tablet Take 1 mg by mouth at bedtime.       . methocarbamol (ROBAXIN) 500 MG tablet Take 500 mg by mouth every 8 (eight) hours as needed for muscle spasms.      . nitroGLYCERIN (NITROSTAT) 0.4 MG SL tablet Place 1 tablet (0.4 mg total) under the tongue every 5 (five) minutes as needed for chest pain.  25 tablet  6  . NON FORMULARY Inject 2-3 each into the muscle every Friday. "allergy shots-[name]"      . pravastatin (PRAVACHOL) 40 MG tablet Take 1 tablet (40 mg total) by mouth daily.  30 tablet  1  . ranitidine (ZANTAC) 150 MG tablet Take 150 mg by mouth every morning.       . tiotropium (SPIRIVA) 18 MCG inhalation capsule Place 18 mcg into inhaler and inhale daily.        Assessment: 73 y/o F transferred from [facility] with CP. Last cath  March 2015 revealed severe 2VCAD.  PCI was performed on the Diagonal 90-95% lesion and PCI/DES of the proximal-mid RCA.  Last week pt had a fall with R sided pain with movement and L sided chest heaviness with nausea and chills. Troponin was elevated at [facility] and Scr 1.6, Hgb 12.7.  Anticoagulation: Start IV heparin for CP/ACS  Cardiovascular: S/P coronary artery stent placement 01/02/14 stent to diag.Promus Premier and staged DES to prox mid RCA, HTN, HLD,   Endocrinology: DM  Gastrointestinal / Nutrition: GERD  Neurology: anxiety, having some musculoskeletal pain from fall at home.  Nephrology: CKD4,   Pulmonary: COPD, OSA  Hematology / Oncology  PTA Medication Issues: f/u med rec  Best Practices   Goal of Therapy:  Heparin level 0.3-0.7 units/ml Monitor platelets by anticoagulation protocol: Yes   Plan:  Heparin 4000 units IV bolus and infusion 1000 units/hr Daily heparin level and CBC  Kiona Blume S. Merilynn Finlandobertson, PharmD, [facility] Clinical Staff Pharmacist Pager [phone]  Carollee Massedrystal Stillinger Merilynn Finlandobertson 04/07/2014,7:52 PM

## 2014-04-07 NOTE — H&P (Signed)
[name] Lane is an 73 y.o. female.    Primary Cardiologist:Dr. Herbie BaltimoreHarding PCP: Pricilla HolmSHARPE, LESLIE M, MD  Chief Complaint: Chest pain, combination of angina and muscular skeletal  HPI: [name] LavCarolyn F Grosso is a 73 y.o. female with a history of HTN, HLD, CAD s/p remote stenting, asthmatic COPD, OSA, DM, and CKD who was admitted on 12/30/13 for chest pain. She had an abnormal nuclear stress test suggesting distal anterolateral ischemia and 4th troponin .8. Heart cath on 3/7 revealed severe 2 vessel CAD with 95% proximal D1 lesion -os consistent with the stress test, and therefore the culprit lesion. She was found to have 95% diagonal lesion as well as 80% RCA lesion. D2 desire to conserve contrast, single-vessel PCI only was performed to be more severe severe diagonal branch. Due to concern for reduced renal function, left ventriculography was not performed on 3/6. She was found to have significant 2 Vessel disease. Due to her renal insufficiency, only single vessel PCI was performed on the Diagonal 90-95% lesion. She returned for planned staged PCI of the proximal-mid RCA 12/29/13 and is now s/p PCI with DES to prox-mid RCA.  She was seen by Dr. Herbie BaltimoreHarding on 02/12/14 and was doing well with only occ chest twinge.  She was back to her exertional dyspnea from COPD.  She is on Brilinta and low dose ASA.  She did well until last week and fell by her bed.  She did not pass out but does not know why she fell.  Her family picked her up with their arms around her chest and since then she has had rt sided pain with movement but over the last 3 days she also has Lt side chest heaviness, along with nausea and occ chills.  She has not slept much due to both types of pain.  [facility] and CXR with low volume and bibasilar atelectasis. No mention of ribs.  She does have a harsh cough but no production of sputum. Pt did not think to take NTG.  She does have a history of asthma and COPD but stopped  tobacco many years ago.  She states she takes her meds regularly.   [facility].  Pro bnp was 683. H/H 12.7/39; WBC 9.5, Na 140, K+ 3.7 Cl101, CO2 22, BUN 13, Cr 1.6, glucose 295--she rec'd [place]ol treatment in [place].    On arrival here she is now having recurrent lt ant chest pressure.  EKG ordered, will recheck troponin and CKMB.      Past Medical History  Diagnosis Date  . Non-STEMI (non-ST elevated myocardial infarction) 2001; March 2015    2001: Treated medically because of his markedly; 50-60% LAD;; 2015 - mild positive troponins with positive nuclear stress test  . CAD S/P percutaneous coronary angioplasty 2003, March 2015    2003 - BMS to RCA and LAD; PTCA of ISR later in 2003; 12/2013 -  staged DES PCI of the D1 and mid RCA  . Pure hypercholesterolemia   . Hypertension   . Type 2 diabetes mellitus treated with insulin   . COPD (chronic obstructive pulmonary disease)     With chronic dyspnea  . Unspecified asthma(493.90)   . Obstructive sleep apnea   . Edema   . GE reflux   . Carotid bruit   . Anxiety   . CKD (chronic kidney disease) stage 4, GFR 15-29 ml/min, post cath 01/06/2014  Past Surgical History  Procedure Laterality Date  . Appendectomy    . Cardiac catheterization  11/18/1999    Small non-STEMI; moderate 50-60% RCA lesion treated medically secondary to percutaneous RP bleed  . Percutaneous coronary stent intervention (pci-s)  02/17/2002    Two-vessel PCI: 80% mid LAD -- 2.25 mm x 8 mm Express 2 BMS; in RCA RCA 80% 3.5 mm x 12 mm Express 2 BMS  . Coronary angioplasty   04/29/2002    Cutting Balloon PTCA of LAD stent for ISR with 2.5 mm balloon  . Cardiac catheterization  April 2004, October 2010, December 2011    Stable CAD with mild ISR in LAD and RCA; treated medically  . Cardiac catheterization  01/04/2012    4 small non-STEMI; Myoview with lateral ischemia-- 95% proximal D1, 80% mid RCA post stent -- staged PCI    . Percutaneous coronary stent intervention (pci-s)   03/07 & 06/2012    7th - Two-vessel PCI: 80% mid LAD -- 2.25 mm x 8 mm Express 2 BMS; in RCA RCA 80% 3.5 mm x 12 mm Express 2 BMS; 9th - mid RCA a Xience alpine DES 3.5 mm x 15 mm (postdilated 3.5 mm)   . Transthoracic echocardiogram   01/02/2014    (Most recent) moderate concentric LVH. Normal EF of 65-70%. Grade 1 diastolic dysfunction. Mild AI  . Nm myoview ltd  01/01/2014    EF 64%. The distal anterior/anterolateral ischemia -- referred for cath    Family History  Problem Relation Age of Onset  . Breast cancer Mother   . Heart disease     Social History:  reports that she quit smoking about 14 years ago. She does not have any smokeless tobacco history on file. She reports that she does not drink alcohol or use illicit drugs.  Allergies:  Allergies  Allergen Reactions  . Atorvastatin Other (See Comments)    REACTION: muscle aches  . Benzonatate Other (See Comments)    REACTION: confusion    Medications Prior to Admission  Medication Sig Dispense Refill  . acetaminophen (TYLENOL) 325 MG tablet Take 975 mg by mouth every 6 (six) hours as needed for moderate pain.      Marland Kitchen albuterol (PROVENTIL) (2.5 MG/3ML) 0.083% nebulizer solution Take 2.5 mg by nebulization every 6 (six) hours as needed for wheezing or shortness of breath.      . Albuterol Sulfate (PROAIR HFA IN) Inhale 2 puffs into the lungs every 4 (four) hours as needed.      Marland Kitchen amLODipine (NORVASC) 5 MG tablet Take 5 mg by mouth daily.      Marland Kitchen aspirin 81 MG tablet Take 81 mg by mouth daily.        Marland Kitchen BRILINTA 90 MG TABS tablet TAKE 1 TABLET BY MOUTH TWICE DAILY  60 tablet  11  . budesonide (PULMICORT) 0.5 MG/2ML nebulizer solution Take 0.5 mg by nebulization 2 (two) times daily.      . carvedilol (COREG) 6.25 MG tablet Take 1 tablet (6.25 mg total) by mouth 2 (two) times daily.  60 tablet  6  . cetirizine (ZYRTEC) 10 MG tablet Take 10 mg by mouth daily.        Marland Kitchen FLUoxetine  (PROZAC) 20 MG tablet Take 20 mg by mouth daily.       . furosemide (LASIX) 20 MG tablet 1-2 tablets daily      . insulin glargine (LANTUS) 100 UNIT/ML injection Inject 30 Units into the skin at bedtime.       Marland Kitchen  insulin lispro (HUMALOG) 100 UNIT/ML injection Inject 0-12 Units into the skin 3 (three) times daily with meals. If BG less than 150, no insulin given 151-200, takes 3 units 201-250, takes 5 units  251-300, takes 8 units 301-350, takes 10 units 351-400, takes 12 units 400+, call MD      . LORazepam (ATIVAN) 1 MG tablet Take 1 mg by mouth at bedtime.       . methocarbamol (ROBAXIN) 500 MG tablet Take 500 mg by mouth every 8 (eight) hours as needed for muscle spasms.      . nitroGLYCERIN (NITROSTAT) 0.4 MG SL tablet Place 1 tablet (0.4 mg total) under the tongue every 5 (five) minutes as needed for chest pain.  25 tablet  6  . NON FORMULARY Inject 2-3 each into the muscle every Friday. "allergy shots-[name]"      . pravastatin (PRAVACHOL) 40 MG tablet Take 1 tablet (40 mg total) by mouth daily.  30 tablet  1  . ranitidine (ZANTAC) 150 MG tablet Take 150 mg by mouth every morning.       . tiotropium (SPIRIVA) 18 MCG inhalation capsule Place 18 mcg into inhaler and inhale daily.        No results found for this or any previous visit (from the past 48 hour(s)). No results found.  ROS: General:no colds or fevers- + chills, no weight changes Skin:no rashes or ulcers HEENT:no blurred vision, no congestion CV:see HPI PUL:see HPI ZO:XWRU diarrhea type stools, no constipation or melena, no indigestion GU:no hematuria, no dysuria MS:no joint pain, no claudication Neuro:no syncope, occ lightheadedness, fell 1 week ago she is not sure why denies passing out.  Endo:no diabetes, no thyroid disease   Blood pressure 143/98, pulse 91, temperature 98.2 F (36.8 C), temperature source Oral, resp. rate 18, height 5\' 4"  (1.626 m), weight 190 lb 8 oz (86.41 kg). PE: General:Pleasant affect,  NAD Skin:Warm and dry, brisk capillary refill HEENT:normocephalic, sclera clear, mucus membranes moist Neck:supple, no JVD, no bruits  Heart:S1S2 RRR without murmur, gallup, rub or click Chest wall- rt side with + tenderness to palpation, Lt side mild increase of tightness with palpation Lungs:diminished, tight without rales, rhonchi, or wheezes EAV:WUJW, non tender, + BS, do not palpate liver spleen or masses Ext:no lower ext edema, 2+ pedal pulses, 2+ radial pulses Neuro:alert and oriented, MAE, follows commands, + facial symmetry    Assessment/Plan Principal Problem:   Chest pain with moderate risk of acute coronary syndrome mild troponin bump at [facility], serial enzymes here will add heparin, and if relief with sl NTG then add IVNTG Active Problems:   S/P coronary artery stent placement 01/02/14 stent to diag.Promus Premier and staged DES to prox mid RCA   HYPERCHOLESTEROLEMIA  IIA   Anxiety state, unspecified   HYPERTENSION   CAD - remote LAD and RCA stenting, last cath 12/11- 20% LAD, 40% RCA   C O P D- possible exacerbation vs bronchitis   Dyspnea- she has chronically   Chest wall pain on rt- treat with pain meds.  May need cardiac cath in AM, if pain continues will transfer to stepdown.   Nada Boozer Nurse Practitioner Certified [facility] Pager [phone] or after 5pm or weekends call [phone] 04/07/2014, 6:50 PM

## 2014-04-08 ENCOUNTER — Inpatient Hospital Stay (HOSPITAL_COMMUNITY): Payer: Medicare Other

## 2014-04-08 DIAGNOSIS — I2510 Atherosclerotic heart disease of native coronary artery without angina pectoris: Principal | ICD-10-CM

## 2014-04-08 DIAGNOSIS — E119 Type 2 diabetes mellitus without complications: Secondary | ICD-10-CM

## 2014-04-08 LAB — BASIC METABOLIC PANEL
BUN: 16 mg/dL (ref 6–23)
CO2: 24 mEq/L (ref 19–32)
Calcium: 9 mg/dL (ref 8.4–10.5)
Chloride: 102 mEq/L (ref 96–112)
Creatinine, Ser: 1.29 mg/dL — ABNORMAL HIGH (ref 0.50–1.10)
GFR, EST AFRICAN AMERICAN: 46 mL/min — AB (ref >90)
GFR, EST NON AFRICAN AMERICAN: 40 mL/min — AB (ref >90)
GLUCOSE: 169 mg/dL — AB (ref 70–99)
POTASSIUM: 3.5 meq/L — AB (ref 3.7–5.3)
SODIUM: 142 meq/L (ref 137–147)

## 2014-04-08 LAB — CBC
HCT: 35.6 % — ABNORMAL LOW (ref 36.0–46.0)
HCT: 34.2 % — ABNORMAL LOW (ref 36.0–46.0)
HEMOGLOBIN: 11.5 g/dL — AB (ref 12.0–15.0)
HEMOGLOBIN: 11.2 g/dL — AB (ref 12.0–15.0)
MCH: 26.3 pg (ref 26.0–34.0)
MCH: 26.9 pg (ref 26.0–34.0)
MCHC: 32.3 g/dL (ref 30.0–36.0)
MCHC: 32.7 g/dL (ref 30.0–36.0)
MCV: 81.3 fL (ref 78.0–100.0)
MCV: 82.2 fL (ref 78.0–100.0)
PLATELETS: 228 10*3/uL (ref 150–400)
Platelets: 256 10*3/uL (ref 150–400)
RBC: 4.38 MIL/uL (ref 3.87–5.11)
RBC: 4.16 MIL/uL (ref 3.87–5.11)
RDW: 14 % (ref 11.5–15.5)
RDW: 14.2 % (ref 11.5–15.5)
WBC: 10.3 10*3/uL (ref 4.0–10.5)
WBC: 9.5 10*3/uL (ref 4.0–10.5)

## 2014-04-08 LAB — GLUCOSE, CAPILLARY
GLUCOSE-CAPILLARY: 147 mg/dL — AB (ref 70–99)
Glucose-Capillary: 179 mg/dL — ABNORMAL HIGH (ref 70–99)
Glucose-Capillary: 134 mg/dL — ABNORMAL HIGH (ref 70–99)
Glucose-Capillary: 174 mg/dL — ABNORMAL HIGH (ref 70–99)

## 2014-04-08 LAB — TROPONIN I: Troponin I: <0.3 ng/mL (ref <0.30)

## 2014-04-08 LAB — HEPARIN LEVEL (UNFRACTIONATED)
Heparin Unfractionated: 0.53 IU/mL (ref 0.30–0.70)
Heparin Unfractionated: 0.46 IU/mL (ref 0.30–0.70)
Heparin Unfractionated: 0.25 IU/mL — ABNORMAL LOW (ref 0.30–0.70)

## 2014-04-08 MED ORDER — PREDNISONE 5 MG PO TABS
5.0000 mg | ORAL_TABLET | Freq: Every day | ORAL | Status: DC
  Filled 2014-04-08 (×2): qty 1

## 2014-04-08 MED ORDER — HEPARIN (PORCINE) IN NACL 100-0.45 UNIT/ML-% IJ SOLN
1100.0000 [IU]/h | INTRAMUSCULAR | Status: DC
  Administered 2014-04-08: 1100 [IU]/h via INTRAVENOUS
  Filled 2014-04-08: qty 250

## 2014-04-08 MED ORDER — LEVALBUTEROL HCL 0.63 MG/3ML IN NEBU: 0.6300 mg | INHALATION_SOLUTION | Freq: Four times a day (QID) | RESPIRATORY_TRACT | Status: DC | PRN

## 2014-04-08 NOTE — Progress Notes (Signed)
Patient/family refusing heparin gtt, Pt stated she had a peritoneal bleed in March 2015 and was able to receive something other than heparin. RN educated patient on the importance of continuing the heparin gtt, pt/family insisted on stopping the drip. RN stopped heparin drip @ 2235. MD M.Whitlock made aware that heparin is stopped and no new orders given at this time due to cath in the am. Pharmacist also aware that patient refusing heparin. Will continue to monitor patient.

## 2014-04-08 NOTE — Progress Notes (Signed)
Patient c/o chest pressure, Rt leg pain and shortness of breath. Pt/family believed it's r/t heparin. Heparin paused 9:35pm. Pt symptoms subsided 9:45pm.  Vitals and EKG normal. MD M.Whitlock made aware and ordered to restart heparin, does not believe pt symptoms are related to heparin infusion. Patient stable and family at bedside. Pt instructed to call RN if symptoms return. Will Continue to monitor patient.

## 2014-04-08 NOTE — Progress Notes (Signed)
Hep gtt was turned OFF from 14:45-19:35 due to development of large bruises on lower legs and pt being in acute distress/pain that began in legs and went up to abdomen.  CBC was drawn and after speaking with Robbie Lis, PA, hep gtt restarted at 11cc/hr at 19:35  Pharmacy notified. Will continue to monitor pt.  Pt agrees to call RN with any further increased pain/distress.

## 2014-04-08 NOTE — Progress Notes (Signed)
Patient Profile: [name] Lane is a 73 y.o. female with a history of HTN, HLD, CAD and renal insufficiency, last admitted in March of this year for chest pain. LHC revealed severe 2 vessel CAD with a 95% diagonal lesion as well as 80% RCA lesion. Due to renal insufficiency, she underwent staged intervention: s/p DES to diagonal and DES to proximal-mid RCA. On DAPT w/ ASA + Brilinta. She apparently fell last week by her bed and initially endorsed right-sided chest wall pain. However, she developed left-sided chest pain and presented to [facility]. POC Troponin was mildly elevated there and she was transferred to [facility].   Cardiac Enzymes at [facility]: 0.072 Cardiac Enzymes at [facility]: Negative x 2 EKG: Nonischemic DG Ribs: Pending   Subjective: Currently CP free. She states her pain resolved after taking NTG.   Objective: Vital signs in last 24 hours: Temp:  [98.2 F (36.8 C)-98.8 F (37.1 C)] 98.8 F (37.1 C) (06/10 0500) Pulse Rate:  [83-91] 91 (06/10 0500) Resp:  [18-20] 20 (06/10 0500) BP: (138-152)/(61-98) 138/61 mmHg (06/10 0500) SpO2:  [96 %-98 %] 97 % (06/10 0500) Weight:  [189 lb 3.2 oz (85.821 kg)-190 lb 8 oz (86.41 kg)] 189 lb 3.2 oz (85.821 kg) (06/10 0540) Last BM Date: 04/07/14  Intake/Output from previous day: 06/09 0701 - 06/10 0700 In: -  Out: 950 [Urine:950] Intake/Output this shift:    Medications Current Facility-Administered Medications  Medication Dose Route Frequency Provider Last Rate Last Dose  . 0.9 %  sodium chloride infusion   Intravenous Continuous Cecilie Kicks, NP 10 mL/hr at 04/07/14 2030    . acetaminophen (TYLENOL) tablet 975 mg  975 mg Oral Q6H PRN Cecilie Kicks, NP      . albuterol (PROVENTIL) (2.5 MG/3ML) 0.083% nebulizer solution 2.5 mg  2.5 mg Inhalation Q6H PRN Cecilie Kicks, NP      . amLODipine (NORVASC) tablet 5 mg  5 mg Oral Daily Cecilie Kicks, NP      . aspirin chewable tablet 324 mg  324 mg Oral NOW Cecilie Kicks, NP       Or  .  aspirin suppository 300 mg  300 mg Rectal NOW Cecilie Kicks, NP      . aspirin EC tablet 81 mg  81 mg Oral Daily Cecilie Kicks, NP      . budesonide (PULMICORT) nebulizer solution 0.5 mg  0.5 mg Nebulization BID Cecilie Kicks, NP   0.5 mg at 04/07/14 2111  . carvedilol (COREG) tablet 6.25 mg  6.25 mg Oral BID WC Cecilie Kicks, NP   6.25 mg at 04/07/14 2045  . FLUoxetine (PROZAC) capsule 20 mg  20 mg Oral Daily Cecilie Kicks, NP      . furosemide (LASIX) tablet 20 mg  20 mg Oral Daily Cecilie Kicks, NP   20 mg at 04/07/14 2047  . heparin ADULT infusion 100 units/mL (25000 units/250 mL)  1,000 Units/hr Intravenous Continuous Crystal Avanell Shackleton, RPH 10 mL/hr at 04/07/14 2122 1,000 Units/hr at 04/07/14 2122  . insulin aspart (novoLOG) injection 0-9 Units  0-9 Units Subcutaneous TID WC Cecilie Kicks, NP      . insulin glargine (LANTUS) injection 20 Units  20 Units Subcutaneous QHS Cecilie Kicks, NP   20 Units at 04/07/14 2243  . levalbuterol (XOPENEX) nebulizer solution 0.63 mg  0.63 mg Nebulization Q6H PRN Pixie Casino, MD      . loratadine (CLARITIN) tablet 10 mg  10 mg Oral Daily Cecilie Kicks, NP      .  LORazepam (ATIVAN) tablet 1 mg  1 mg Oral QHS Cecilie Kicks, NP   1 mg at 04/07/14 2245  . methocarbamol (ROBAXIN) tablet 500 mg  500 mg Oral Q8H PRN Cecilie Kicks, NP      . morphine 2 MG/ML injection 2 mg  2 mg Intravenous Q2H PRN Cecilie Kicks, NP      . nitroGLYCERIN (NITROGLYN) 2 % ointment 1 inch  1 inch Topical 4 times per day Cecilie Kicks, NP   1 inch at 04/08/14 0531  . nitroGLYCERIN (NITROSTAT) SL tablet 0.4 mg  0.4 mg Sublingual Q5 min PRN Cecilie Kicks, NP      . simvastatin (ZOCOR) tablet 20 mg  20 mg Oral q1800 Cecilie Kicks, NP   20 mg at 04/07/14 2046  . ticagrelor (BRILINTA) tablet 90 mg  90 mg Oral BID Cecilie Kicks, NP   90 mg at 04/07/14 2049  . tiotropium (SPIRIVA) inhalation capsule 18 mcg  18 mcg Inhalation Daily Cecilie Kicks, NP        PE: General appearance: alert,  cooperative and no distress Lungs: clear to auscultation bilaterally Heart: regular rate and rhythm and 2/6 SM Extremities: Trace LEE Pulses: 2+ bilateral LEE Skin: warm and dry Neurologic: Grossly normal  Lab Results:   Recent Labs  04/08/14 0120  WBC 10.3  HGB 11.5*  HCT 35.6*  PLT 256   BMET No results found for this basename: NA, K, CL, CO2, GLUCOSE, BUN, CREATININE, CALCIUM,  in the last 72 hours PT/INR  Recent Labs  04/07/14 2019  LABPROT 13.2  INR 1.02   Cardiac Panel (last 3 results)  Recent Labs  04/07/14 2019 04/08/14 0120  CKTOTAL 54  --   CKMB 2.0  --   TROPONINI <0.30 <0.30  RELINDX RELATIVE INDEX IS INVALID  --      Assessment/Plan  Principal Problem:   Chest pain with moderate risk of acute coronary syndrome Active Problems:   HYPERCHOLESTEROLEMIA  IIA   Anxiety state, unspecified   HYPERTENSION   CAD - remote LAD and RCA stenting, last cath 12/11- 20% LAD, 40% RCA   C O P D   Dyspnea   S/P coronary artery stent placement 01/02/14 stent to diag.Promus Premier and staged DES to prox mid RCA   Chest pain, moderate coronary artery risk  1. Chest Pain: ? Cardiac vs Non cardiac. Troponin at outside hospital was slightly elevated at 0.072. Cardiac enzymes here at [facility] have been negative. Total CK and CK MB also normal. ESR however was slightly elevated at 23. EKG is non ischemic, however her left-sided chest pain was relieved with NTG. CXR to assess ribs pending to see if patient suffered a fracture from her recent fall. ? Ischemic eval with a NST. She has been NPO since midnight.  2. CAD: s/p PCI + DES to diagonal and RCA. Currently CP free on IV heparin + nitro patch. ?NST to assess for ischemia. Keep NPO until seen by MD. Continue DAPT w/ ASA + Brilinta, BB and statin. No ACE given her CKD.   3. CKD: ? SCr. No BMP has been ordered. Will obtain today.   4. HTN: BP controlled. Continue amlodipine and Coreg.   5: HLD: Continue statin.    LOS: 1  day    Avrian Delfavero M. [name] Lane 04/08/2014 7:47 AM

## 2014-04-08 NOTE — Progress Notes (Addendum)
ANTICOAGULATION CONSULT NOTE - Follow Up Consult  Pharmacy Consult for Heparin  Indication: chest pain/ACS  Allergies  Allergen Reactions  . Atorvastatin Other (See Comments)    REACTION: muscle aches  . Benzonatate Other (See Comments)    REACTION: confusion   Patient Measurements: Height: 5\' 4"  (162.6 cm) Weight: 190 lb 8 oz (86.41 kg) IBW/kg (Calculated) : 54.7 Heparin Dosing Weight: 73.5 kg  Vital Signs: Temp: 98.2 F (36.8 C) (06/09 1829) Temp src: Oral (06/09 1829) BP: 152/78 mmHg (06/09 1903) Pulse Rate: 83 (06/09 1903)  Labs:  Recent Labs  04/07/14 2019 04/08/14 0120  HGB  --  11.5*  HCT  --  35.6*  PLT  --  256  LABPROT 13.2  --   INR 1.02  --   HEPARINUNFRC  --  0.53  CKTOTAL 62  --   CKMB 2.0  --   TROPONINI <0.30  --     Estimated Creatinine Clearance: 37.5 ml/min (by C-G formula based on Cr of 1.4).   Medications:  Heparin 1000 units/hr  Assessment: 73 y/o F on heparin for CP. First HL of 0.53 was only a four hour level.   Goal of Therapy:  Heparin level 0.3-0.7 units/ml Monitor platelets by anticoagulation protocol: Yes  Plan:  Re-time HL for 0500  Addendum 6:43 AM HL this AM is 0.46  Plan:  -Continue heparin at 1000 units/hr -1400 HL to confirm -Daily CBC/HL -Monitor for bleeding  Abran Duke 04/08/2014,2:16 AM

## 2014-04-08 NOTE — Progress Notes (Signed)
ANTICOAGULATION CONSULT NOTE - Follow Up Consult  Pharmacy Consult for Heparin  Indication: chest pain/ACS  Allergies  Allergen Reactions  . Atorvastatin Other (See Comments)    REACTION: muscle aches  . Benzonatate Other (See Comments)    REACTION: confusion   Patient Measurements: Height: 5\' 4"  (162.6 cm) Weight: 189 lb 3.2 oz (85.821 kg) IBW/kg (Calculated) : 54.7 Heparin Dosing Weight: 73.5 kg  Vital Signs: Temp: 98.5 F (36.9 C) (06/10 1426) Temp src: Oral (06/10 1426) BP: 125/62 mmHg (06/10 1426) Pulse Rate: 92 (06/10 1426)  Labs:  Recent Labs  04/07/14 2019 04/08/14 0120 04/08/14 0517 04/08/14 0718 04/08/14 1420  HGB  --  11.5*  --   --   --   HCT  --  35.6*  --   --   --   PLT  --  256  --   --   --   LABPROT 13.2  --   --   --   --   INR 1.02  --   --   --   --   HEPARINUNFRC  --  0.53 0.46  --  0.25*  CREATININE  --   --   --  1.29*  --   CKTOTAL 62  --   --   --   --   CKMB 2.0  --   --   --   --   TROPONINI <0.30 <0.30  --  <0.30  --     Estimated Creatinine Clearance: 40.5 ml/min (by C-G formula based on Cr of 1.29).   Medications:  Heparin 1000 units/hr  Assessment: 73 y/o F on heparin for CP. Heparin level is slightly low at 0.25. Heparin was briefly off this afternoon when bruising was noticed. Provider to evaluate later today. H/H is slightly low but platelets are WNL. Plan to continue heparin for now with plans for cath in AM. Will follow-up assessment of bruising on leg per provider.   Goal of Therapy:  Heparin level 0.3-0.7 units/ml Monitor platelets by anticoagulation protocol: Yes  Plan:  1. Increase heparin to 1100 units/hr (conservative dosing d/t bruising and heparin being off for a short period this afternoon) 2. Check an 8 hour heparin level 3. Continue daily heparin level and CBC 4. F/u assessment of bruising and possible changes to plan  Lysle Pearl, PharmD, BCPS Pager # [phone] 04/08/2014 3:08 PM

## 2014-04-08 NOTE — Progress Notes (Signed)
Family at bedside concerned about discoloration to bilateral legs, covering about 2/3 of skin on anterior legs/shins.  Purplish/reddish in color; pt states that it feels "sore" and she did feel a sharp pain shooting up thru RT leg.  Brittainy, PA notified.  Situation was discussed with pharmacist on 3W who stated that PLT levels were normal.  Hep gtt stopped until Brittainy can assess the situation and see pt.  Will continue to monitor.

## 2014-04-08 NOTE — Progress Notes (Addendum)
This is a difficult clinical situation. Hes two types of discomfort. Right lower chest started after a fall occurring recently. The other is a left chest tightness that is associated with dyspnea. This discomfort went away completely after PCI in March. Over the past 4-6 weeks she has recurrent discomfort and limitation in exertional tolerance. Had Mid RCA DEC in 3/15. Nuclear may be associated with defect given relatively recent PCI. Therefore angiography is preferred to r/o progression/restenosis and insure that our management is appropriate. Hopefully cath today and home later if possible.  After checking schedule, cath today not possible so will plan for AM. 

## 2014-04-09 ENCOUNTER — Encounter (HOSPITAL_COMMUNITY)
Admission: EM | Disposition: A | Discharge status: Home / Self Care | Payer: Self-pay | Source: Other Acute Inpatient Hospital | Attending: Internal Medicine

## 2014-04-09 ENCOUNTER — Encounter (HOSPITAL_COMMUNITY): Payer: Self-pay | Admitting: Physician Assistant

## 2014-04-09 DIAGNOSIS — I2510 Atherosclerotic heart disease of native coronary artery without angina pectoris: Secondary | ICD-10-CM | POA: Insufficient documentation

## 2014-04-09 DIAGNOSIS — I209 Angina pectoris, unspecified: Secondary | ICD-10-CM | POA: Insufficient documentation

## 2014-04-09 HISTORY — PX: LEFT HEART CATHETERIZATION WITH CORONARY ANGIOGRAM: SHX5451

## 2014-04-09 LAB — BASIC METABOLIC PANEL
BUN: 19 mg/dL (ref 6–23)
CO2: 23 mEq/L (ref 19–32)
Calcium: 9.2 mg/dL (ref 8.4–10.5)
Chloride: 98 mEq/L (ref 96–112)
Creatinine, Ser: 1.35 mg/dL — ABNORMAL HIGH (ref 0.50–1.10)
GFR, EST AFRICAN AMERICAN: 44 mL/min — AB (ref >90)
GFR, EST NON AFRICAN AMERICAN: 38 mL/min — AB (ref >90)
Glucose, Bld: 189 mg/dL — ABNORMAL HIGH (ref 70–99)
Potassium: 3.5 mEq/L — ABNORMAL LOW (ref 3.7–5.3)
SODIUM: 139 meq/L (ref 137–147)

## 2014-04-09 LAB — CBC
HEMATOCRIT: 34.5 % — AB (ref 36.0–46.0)
Hemoglobin: 11.1 g/dL — ABNORMAL LOW (ref 12.0–15.0)
MCH: 26.5 pg (ref 26.0–34.0)
MCHC: 32.2 g/dL (ref 30.0–36.0)
MCV: 82.3 fL (ref 78.0–100.0)
PLATELETS: 218 10*3/uL (ref 150–400)
RBC: 4.19 MIL/uL (ref 3.87–5.11)
RDW: 14.3 % (ref 11.5–15.5)
WBC: 10.7 10*3/uL — AB (ref 4.0–10.5)

## 2014-04-09 LAB — GLUCOSE, CAPILLARY
Glucose-Capillary: 128 mg/dL — ABNORMAL HIGH (ref 70–99)
Glucose-Capillary: 159 mg/dL — ABNORMAL HIGH (ref 70–99)

## 2014-04-09 LAB — PROTIME-INR
INR: 0.97 (ref 0.00–1.49)
PROTHROMBIN TIME: 12.7 s (ref 11.6–15.2)

## 2014-04-09 SURGERY — LEFT HEART CATHETERIZATION WITH CORONARY ANGIOGRAM
Anesthesia: LOCAL

## 2014-04-09 MED ORDER — FENTANYL CITRATE 0.05 MG/ML IJ SOLN
INTRAMUSCULAR | Status: AC
  Filled 2014-04-09: qty 2

## 2014-04-09 MED ORDER — ISOSORBIDE MONONITRATE ER 30 MG PO TB24: 30.0000 mg | ORAL_TABLET | Freq: Every day | ORAL | Status: DC

## 2014-04-09 MED ORDER — SODIUM CHLORIDE 0.9 % IJ SOLN: 3.0000 mL | INTRAMUSCULAR | Status: DC | PRN

## 2014-04-09 MED ORDER — SODIUM CHLORIDE 0.9 % IJ SOLN: 3.0000 mL | Freq: Two times a day (BID) | INTRAMUSCULAR | Status: DC

## 2014-04-09 MED ORDER — SODIUM CHLORIDE 0.9 % IV SOLN
250.0000 mL | INTRAVENOUS | Status: DC | PRN
Start: 2014-04-09 — End: 2014-04-09

## 2014-04-09 MED ORDER — SODIUM CHLORIDE 0.9 % IV SOLN: INTRAVENOUS | Status: DC

## 2014-04-09 MED ORDER — SODIUM CHLORIDE 0.9 % IV SOLN: 250.0000 mL | INTRAVENOUS | Status: DC | PRN

## 2014-04-09 MED ORDER — SODIUM CHLORIDE 0.9 % IV SOLN
1.0000 mL/kg/h | INTRAVENOUS | Status: AC
  Administered 2014-04-09: 1 mL/kg/h via INTRAVENOUS

## 2014-04-09 MED ORDER — POTASSIUM CHLORIDE ER 10 MEQ PO TBCR: 10.0000 meq | EXTENDED_RELEASE_TABLET | Freq: Every day | ORAL | Status: DC

## 2014-04-09 MED ORDER — ASPIRIN 81 MG PO CHEW
81.0000 mg | CHEWABLE_TABLET | ORAL | Status: AC
  Administered 2014-04-09: 81 mg via ORAL
  Filled 2014-04-09: qty 1

## 2014-04-09 MED ORDER — POTASSIUM CHLORIDE CRYS ER 20 MEQ PO TBCR
40.0000 meq | EXTENDED_RELEASE_TABLET | Freq: Once | ORAL | Status: AC
  Administered 2014-04-09: 40 meq via ORAL
  Filled 2014-04-09: qty 2

## 2014-04-09 MED ORDER — VERAPAMIL HCL 2.5 MG/ML IV SOLN
INTRAVENOUS | Status: AC
  Filled 2014-04-09: qty 2

## 2014-04-09 MED ORDER — SODIUM CHLORIDE 0.9 % IJ SOLN
3.0000 mL | Freq: Two times a day (BID) | INTRAMUSCULAR | Status: DC
Start: 2014-04-09 — End: 2014-04-09

## 2014-04-09 MED ORDER — HEPARIN SODIUM (PORCINE) 1000 UNIT/ML IJ SOLN
INTRAMUSCULAR | Status: AC
  Filled 2014-04-09: qty 1

## 2014-04-09 MED ORDER — NITROGLYCERIN 0.2 MG/ML ON CALL CATH LAB
INTRAVENOUS | Status: AC
  Filled 2014-04-09: qty 1

## 2014-04-09 MED ORDER — TRAMADOL HCL 50 MG PO TABS
50.0000 mg | ORAL_TABLET | Freq: Four times a day (QID) | ORAL | Status: DC | PRN
  Administered 2014-04-09: 50 mg via ORAL
  Filled 2014-04-09: qty 1

## 2014-04-09 MED ORDER — HEPARIN (PORCINE) IN NACL 2-0.9 UNIT/ML-% IJ SOLN
INTRAMUSCULAR | Status: AC
  Filled 2014-04-09: qty 1000

## 2014-04-09 MED ORDER — LIDOCAINE HCL (PF) 1 % IJ SOLN
INTRAMUSCULAR | Status: AC
  Filled 2014-04-09: qty 30

## 2014-04-09 MED ORDER — ISOSORBIDE MONONITRATE ER 30 MG PO TB24
30.0000 mg | ORAL_TABLET | Freq: Every day | ORAL | Status: DC
  Administered 2014-04-09: 30 mg via ORAL
  Filled 2014-04-09: qty 1

## 2014-04-09 MED ORDER — MIDAZOLAM HCL 2 MG/2ML IJ SOLN
INTRAMUSCULAR | Status: AC
  Filled 2014-04-09: qty 2

## 2014-04-09 NOTE — CV Procedure (Signed)
CARDIAC CATHETERIZATION REPORT  NAME:  CORVETTE ORSER   MRN: [id number] DOB:  06/20/1941   ADMIT DATE: 04/07/2014 Procedure Date: 04/09/2014  INTERVENTIONAL CARDIOLOGIST: Leonie Man, M.D., MS PRIMARY CARE PROVIDER: Lavonne Chick, MD PRIMARY CARDIOLOGIST: Troy Sine., MD  PATIENT:  [name] Lane is a 73 y.o. female with a distant history of CAD dating back 2003 when she had PCI to the LAD and RCA. She was then admitted in March of 2015 with a mild non-STEMI. She underwent staged PCI to the D1 branch of the LAD with a Promus DES 2.25 mm 12 mm (2.4 mm) stent and then staged PCI of the proximal-mid RCA with a Xience upright DES 3.5 mm x 15 mm (3.6 mm). He initially did very well without any further symptoms until about a week ago when she had a fall. Following that she started noticing both right-sided chest wall pain as well as left-sided pain that was exertional in nature. This is considered to be possibly class III angina based on amount of exertion. With a recent history of PCI she is referred for diagnostic catheterization possible PCI.  PRE-OPERATIVE DIAGNOSIS:    Class III Angina - Unstable  Known CAD - recent PCI  PROCEDURES PERFORMED:    Left Heart Catheterization with Native Coronary Angiography  via Right Radial Artery   Left Ventriculography  PROCEDURE: The patient was brought to the [facility] in the fasting state and prepped and draped in the usual sterile fashion for Right Radial artery access. A modified Allen's test was performed on the right wrist demonstrating excellent collateral flow for radial access.   Sterile technique was used including antiseptics, cap, gloves, gown, hand hygiene, mask and sheet. Skin prep: Chlorhexidine.   Consent: Risks of procedure as well as the alternatives and risks of each were explained to the (patient/caregiver). Consent for procedure obtained.   Time Out: Verified patient identification,  verified procedure, site/side was marked, verified correct patient position, special equipment/implants available, medications/allergies/relevent history reviewed, required imaging and test results available. Performed.  Access:   Right Radial Artery: 6 Fr Sheath -  Seldinger Technique (Angiocath Micropuncture Kit)  Radial Cocktail - 10 mL; IV Heprin 4500 Units   Left Heart Catheterization: 5Fr Catheters advanced or exchanged over a J-wire; JR 4 catheter advanced first.  Right Coronary Artery Cineangiography: JR 4 Catheter   Left Coronary Artery Cineangiography:: JL 3.5 Catheter   LV Hemodynamics (LV Gram): Angled pigtail  Sheath removed in the CATH lab. TR Band: 0930  Hours; 14 mL air  FINDINGS:  Hemodynamics:   Central Aortic Pressure / Mean: 99/41/64 mmHg  Left Ventricular Pressure / LVEDP: 116/3/6 mmHg  Aortic Valve Gradient: Peak-Peak 24 mmHg; Mean 19.4 mmHg  Left Ventriculography:  EF: 65 %  Wall Motion: normal  Coronary Anatomy:  Left Main: Normal caliber vessel that is relatively long period bifurcates into the LAD and Circumflex. Mild calcification but otherwise angiographically normal. LAD: Normal/large caliber vessel with a patent stent just prior to SP1. It then bifurcates into the equal size D1 in the LAD. There is a stable roughly 20% stenosis just beyond the bifurcation as it gives off SP1. The remainder the LAD is a moderate caliber and extremely tortuous vessel that just barely reaches the apex.  D1: Moderate caliber vessel ischemic tortuous but patent proximal stent. Several branches.  Left Circumflex: Large-caliber vessel that bifurcates early into a lateral OM 1 which is at least moderate to  large in caliber and ACE moderate caliber AV circumflex is terminates as 2 small posterolateral branches. Minimal luminal irregularities in the circumflex system    RCA: Large caliber, dominant vessel with a widely patent stent in the proximal to mid segment. The  vessel but it is somewhat tortuous course at the crux.  The mid RCA stent is also patent. The vessel bifurcates distally into the Right Posterior Descending Artery (RPDA) and the Right Posterior AV Groove Branch (RPAV).  The RCA itself but no significant disease with patent stents.  RPDA: Continues as a large caliber somewhat tortuous vessel that reaches all the way to the apex. Minimal luminal irregularities noted distally.  RPL Sysytem:The RPAV large-caliber vessel that essentially terminates as an extensive large-caliber RPL to. There is a small RPL 1 as well as a small-caliber RPL 3. RPL 3 has an ostial 95% stenosis as it comes off of what is essentially the RPL 2.  After reviewing the initial angiography, the culprit lesion was thought to be the Ostial RPL3 branch lesion that is best treated with medical therapy.    MEDICATIONS:  Anesthesia:  Local Lidocaine 2 ml  Sedation:  1 mg IV Versed, 25 mcg IV fentanyl   Omnipaque Contrast: 70 ml  Anticoagulation:  IV Heparin 4500 Units  Radial Cocktail: 5 mg Verapamil, 400 mcg NTG, 2 ml 2% Lidocaine in 10 ml NS   PATIENT DISPOSITION:    The patient was transferred to the PACU holding area in a hemodynamicaly stable, chest pain free condition.  The patient tolerated the procedure well, and there were no complications.  EBL:   < 5 ml  The patient was stable before, during, and after the procedure.  POST-OPERATIVE DIAGNOSIS:    Widely patent stents in the LAD, D1 as well as RCA. Potential culprit lesion for angina is the ostial RPL 3 lesion is probably best suited for medical therapy.   The lesion would not be able to be treated with a stent. Would only be treated with a small Cutting Balloon that could jeopardize the very large RPL 2.  Well-preserved LV EF with normal LVEDP.  Mild Aortic Stenosis with peak gradient of 24 and mean of roughly 19 mmHg  PLAN OF CARE:  The patient will return to the nursing unit for post radial cath  care.  Was decided she should be on be discharged later on today with increased antianginal regimen - likely consider Imdur plus or minus Ranexa.  She should followup with Dr. Claiborne Billings, and if her symptoms aren't controllable with medications, we could consider Cutting Balloon angioplasty of the ostial RPL 3   [name] Lane, M.D., M.S. [facility] [address] [place], [place]  [address]  [phone]  04/09/2014 8:30 AM

## 2014-04-09 NOTE — Discharge Summary (Signed)
The patient underwent cardiac catheterization via right radial approach today revealing patent stents but significant lesion in the distal RCA system with a small RPL3 ostial 95% stenosis. This is best treated with medical therapy.  Plan will be discharged today titrating medications. It was my understanding from the time of her initial catheterization PCI in March, that she had intended to followup with Dr. Tresa Endo after the retirement of Dr. Riley Kill.  This followup will be arranged. I would be happy to have her followup with me if an appointment with Dr. Tresa Endo cannot be scheduled.  She should be ready for discharge after bedrest. We will add Imdur.  [name] Lane, M.D., M.S. Interventional Cardiologist   Pager # [phone] 04/09/2014

## 2014-04-09 NOTE — Interval H&P Note (Signed)
History and Physical Interval Note:  04/09/2014 7:46 AM  [name] Lane  has presented today for surgery, with the diagnosis of CP (moderate risk for cardiac etiology) concerning for Unstable Angina - Class III.    Per Dr. Katrinka Blazing: This is a difficult clinical situation. Hes two types of discomfort. Right lower chest started after a fall occurring recently. The other is a left chest tightness that is associated with dyspnea. This discomfort went away completely after PCI in March. Over the past 4-6 weeks she has recurrent discomfort and limitation in exertional tolerance. Had Mid RCA DES in 3/15. Nuclear may be associated with defect given relatively recent PCI. Therefore angiography is preferred to r/o progression/restenosis and insure that our management is appropriate.  Hopefully cath today and home later if possible.   The various methods of treatment have been discussed with the patient and family. After consideration of risks, benefits and other options for treatment, the patient has consented to  Procedure(s): LEFT HEART CATHETERIZATION WITH CORONARY ANGIOGRAM (N/A) +/- PCI  as a surgical intervention .   The patient's history has been reviewed, patient examined, no change in status, stable for surgery.  I have reviewed the patient's chart and labs.  Questions were answered to the patient's satisfaction.     HARDING,DAVID W Cath Lab Visit (complete for each Cath Lab visit)  Clinical Evaluation Leading to the Procedure:   ACS: ~Yes  Non-ACS:    Anginal Classification: CCS III  Anti-ischemic medical therapy: Maximal Therapy (2 or more classes of medications)  Non-Invasive Test Results: No non-invasive testing performed  Prior CABG: No previous CABG

## 2014-04-09 NOTE — H&P (View-Only) (Signed)
This is a difficult clinical situation. Hes two types of discomfort. Right lower chest started after a fall occurring recently. The other is a left chest tightness that is associated with dyspnea. This discomfort went away completely after PCI in March. Over the past 4-6 weeks she has recurrent discomfort and limitation in exertional tolerance. Had Mid RCA DEC in 3/15. Nuclear may be associated with defect given relatively recent PCI. Therefore angiography is preferred to r/o progression/restenosis and insure that our management is appropriate. Hopefully cath today and home later if possible.  After checking schedule, cath today not possible so will plan for AM.

## 2014-04-09 NOTE — H&P (View-Only) (Signed)
Patient Profile: [name] Lane is a 73 y.o. female with a history of HTN, HLD, CAD and renal insufficiency, last admitted in March of this year for chest pain. LHC revealed severe 2 vessel CAD with a 95% diagonal lesion as well as 80% RCA lesion. Due to renal insufficiency, she underwent staged intervention: s/p DES to diagonal and DES to proximal-mid RCA. On DAPT w/ ASA + Brilinta. She apparently fell last week by her bed and initially endorsed right-sided chest wall pain. However, she developed left-sided chest pain and presented to [facility]. POC Troponin was mildly elevated there and she was transferred to [facility]   Cardiac Enzymes at [facility]: 0.072 Cardiac Enzymes at [facility]: Negative x 2 EKG: Nonischemic DG Ribs: Pending   Subjective: Currently CP free. She states her pain resolved after taking NTG.   Objective: Vital signs in last 24 hours: Temp:  [98.2 F (36.8 C)-98.8 F (37.1 C)] 98.8 F (37.1 C) (06/10 0500) Pulse Rate:  [83-91] 91 (06/10 0500) Resp:  [18-20] 20 (06/10 0500) BP: (138-152)/(61-98) 138/61 mmHg (06/10 0500) SpO2:  [96 %-98 %] 97 % (06/10 0500) Weight:  [189 lb 3.2 oz (85.821 kg)-190 lb 8 oz (86.41 kg)] 189 lb 3.2 oz (85.821 kg) (06/10 0540) Last BM Date: 04/07/14  Intake/Output from previous day: 06/09 0701 - 06/10 0700 In: -  Out: 950 [Urine:950] Intake/Output this shift:    Medications Current Facility-Administered Medications  Medication Dose Route Frequency Provider Last Rate Last Dose  . 0.9 %  sodium chloride infusion   Intravenous Continuous Cecilie Kicks, NP 10 mL/hr at 04/07/14 2030    . acetaminophen (TYLENOL) tablet 975 mg  975 mg Oral Q6H PRN Cecilie Kicks, NP      . albuterol (PROVENTIL) (2.5 MG/3ML) 0.083% nebulizer solution 2.5 mg  2.5 mg Inhalation Q6H PRN Cecilie Kicks, NP      . amLODipine (NORVASC) tablet 5 mg  5 mg Oral Daily Cecilie Kicks, NP      . aspirin chewable tablet 324 mg  324 mg Oral NOW Cecilie Kicks, NP       Or  .  aspirin suppository 300 mg  300 mg Rectal NOW Cecilie Kicks, NP      . aspirin EC tablet 81 mg  81 mg Oral Daily Cecilie Kicks, NP      . budesonide (PULMICORT) nebulizer solution 0.5 mg  0.5 mg Nebulization BID Cecilie Kicks, NP   0.5 mg at 04/07/14 2111  . carvedilol (COREG) tablet 6.25 mg  6.25 mg Oral BID WC Cecilie Kicks, NP   6.25 mg at 04/07/14 2045  . FLUoxetine (PROZAC) capsule 20 mg  20 mg Oral Daily Cecilie Kicks, NP      . furosemide (LASIX) tablet 20 mg  20 mg Oral Daily Cecilie Kicks, NP   20 mg at 04/07/14 2047  . heparin ADULT infusion 100 units/mL (25000 units/250 mL)  1,000 Units/hr Intravenous Continuous Crystal Avanell Shackleton, RPH 10 mL/hr at 04/07/14 2122 1,000 Units/hr at 04/07/14 2122  . insulin aspart (novoLOG) injection 0-9 Units  0-9 Units Subcutaneous TID WC Cecilie Kicks, NP      . insulin glargine (LANTUS) injection 20 Units  20 Units Subcutaneous QHS Cecilie Kicks, NP   20 Units at 04/07/14 2243  . levalbuterol (XOPENEX) nebulizer solution 0.63 mg  0.63 mg Nebulization Q6H PRN Pixie Casino, MD      . loratadine (CLARITIN) tablet 10 mg  10 mg Oral Daily Cecilie Kicks, NP      .  LORazepam (ATIVAN) tablet 1 mg  1 mg Oral QHS Cecilie Kicks, NP   1 mg at 04/07/14 2245  . methocarbamol (ROBAXIN) tablet 500 mg  500 mg Oral Q8H PRN Cecilie Kicks, NP      . morphine 2 MG/ML injection 2 mg  2 mg Intravenous Q2H PRN Cecilie Kicks, NP      . nitroGLYCERIN (NITROGLYN) 2 % ointment 1 inch  1 inch Topical 4 times per day Cecilie Kicks, NP   1 inch at 04/08/14 0531  . nitroGLYCERIN (NITROSTAT) SL tablet 0.4 mg  0.4 mg Sublingual Q5 min PRN Cecilie Kicks, NP      . simvastatin (ZOCOR) tablet 20 mg  20 mg Oral q1800 Cecilie Kicks, NP   20 mg at 04/07/14 2046  . ticagrelor (BRILINTA) tablet 90 mg  90 mg Oral BID Cecilie Kicks, NP   90 mg at 04/07/14 2049  . tiotropium (SPIRIVA) inhalation capsule 18 mcg  18 mcg Inhalation Daily Cecilie Kicks, NP        PE: General appearance: alert,  cooperative and no distress Lungs: clear to auscultation bilaterally Heart: regular rate and rhythm and 2/6 SM Extremities: Trace LEE Pulses: 2+ bilateral LEE Skin: warm and dry Neurologic: Grossly normal  Lab Results:   Recent Labs  04/08/14 0120  WBC 10.3  HGB 11.5*  HCT 35.6*  PLT 256   BMET No results found for this basename: NA, K, CL, CO2, GLUCOSE, BUN, CREATININE, CALCIUM,  in the last 72 hours PT/INR  Recent Labs  04/07/14 2019  LABPROT 13.2  INR 1.02   Cardiac Panel (last 3 results)  Recent Labs  04/07/14 2019 04/08/14 0120  CKTOTAL 66  --   CKMB 2.0  --   TROPONINI <0.30 <0.30  RELINDX RELATIVE INDEX IS INVALID  --      Assessment/Plan  Principal Problem:   Chest pain with moderate risk of acute coronary syndrome Active Problems:   HYPERCHOLESTEROLEMIA  IIA   Anxiety state, unspecified   HYPERTENSION   CAD - remote LAD and RCA stenting, last cath 12/11- 20% LAD, 40% RCA   C O P D   Dyspnea   S/P coronary artery stent placement 01/02/14 stent to diag.Promus Premier and staged DES to prox mid RCA   Chest pain, moderate coronary artery risk  1. Chest Pain: ? Cardiac vs Non cardiac. Troponin at outside hospital was slightly elevated at 0.072. Cardiac enzymes here at [facility] have been negative. Total CK and CK MB also normal. ESR however was slightly elevated at 23. EKG is non ischemic, however her left-sided chest pain was relieved with NTG. CXR to assess ribs pending to see if patient suffered a fracture from her recent fall. ? Ischemic eval with a NST. She has been NPO since midnight.  2. CAD: s/p PCI + DES to diagonal and RCA. Currently CP free on IV heparin + nitro patch. ?NST to assess for ischemia. Keep NPO until seen by MD. Continue DAPT w/ ASA + Brilinta, BB and statin. No ACE given her CKD.   3. CKD: ? SCr. No BMP has been ordered. Will obtain today.   4. HTN: BP controlled. Continue amlodipine and Coreg.   5: HLD: Continue statin.    LOS: 1  day    Jayleen Scaglione M. Ladoris Gene 04/08/2014 7:47 AM

## 2014-04-09 NOTE — Discharge Summary (Signed)
Discharge Summary   Patient ID: [name] Lane MRN: [id number], DOB/AGE: 1941/03/21 73 y.o. Admit date: 04/07/2014 D/C date:     04/09/2014  Primary Care Provider: Lavonne Chick, MD Primary Cardiologist: Per Dr. Ellyn Hack, this is a patient of Dr. Evette Georges   Primary Discharge Diagnoses:  1. 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 Mild AS by cath 04/09/14 3. CKD stage III 4. Hypokalemia  Secondary Discharge Diagnoses:  1. Hyperlipidemia 2. HTN 3. Diabetes mellitus 4. COPD 5. Asthma 6. OSA, noncompliant with CPAP 7. GE Reflux 8. H/o carotid bruit - dopplers pending 9. Anxiety 10. H/o RP bleed in 2001  Hospital Course: [name] Lane is a 73 y.o. female with a history of HTN, HLD, CAD (s/p BMSx2 to LAD & RCA in 2003, NSTEMI 12/2013 s/p staged DESx2 to D1 and mRCA), asthmatic COPD, OSA, DM, anxiety, and CKD. She has been doing well until last week when she fell by her bed. She did not pass out but did not know why she fell. Her family picked her up with their arms around her chest and subsequent to that she had right sided pain with movement. Over the 3 days prior to admission she also complained of left chest heaviness, nausea, and occasional chills. She thus presented to [facility] on 6/9 for evaluation. She was also reporting left neck and shoulder pain. CXR showed low lung volumes with bibasilar atx but no mention of rib fractures. She did not try NTG for her pain. She reported med compliance. Her Troponin at [facility] was elevated at 0.072. Pro bnp was 683. H/H 12.7/39; WBC 9.5, Na 140, K+ 3.7 Cl 101, CO2 22, BUN 13, Cr 1.6, glucose 295 - she rec'd albuterol treatment in [place] also. EKG showed NSR without ischemic changes. She was transferred to [facility] and admitted for further observation.  Troponins were negative x 3 here thus ruling her out for MI. Dedicated rib plain films were negative for fracture, acute infiltrate, pulmonary edema or pneumothorax. The right sided CP was felt MSK, but given concern for angina with left sided CP/dyspnea, she underwent cath. This demonstrated: Widely patent stents in the LAD, D1 as well as RCA. Potential culprit lesion for angina is the ostial RPL 3 lesion is probably best suited for medical therapy. The lesion would not be able to be treated with a stent. Would only be treated with a small Cutting Balloon that could jeopardize the very large RPL 2.  Well-preserved LV EF with normal LVEDP.  Mild Aortic Stenosis with peak gradient of 24 and mean of roughly 19 mmHg  Dr. Ellyn Hack recommended continued medical therapy and so she was started on Imdur. He stated that if symptoms aren't controllable with medications, we could consider Cutting Balloon angioplasty of the ostial RPL 3. She tolerated cath well. Dr. Ellyn Hack and Dr. Tamala Julian have seen and examined the patient today and feels she is stable for discharge. Given hypokalemia, we have also prescribed KCl 68mq daily (take 2 on days when she takes 2 tablets of Lasix), dosed conservatively given her CKD. Given CKD stage III, will recheck BMET on Monday post-cath to reassure stability. Per Dr. HEllyn Hack this is a patient of Dr. KEvette Georgesso will follow up with him.  Discharge Vitals: Blood pressure 143/61, pulse  77, temperature 98 F (36.7 C), temperature source Oral, resp. rate 20, height 5' 4" (1.626 m), weight 188 lb 4.8 oz (85.412 kg), SpO2 98.00%.  Labs: Lab Results  Component Value Date   WBC 10.7* 04/09/2014   HGB 11.1* 04/09/2014   HCT 34.5* 04/09/2014   MCV 82.3 04/09/2014   PLT 218 04/09/2014     Recent Labs Lab 04/09/14 0444  NA 139  K 3.5*  CL 98  CO2 23  BUN 19  CREATININE 1.35*  CALCIUM 9.2  GLUCOSE 189*    Recent Labs  04/07/14 2019 04/08/14 0120 04/08/14 0718  CKTOTAL 62  --    --   CKMB 2.0  --   --   TROPONINI <0.30 <0.30 <0.30    Diagnostic Studies/Procedures   Dg Ribs Unilateral W/chest Right  04/08/2014   CLINICAL DATA:  Fall, right rib pain  EXAM: RIGHT RIBS AND CHEST - 3+ VIEW  COMPARISON:  04/07/2014  FINDINGS: Three views right ribs submitted. No acute infiltrate or pulmonary edema. No rib fracture. No pneumothorax.  IMPRESSION: Negative.   Electronically Signed   By: Lahoma Crocker M.D.   On: 04/08/2014 09:13    Cardiac Cath 04/07/14 NAME: DEBORAHANN POTEAT MRN: [id number]  DOB: [birth year] ADMIT DATE: 04/07/2014  Procedure Date: 04/09/2014  INTERVENTIONAL CARDIOLOGIST: Leonie Man, M.D., MS  PRIMARY CARE PROVIDER: Lavonne Chick, MD  PRIMARY CARDIOLOGIST: Troy Sine., MD  PATIENT: [name] Lane is a 73 y.o. female with a distant history of CAD dating back 2003 when she had PCI to the LAD and RCA. She was then admitted in March of 2015 with a mild non-STEMI. She underwent staged PCI to the D1 branch of the LAD with a Promus DES 2.25 mm 12 mm (2.4 mm) stent and then staged PCI of the proximal-mid RCA with a Xience upright DES 3.5 mm x 15 mm (3.6 mm). He initially did very well without any further symptoms until about a week ago when she had a fall. Following that she started noticing both right-sided chest wall pain as well as left-sided pain that was exertional in nature. This is considered to be possibly class III angina based on amount of exertion. With a recent history of PCI she is referred for diagnostic catheterization possible PCI.  PRE-OPERATIVE DIAGNOSIS:  Class III Angina - Unstable  Known CAD - recent PCI PROCEDURES PERFORMED:  Left Heart Catheterization with Native Coronary Angiography via Right Radial Artery  Left Ventriculography PROCEDURE: The patient was brought to the 2nd [facility] in the fasting state and prepped and draped in the usual sterile fashion for Right Radial artery access. A modified Allen's  test was performed on the right wrist demonstrating excellent collateral flow for radial access. Sterile technique was used including antiseptics, cap, gloves, gown, hand hygiene, mask and sheet. Skin prep: Chlorhexidine.  Consent: Risks of procedure as well as the alternatives and risks of each were explained to the (patient/caregiver). Consent for procedure obtained.  Time Out: Verified patient identification, verified procedure, site/side was marked, verified correct patient position, special equipment/implants available, medications/allergies/relevent history reviewed, required imaging and test results available. Performed.  Access:  Right Radial Artery: 6 Fr Sheath - Seldinger Technique (Angiocath Micropuncture Kit)  Radial Cocktail - 10 mL; IV Heprin 4500 Units  Left Heart Catheterization: 5Fr Catheters advanced or exchanged over a J-wire; JR 4 catheter advanced first.  Right Coronary Artery Cineangiography: JR 4 Catheter  Left Coronary Artery Cineangiography::  JL 3.5 Catheter  LV Hemodynamics (LV Gram): Angled pigtail Sheath removed in the CATH lab.  TR Band: 0930 Hours; 14 mL air  FINDINGS:  Hemodynamics:  Central Aortic Pressure / Mean: 99/41/64 mmHg  Left Ventricular Pressure / LVEDP: 116/3/6 mmHg  Aortic Valve Gradient: Peak-Peak 24 mmHg; Mean 19.4 mmHg Left Ventriculography:  EF: 65 %  Wall Motion: normal Coronary Anatomy:  Left Main: Normal caliber vessel that is relatively long period bifurcates into the LAD and Circumflex. Mild calcification but otherwise angiographically normal. LAD: Normal/large caliber vessel with a patent stent just prior to SP1. It then bifurcates into the equal size D1 in the LAD. There is a stable roughly 20% stenosis just beyond the bifurcation as it gives off SP1. The remainder the LAD is a moderate caliber and extremely tortuous vessel that just barely reaches the apex.  D1: Moderate caliber vessel ischemic tortuous but patent proximal stent. Several  branches. Left Circumflex: Large-caliber vessel that bifurcates early into a lateral OM 1 which is at least moderate to large in caliber and ACE moderate caliber AV circumflex is terminates as 2 small posterolateral branches. Minimal luminal irregularities in the circumflex system  RCA: Large caliber, dominant vessel with a widely patent stent in the proximal to mid segment. The vessel but it is somewhat tortuous course at the crux. The mid RCA stent is also patent. The vessel bifurcates distally into the Right Posterior Descending Artery (RPDA) and the Right Posterior AV Groove Branch (RPAV). The RCA itself but no significant disease with patent stents.  RPDA: Continues as a large caliber somewhat tortuous vessel that reaches all the way to the apex. Minimal luminal irregularities noted distally.  RPL Sysytem:The RPAV large-caliber vessel that essentially terminates as an extensive large-caliber RPL to. There is a small RPL 1 as well as a small-caliber RPL 3. RPL 3 has an ostial 95% stenosis as it comes off of what is essentially the RPL 2. After reviewing the initial angiography, the culprit lesion was thought to be the Ostial RPL3 branch lesion that is best treated with medical therapy.  MEDICATIONS:  Anesthesia: Local Lidocaine 2 ml Sedation: 1 mg IV Versed, 25 mcg IV fentanyl  Omnipaque Contrast: 70 ml  Anticoagulation: IV Heparin 4500 Units  Radial Cocktail: 5 mg Verapamil, 400 mcg NTG, 2 ml 2% Lidocaine in 10 ml NS PATIENT DISPOSITION:  The patient was transferred to the PACU holding area in a hemodynamicaly stable, chest pain free condition.  The patient tolerated the procedure well, and there were no complications. EBL: < 5 ml  The patient was stable before, during, and after the procedure. POST-OPERATIVE DIAGNOSIS:  Widely patent stents in the LAD, D1 as well as RCA. Potential culprit lesion for angina is the ostial RPL 3 lesion is probably best suited for medical therapy. The lesion would  not be able to be treated with a stent. Would only be treated with a small Cutting Balloon that could jeopardize the very large RPL 2.  Well-preserved LV EF with normal LVEDP.  Mild Aortic Stenosis with peak gradient of 24 and mean of roughly 19 mmHg PLAN OF CARE:  The patient will return to the nursing unit for post radial cath care.  Was decided she should be on be discharged later on today with increased antianginal regimen - likely consider Imdur plus or minus Ranexa.  She should followup with Dr. Claiborne Billings, and if her symptoms aren't controllable with medications, we could consider Cutting Balloon angioplasty of the ostial RPL  Rico, M.D., M.S.  [facility]  [address] [place], [place] [address]  [phone]  04/09/2014  8:30 AM    CXR @ [place]: low volume chest with bibasilar atx. No consolidation or effusion  Discharge Medications   Current Discharge Medication List    START taking these medications   Details  isosorbide mononitrate (IMDUR) 30 MG 24 hr tablet Take 1 tablet (30 mg total) by mouth daily. Qty: 30 tablet, Refills: 6    potassium chloride (K-DUR) 10 MEQ tablet Take 1 tablet (10 mEq total) by mouth daily. Take 1 additional tablet on the days you take an extra Lasix. Qty: 30 tablet, Refills: 3      CONTINUE these medications which have NOT CHANGED   Details  acetaminophen (TYLENOL) 325 MG tablet Take 650 mg by mouth every 6 (six) hours as needed for moderate pain.     Albuterol Sulfate (PROAIR HFA IN) Inhale 2 puffs into the lungs every 4 (four) hours as needed (for shortness of breath).     amLODipine (NORVASC) 5 MG tablet Take 5 mg by mouth daily.    aspirin 81 MG tablet Take 81 mg by mouth daily.      budesonide (PULMICORT) 0.5 MG/2ML nebulizer solution Take 0.5 mg by nebulization 2 (two) times daily.    carvedilol (COREG) 6.25 MG tablet Take 1 tablet (6.25 mg total) by mouth 2 (two) times daily.       cetirizine (ZYRTEC) 10 MG tablet Take 10 mg by mouth daily.      FLUoxetine (PROZAC) 20 MG tablet Take 20 mg by mouth daily.     furosemide (LASIX) 20 MG tablet Take 20-40 mg by mouth daily.     insulin glargine (LANTUS) 100 UNIT/ML injection Inject 30 Units into the skin at bedtime.     insulin lispro (HUMALOG) 100 UNIT/ML injection Inject 0-12 Units into the skin 3 (three) times daily with meals. If BG less than 150, no insulin given 151-200, takes 3 units 201-250, takes 5 units  251-300, takes 8 units 301-350, takes 10 units 351-400, takes 12 units 400+, call MD    LORazepam (ATIVAN) 1 MG tablet Take 1 mg by mouth at bedtime.     methocarbamol (ROBAXIN) 500 MG tablet Take 500 mg by mouth every 8 (eight) hours as needed for muscle spasms.    nitroGLYCERIN (NITROSTAT) 0.4 MG SL tablet Place 1 tablet (0.4 mg total) under the tongue every 5 (five) minutes as needed for chest pain.    Associated Diagnoses: S/P angioplasty with stent    NON FORMULARY Inject 2 each into the muscle every 14 (fourteen) days. "allergy shots-[name]"= due on every other friday    pravastatin (PRAVACHOL) 40 MG tablet Take 1 tablet (40 mg total) by mouth daily.     predniSONE (DELTASONE) 5 MG tablet Take 5 mg by mouth daily with breakfast.    ranitidine (ZANTAC) 150 MG tablet Take 150 mg by mouth every morning.     ticagrelor (BRILINTA) 90 MG TABS tablet Take 90 mg by mouth 2 (two) times daily.    tiotropium (SPIRIVA) 18 MCG inhalation capsule Place 18 mcg into inhaler and inhale daily.    albuterol (PROVENTIL) (2.5 MG/3ML) 0.083% nebulizer solution Take 2.5 mg by nebulization every 6 (six) hours as needed for wheezing or shortness of breath.        Disposition   The patient will be discharged in stable condition to home. Discharge Instructions  Diet - low sodium heart healthy    Complete by:  As directed      Increase activity slowly    Complete by:  As directed   No driving for 3 days. No  lifting over 5 lbs for 1 week. No sexual activity for 1 week. Keep procedure site clean & dry. If you notice increased pain, swelling, bleeding or pus, call/return!  You may shower, but no soaking baths/hot tubs/pools for 1 week.          Follow-up Information   Follow up with Labwork. (Please take lab slip to the lab on Monday 04/13/14 to have bloodwork rechecked. There is a lab on the first floor of the building Dr. Evette Georges office is in. If there is a lab closer to home that you want to go to, you can do this also.)       Follow up with Troy Sine, MD. (04/20/14 at 2:15pm)    Specialty:  Cardiology   Contact information:   [address] [place] [address] [phone]         Duration of Discharge Encounter: Greater than 30 minutes including physician and PA time.  Signed, Dayna Dunn PA-C 04/09/2014, 1:26 PM

## 2014-04-09 NOTE — Discharge Instructions (Signed)

## 2014-04-09 NOTE — Progress Notes (Signed)
The cath results are noted. She has distal RCA disease beyond the takeoff of the PDA. This could be causing angina. The recommendation is to attempt medica therapy before attempting PCI for refractory angina. Start IMDUR 30 mg . Ambulate and home later today. Can optimize therapy as OP.

## 2014-04-15 ENCOUNTER — Encounter: Payer: Self-pay | Admitting: Cardiovascular Disease

## 2014-04-17 ENCOUNTER — Ambulatory Visit (INDEPENDENT_AMBULATORY_CARE_PROVIDER_SITE_OTHER): Payer: Medicare Other

## 2014-04-17 DIAGNOSIS — J45909 Unspecified asthma, uncomplicated: Secondary | ICD-10-CM

## 2014-04-17 MED ORDER — OMALIZUMAB 150 MG ~~LOC~~ SOLR
375.0000 mg | Freq: Once | SUBCUTANEOUS | Status: AC
  Administered 2014-04-17: 375 mg via SUBCUTANEOUS

## 2014-04-20 ENCOUNTER — Encounter: Payer: Self-pay | Admitting: Cardiovascular Disease

## 2014-04-20 ENCOUNTER — Ambulatory Visit (INDEPENDENT_AMBULATORY_CARE_PROVIDER_SITE_OTHER): Payer: Medicare Other | Admitting: Cardiovascular Disease

## 2014-04-20 VITALS — BP 138/86 | HR 85 | Ht 64.0 in | Wt 194.4 lb

## 2014-04-20 DIAGNOSIS — E7800 Pure hypercholesterolemia, unspecified: Secondary | ICD-10-CM

## 2014-04-20 DIAGNOSIS — R0609 Other forms of dyspnea: Secondary | ICD-10-CM

## 2014-04-20 DIAGNOSIS — I2510 Atherosclerotic heart disease of native coronary artery without angina pectoris: Secondary | ICD-10-CM

## 2014-04-20 DIAGNOSIS — R0600 Dyspnea, unspecified: Secondary | ICD-10-CM

## 2014-04-20 DIAGNOSIS — I25118 Atherosclerotic heart disease of native coronary artery with other forms of angina pectoris: Secondary | ICD-10-CM

## 2014-04-20 DIAGNOSIS — E119 Type 2 diabetes mellitus without complications: Secondary | ICD-10-CM

## 2014-04-20 DIAGNOSIS — I209 Angina pectoris, unspecified: Secondary | ICD-10-CM

## 2014-04-20 DIAGNOSIS — R0989 Other specified symptoms and signs involving the circulatory and respiratory systems: Secondary | ICD-10-CM

## 2014-04-20 MED ORDER — CARVEDILOL 12.5 MG PO TABS: 12.5000 mg | ORAL_TABLET | Freq: Two times a day (BID) | ORAL | Status: DC

## 2014-04-20 NOTE — Patient Instructions (Signed)
Your physician has recommended you make the following change in your medication: the carvedilol has been change to 12.5 mg twice daily. A new prescription has been sent to your pharmacy.  Your physician recommends that you schedule a follow-up appointment in: 2-3 months.

## 2014-04-22 ENCOUNTER — Ambulatory Visit: Payer: Medicare Other | Admitting: Adult Health

## 2014-04-27 ENCOUNTER — Encounter: Payer: Self-pay | Admitting: Cardiovascular Disease

## 2014-04-27 NOTE — Progress Notes (Signed)
Patient ID: [name] Lane, female   DOB: [birth year], 73 y.o.   MRN: [id number]     HPI: [name] Lane is a 73 y.o. female who presents to the office today for follow up cardiology evaluation following her recent cardiac catheterization and intervention by Dr. Herbie Baltimore.  [name] Lane has a history of hypertension, hyperlipidemia, prior CABG with bare-metal stenting to her right coronary artery and LAD in 2003, and PTCA for in-stent restenosis in 2003.  She was admitted to [facility] on 12/30/2013 with chest pain.  A nuclear study suggested distal anterolateral ischemia and she had mildly positive troponin.  Cardiac catheterization, Dr. Herbie Baltimore, which showed severe 2 vessel CAD with 95% proximal stenosis.  The first diagonal vessel, which was felt to be the culprit lesion.  She also at 80% RCA stenosis.  She underwent single vessel intervention to the diagonal vessel initially and due to renal insufficiency underwent staged intervention to her proximal RCA with DES stent.  She was seen by Dr. Herbie Baltimore on 02/12/2014 and has been on dual antiplatelet therapy.  She was rehospitalized on June 9 with chest pain and was transferred to from [facility].  She underwent repeat cardiac catheterization and her stents were widely patent and the LAD, diagonal, and right coronary artery.  It was felt that her right posterior lateral 3 lesion was the culprit for chest pain, but due to small size.  Medical therapy was initially recommended with plans for potential cutting balloon intervention if recurrent symptoms developed.  He was also noted to have mild aortic stenosis with a peak gradient of 24 and a mean gradient of 19.  She now presents on my schedule for followup of her recent hospitalizations.  Since her last hospitalization.  She denies recurrent anginal symptoms.  She does note occasional muscle spasms.  She does have a history of depression, for which he takes Prozac.  She denies PND, or orthopnea.  She  denies palpitations.  Past Medical History  Diagnosis Date  . CAD (coronary artery disease)     a. 2003 BMS to RCA & LAD; PTCA of ISR 2003 b.12/2013 - staged DES PCI of the D1 and mRCA. c. 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  Marland Kitchen HLD (hyperlipidemia)   . Hypertension   . Type 2 diabetes mellitus treated with insulin   . COPD (chronic obstructive pulmonary disease)   . Unspecified asthma(493.90)   . Obstructive sleep apnea   . Edema   . GE reflux   . Carotid bruit   . Anxiety   . CKD (chronic kidney disease), stage III     a. Stage III-IV.  . Mild aortic stenosis     a. By cath 04/09/2014.  Marland Kitchen Retroperitoneal bleed     a. Hx of this on lovenox in 2001 (had been admitted for NSTEMI).    Past Surgical History  Procedure Laterality Date  . Appendectomy    . Cardiac catheterization  11/18/1999    Small non-STEMI; moderate 50-60% RCA lesion treated medically secondary to percutaneous RP bleed  . Percutaneous coronary stent intervention (pci-s)  02/17/2002    Two-vessel PCI: 80% mid LAD -- 2.25 mm x 8 mm Express 2 BMS; in RCA RCA 80% 3.5 mm x 12 mm Express 2 BMS  . Coronary angioplasty   04/29/2002    Cutting Balloon PTCA of LAD stent for ISR with 2.5 mm balloon  . Cardiac catheterization  April 2004, October  2010, December 2011    Stable CAD with mild ISR in LAD and RCA; treated medically  . Cardiac catheterization  01/04/2012    4 small non-STEMI; Myoview with lateral ischemia-- 95% proximal D1, 80% mid RCA post stent -- staged PCI   . Percutaneous coronary stent intervention (pci-s)   03/07 & 06/2012    7th - Two-vessel PCI: 80% mid LAD -- 2.25 mm x 8 mm Express 2 BMS; in RCA RCA 80% 3.5 mm x 12 mm Express 2 BMS; 9th - mid RCA a Xience alpine DES 3.5 mm x 15 mm (postdilated 3.5 mm)   . Transthoracic echocardiogram   01/02/2014    (Most recent) moderate concentric LVH. Normal EF of 65-70%. Grade 1 diastolic dysfunction. Mild AI    . Nm myoview ltd  01/01/2014    EF 64%. The distal anterior/anterolateral ischemia -- referred for cath    Allergies  Allergen Reactions  . Atorvastatin Other (See Comments)    REACTION: muscle aches  . Benzonatate Other (See Comments)    REACTION: confusion  . Heparin     Pt's son wants us to be aware this has caused bad leg cramps during admission 03/2014    Current Outpatient Prescriptions  Medication Sig Dispense Refill  . acetaminophen (TYLENOL) 325 MG tablet Take 650 mg by mouth every 6 (six) hours as needed for moderate pain.       Marland Kitchen. albuterol (PROVENTIL) (2.5 MG/3ML) 0.083% nebulizer solution Take 2.5 mg by nebulization every 6 (six) hours as needed for wheezing or shortness of breath.      . Albuterol Sulfate (PROAIR HFA IN) Inhale 2 puffs into the lungs every 4 (four) hours as needed (for shortness of breath).       Marland Kitchen. amLODipine (NORVASC) 5 MG tablet Take 5 mg by mouth daily.      Marland Kitchen. aspirin 81 MG tablet Take 81 mg by mouth daily.        . budesonide (PULMICORT) 0.5 MG/2ML nebulizer solution Take 0.5 mg by nebulization 2 (two) times daily.      . cetirizine (ZYRTEC) 10 MG tablet Take 10 mg by mouth daily.        Marland Kitchen. FLUoxetine (PROZAC) 20 MG tablet Take 20 mg by mouth daily.       . furosemide (LASIX) 20 MG tablet Take 20-40 mg by mouth daily.       . insulin glargine (LANTUS) 100 UNIT/ML injection Inject 30 Units into the skin at bedtime.       . insulin lispro (HUMALOG) 100 UNIT/ML injection Inject 0-12 Units into the skin 3 (three) times daily with meals. If BG less than 150, no insulin given 151-200, takes 3 units 201-250, takes 5 units  251-300, takes 8 units 301-350, takes 10 units 351-400, takes 12 units 400+, call MD      . isosorbide mononitrate (IMDUR) 30 MG 24 hr tablet Take 1 tablet (30 mg total) by mouth daily.  30 tablet  6  . LORazepam (ATIVAN) 1 MG tablet Take 1 mg by mouth at bedtime.       . methocarbamol (ROBAXIN) 500 MG tablet Take 500 mg by mouth every  8 (eight) hours as needed for muscle spasms.      . nitroGLYCERIN (NITROSTAT) 0.4 MG SL tablet Place 1 tablet (0.4 mg total) under the tongue every 5 (five) minutes as needed for chest pain.  25 tablet  6  . NON FORMULARY Inject 2 each into the muscle every 14 (  fourteen) days. "allergy shots-[name]"= due on every other friday      . potassium chloride (K-DUR) 10 MEQ tablet Take 1 tablet (10 mEq total) by mouth daily. Take 1 additional tablet on the days you take an extra Lasix.  30 tablet  3  . pravastatin (PRAVACHOL) 40 MG tablet Take 1 tablet (40 mg total) by mouth daily.  30 tablet  1  . predniSONE (DELTASONE) 5 MG tablet Take 5 mg by mouth daily with breakfast.      . ranitidine (ZANTAC) 150 MG tablet Take 150 mg by mouth every morning.       . ticagrelor (BRILINTA) 90 MG TABS tablet Take 90 mg by mouth 2 (two) times daily.      Marland Kitchen tiotropium (SPIRIVA) 18 MCG inhalation capsule Place 18 mcg into inhaler and inhale daily.      . carvedilol (COREG) 12.5 MG tablet Take 1 tablet (12.5 mg total) by mouth 2 (two) times daily.  60 tablet  6   No current facility-administered medications for this visit.    History   Social History  . Marital Status: Married    Spouse Name: N/A    Number of Children: N/A  . Years of Education: N/A   Occupational History  . Retirede    Social History Main Topics  . Smoking status: Former Smoker    Quit date: 10/31/1999  . Smokeless tobacco: Never Used  . Alcohol Use: No  . Drug Use: No  . Sexual Activity: Not on file   Other Topics Concern  . Not on file   Social History Narrative   She is a single mother of 7 (no note of being added divorced or without) 10 grandchildren and 15 great-grandchildren. She lives with her son. Either one of 2 sons or the daughter come with her to appointments. They carry a notebook complete all her medical data. She does all her activities of daily living. She is a former smoker smoked 20 packs a day for 30 years and quit in  2001. She did not drink alcohol. He does not get routine exercise due to dyspnea.   She is a former Writer for [employer]    Family History  Problem Relation Age of Onset  . Breast cancer Mother   . Heart disease      ROS General: Negative; No fevers, chills, or night sweats HEENT: Negative; No changes in vision or hearing, sinus congestion, difficulty swallowing Pulmonary: Negative; No cough, wheezing, shortness of breath, hemoptysis Cardiovascular: See HPI: No chest pain, presyncope, syncope, palpitations Positive for hyperlipidemia GI: Negative; No nausea, vomiting, diarrhea, or abdominal pain GU: Negative; No dysuria, hematuria, or difficulty voiding Musculoskeletal: Negative; no myalgias, joint pain, or weakness Hematologic: Negative; no easy bruising, bleeding Endocrine: Negative; no heat/cold intolerance; no diabetes, Neuro: Negative; no changes in balance, headaches Skin: Negative; No rashes or skin lesions Psychiatric: Positive for depression No behavioral problems, Sleep: Negative; No snoring,  daytime sleepiness, hypersomnolence, bruxism, restless legs, hypnogognic hallucinations. Other comprehensive 14 point system review is negative   Physical Exam BP 138/86  Pulse 85  Ht 5\' 4"  (1.626 m)  Wt 194 lb 6.4 oz (88.179 kg)  BMI 33.35 kg/m2 General: Alert, oriented, no distress.  Skin: normal turgor, no rashes, warm and dry HEENT: Normocephalic, atraumatic. Pupils equal round and reactive to light; sclera anicteric; extraocular muscles intact, No lid lag; Nose without nasal septal hypertrophy; Mouth/Parynx benign; Mallinpatti scale 3 Neck: No JVD, no carotid bruits; normal carotid upstroke  Lungs: clear to ausculatation and percussion bilaterally; no wheezing or rales, normal inspiratory and expiratory effort Chest wall: without tenderness to palpitation Heart: PMI not displaced, RRR, s1 s2 normal, 1/6 systolic murmur, No diastolic murmur, no rubs, gallops, thrills, or  heaves Abdomen: soft, nontender; no hepatosplenomehaly, BS+; abdominal aorta nontender and not dilated by palpation. Back: no CVA tenderness Pulses: 2+  Musculoskeletal: full range of motion, normal strength, no joint deformities Extremities: Pulses 2+, no clubbing cyanosis or edema, Homan's sign negative  Neurologic: grossly nonfocal; Cranial nerves grossly wnl Psychologic: Normal mood and affect   ECG (independently read by me): Normal sinus rhythm at 85 beats per minute.  Nonspecific ST changes.  LABS:  BMET    Component Value Date/Time   NA 139 04/09/2014 0444   K 3.5* 04/09/2014 0444   CL 98 04/09/2014 0444   CO2 23 04/09/2014 0444   GLUCOSE 189* 04/09/2014 0444   BUN 19 04/09/2014 0444   CREATININE 1.35* 04/09/2014 0444   CALCIUM 9.2 04/09/2014 0444   GFRNONAA 38* 04/09/2014 0444   GFRAA 44* 04/09/2014 0444     Hepatic Function Panel     Component Value Date/Time   PROT 7.0 12/31/2013 0520   ALBUMIN 3.4* 12/31/2013 0520   AST 16 12/31/2013 0520   ALT 13 12/31/2013 0520   ALKPHOS 56 12/31/2013 0520   BILITOT 0.3 12/31/2013 0520   BILIDIR 0.1 11/02/2010 1300     CBC    Component Value Date/Time   WBC 10.7* 04/09/2014 0444   RBC 4.19 04/09/2014 0444   HGB 11.1* 04/09/2014 0444   HCT 34.5* 04/09/2014 0444   PLT 218 04/09/2014 0444   MCV 82.3 04/09/2014 0444   MCH 26.5 04/09/2014 0444   MCHC 32.2 04/09/2014 0444   RDW 14.3 04/09/2014 0444   LYMPHSABS 0.5* 12/31/2013 0520   MONOABS 0.1 12/31/2013 0520   EOSABS 0.0 12/31/2013 0520   BASOSABS 0.0 12/31/2013 0520     BNP    Component Value Date/Time   PROBNP 320.8* 12/30/2013 1728    Lipid Panel     Component Value Date/Time   CHOL 215* 01/01/2014 0332   TRIG 256* 01/01/2014 0332   HDL 54 01/01/2014 0332   CHOLHDL 4.0 01/01/2014 0332   VLDL 51* 01/01/2014 0332   LDLCALC 110* 01/01/2014 0332     RADIOLOGY: Dg Ribs Unilateral W/chest Right  04/08/2014   CLINICAL DATA:  Fall, right rib pain  EXAM: RIGHT RIBS AND CHEST - 3+ VIEW  COMPARISON:   04/07/2014  FINDINGS: Three views right ribs submitted. No acute infiltrate or pulmonary edema. No rib fracture. No pneumothorax.  IMPRESSION: Negative.   Electronically Signed   By: Natasha Mead M.D.   On: 04/08/2014 09:13      ASSESSMENT AND PLAN: CarolUmland is a 73 year old female, who is 12 years status post prior intervention to her LAD and RCA with bare-metal stents.  She developed unstable angina symptoms in March 2015 and underwent staged.  DES PCI of the diagonal vessel, as well as the mid right coronary artery.  Due to recurrent chest pain.  repeat catheterization revealed previous stented segments to be patent.  However, she did have progressive disease and a small third right posterolateral branch for which initial medical therapy was recommended.  I did review her hospital records.  Currently, she has not had recurrent chest pain.  Her resting pulse is 85, and she is not well beta blocked on carvedilol, presently at 6.25 twice a day.  I will further titrate this to 12.5 mg twice a day.  She will continue amlodipine at  5 mg and isosorbide 30 mg.  If recurrent chest pain develops, isosorbide mononitrate will be further titrated.  If intervention is necessary do to the location of the stenosis cutting balloon rather than stenting may be necessary.  Her blood pressure today is controlled.  She will return for followup evaluation in approximately 2-3 months for further recommendations will be made at that time.     Lennette Biharihomas A. Kelly, MD, [facility] Inc[facility]ACC  04/27/2014 7:37 PM

## 2014-05-04 ENCOUNTER — Ambulatory Visit: Payer: Medicare Other

## 2014-05-06 ENCOUNTER — Encounter: Payer: Self-pay | Admitting: Adult Health

## 2014-05-06 ENCOUNTER — Ambulatory Visit (INDEPENDENT_AMBULATORY_CARE_PROVIDER_SITE_OTHER): Payer: Medicare Other

## 2014-05-06 ENCOUNTER — Ambulatory Visit (INDEPENDENT_AMBULATORY_CARE_PROVIDER_SITE_OTHER): Payer: Medicare Other | Admitting: Adult Health

## 2014-05-06 VITALS — BP 108/66 | HR 76 | Temp 98.1°F | Ht 64.0 in | Wt 191.0 lb

## 2014-05-06 DIAGNOSIS — J45901 Unspecified asthma with (acute) exacerbation: Secondary | ICD-10-CM

## 2014-05-06 DIAGNOSIS — J45909 Unspecified asthma, uncomplicated: Secondary | ICD-10-CM

## 2014-05-06 DIAGNOSIS — J449 Chronic obstructive pulmonary disease, unspecified: Secondary | ICD-10-CM

## 2014-05-06 DIAGNOSIS — G4730 Sleep apnea, unspecified: Secondary | ICD-10-CM

## 2014-05-06 DIAGNOSIS — I2510 Atherosclerotic heart disease of native coronary artery without angina pectoris: Secondary | ICD-10-CM

## 2014-05-06 NOTE — Progress Notes (Signed)
   Subjective:    Patient ID: [name] Lane, female    DOB: 04/09/1941, 73 y.o.   MRN: [id number]  HPI PCP: Reather LittlerLeslie Sharp, FNP  72/F, ex- smoker, quit 2001 for FU of COPD/asthma,  She has been steroid dependent since july'11 to 3/12, Again since 12/13, lowest 5 mg since 3/13 She does have upper airway pseudo wheeze which improves on calming down   Worked in a hosiery mill x 12y. Triggers being activity, perfumes, spring allergies (claritin helps) , cold weather etc. Spirometry >> FEv1 48 % c/w severe airway obstruction.   Son (EMT) reports severe anxiety, now on ativan  ON spiriva since june 2011with improved exercise tolerance  02/2011 >> started daliresp  Has a dog &  bird- cockatoo, clean cage.  RAST - IGE 636,  +house dust, mildly pos for cat & dog dander    PSG -Moderate OSA corrected by 12 cm Download 3/10-4/15/12 >> good control of events , leak ++ on full face mask, used son's nasal mask & liked it >>Changed to fixed pressure 12 cm   09/18/12 While being examined, she developed sudden onset increased upper airway wheezing with coughing  Seemed to improve somewhat with calming down  Son feels she has similar episodes at home - ? Anxiety induced    01/20/14  Adm 3/3-3/11/15 with nstemi, s/p PCI to diag & RCA Last xolair on 12/19/13 -has done remarkably well on xolair & 5mg  prednisone since jan 2014 - reduced hospitalisations No flares since 2013 dec Compliant with xolair & other meds, was on daily pred- inadvertently stopped in the hospital - she has self renewed Anxiety  05/06/2014 Follow up - COPD/asthma,  Returns for 3 month follow up for COPD and Asthma  Currently on pulmicort neb Twice daily , spiriva daily and [name]. Compliant with meds per pt.  Remains on daily prednisone 5mg  daily . Was unable to decrease dose as recommended from last ov (flare of dyspnea) CXR 04/08/14 showed NAD  No flare of cough, dyspnea or wheezing.  No increased SABA use. Rare use.  No hemoptysis  , orthopnea, increased leg swelling or fever.   Recent admission last month for chest pain. Underwent cath w/ patent stents . Felt potential angina could be d/t ostial lesion that was best suited for medical therapy along with Imdur.   Wears CPAP At bedtime  On avg 6 hr each night.  No issues with mask or machine.  Feels rested in am.  No increased daytime sleepiness.    Review of Systems  neg for any significant sore throat, dysphagia, itching, sneezing, nasal congestion or excess/ purulent secretions, fever, chills, sweats, unintended wt loss, pleuritic or exertional cp, hempoptysis, orthopnea pnd or change in chronic leg swelling. Also denies presyncope, palpitations, heartburn, abdominal pain, nausea, vomiting, diarrhea or change in bowel or urinary habits, dysuria,hematuria, rash, arthralgias, visual complaints, headache, numbness weakness or ataxia.     Objective:   Physical Exam  Gen. Pleasant, obese, in no distress, obese  ENT - no lesions, no post nasal drip Neck: No JVD, no thyromegaly, no carotid bruits Lungs: no use of accessory muscles, no dullness to percussion, decreased without rales or rhonchi  Cardiovascular: Rhythm regular, heart sounds  normal, no murmurs or gallops, no peripheral edema Musculoskeletal: No deformities, no cyanosis or clubbing , no tremors        Assessment & Plan:

## 2014-05-06 NOTE — Assessment & Plan Note (Signed)
No recent flare doing well on xolair  Cont on current regimen

## 2014-05-06 NOTE — Assessment & Plan Note (Signed)
  Continue on CPAP At bedtime  .  Follow up Dr. Vassie [name]  In 3 months and As needed

## 2014-05-06 NOTE — Assessment & Plan Note (Signed)
COPD/Asthma compensated on present regimen  Unable to taper steroids   Plan  Continue on Pulmicort Neb Twice daily   Continue on Spiriva daily  Continue on Prednisone 5mg  daily  Discuss with family doctor, we recommend Prevnar vaccine.  Follow up Dr. Vassie [name]  In 3 months and As needed

## 2014-05-06 NOTE — Patient Instructions (Signed)
Continue on Pulmicort Neb Twice daily   Continue on Spiriva daily  Continue on CPAP At bedtime  .  Continue on Prednisone 5mg  daily  Discuss with family doctor, we recommend Prevnar vaccine.  Follow up Dr. Vassie [name]  In 3 months and As needed

## 2014-05-07 MED ORDER — OMALIZUMAB 150 MG ~~LOC~~ SOLR
375.0000 mg | Freq: Once | SUBCUTANEOUS | Status: AC
  Administered 2014-05-07: 375 mg via SUBCUTANEOUS

## 2014-05-13 NOTE — Progress Notes (Signed)
Reviewed & agree with plan  

## 2014-05-20 ENCOUNTER — Ambulatory Visit (INDEPENDENT_AMBULATORY_CARE_PROVIDER_SITE_OTHER): Payer: Medicare Other

## 2014-05-20 DIAGNOSIS — J45909 Unspecified asthma, uncomplicated: Secondary | ICD-10-CM

## 2014-05-22 MED ORDER — OMALIZUMAB 150 MG ~~LOC~~ SOLR
375.0000 mg | Freq: Once | SUBCUTANEOUS | Status: AC
  Administered 2014-05-22: 375 mg via SUBCUTANEOUS

## 2014-06-05 ENCOUNTER — Ambulatory Visit: Payer: Medicare Other

## 2014-06-11 ENCOUNTER — Ambulatory Visit (INDEPENDENT_AMBULATORY_CARE_PROVIDER_SITE_OTHER): Payer: Medicare Other | Admitting: Pulmonary Disease

## 2014-06-11 ENCOUNTER — Encounter: Payer: Self-pay | Admitting: Pulmonary Disease

## 2014-06-11 ENCOUNTER — Ambulatory Visit: Payer: Medicare Other

## 2014-06-11 VITALS — BP 140/70 | HR 62 | Temp 97.1°F | Ht 64.0 in | Wt 194.8 lb

## 2014-06-11 DIAGNOSIS — I2510 Atherosclerotic heart disease of native coronary artery without angina pectoris: Secondary | ICD-10-CM

## 2014-06-11 DIAGNOSIS — J449 Chronic obstructive pulmonary disease, unspecified: Secondary | ICD-10-CM

## 2014-06-11 DIAGNOSIS — J4489 Other specified chronic obstructive pulmonary disease: Secondary | ICD-10-CM

## 2014-06-11 DIAGNOSIS — J45909 Unspecified asthma, uncomplicated: Secondary | ICD-10-CM

## 2014-06-11 DIAGNOSIS — J45901 Unspecified asthma with (acute) exacerbation: Secondary | ICD-10-CM

## 2014-06-11 NOTE — Patient Instructions (Signed)
OK to get xolair today - this has really helped you If you do really well, we can decrease to q 4 weeks in 2016 Start symbicort 160 2 puffs twice daily- sample & Rx- rinse mouth after If this works, can stop pulmicort nebuliser

## 2014-06-11 NOTE — Assessment & Plan Note (Signed)
Ct spiriva  

## 2014-06-11 NOTE — Assessment & Plan Note (Signed)
OK to get xolair today - this has really helped you If you do really well, we can decrease to q 4 weeks in 2016 Start symbicort 160 2 puffs twice daily- sample & Rx- rinse mouth after If this works, can stop pulmicort nebuliser  Will try to taper prednisone if stable x 3 months

## 2014-06-11 NOTE — Progress Notes (Signed)
   Subjective:    Patient ID: [name] Lane, female    DOB: 08/21/1941, 73 y.o.   MRN: [id number]  HPI PCP: Reather LittlerLeslie Sharp, FNP   72/F, ex- smoker, quit 2001 for FU of COPD/asthma,  She has been steroid dependent since july'11 to 3/12, Again since 12/13, lowest 5 mg since 3/13  She does have upper airway pseudo wheeze which improves on calming down  Worked in a hosiery mill x 12y. Triggers being activity, perfumes, spring allergies (claritin helps) , cold weather etc.  Son (EMT) reports severe anxiety, now on ativan  ON spiriva since june 2011with improved exercise tolerance  She has done remarkably well on xolair & 5mg  prednisone since jan 2014 - reduced hospitalisations   Has a dog & bird- cockatoo, clean cage.      Significant tests/ events Spirometry >> FEv1 48 % c/w severe airway obstruction.  RAST - IGE 636, +house dust, mildly pos for cat & dog dander  PSG -Moderate OSA - on CPAP 12 cm  02/2011 >> started daliresp   12/2013  nstemi, s/p PCI to diag & RCA  03/2014  cath w/ patent stents . Felt potential angina could be d/t ostial lesion that was best suited for medical therapy along with Imdur.    06/11/2014  Chief Complaint  Patient presents with  . Follow-up    Pt reports 2-3 days after getting xolair. It makes her ill, lazy, chest cong, prod cough-white phlem. at 1 time she thinks it caused her to wheeze.    Currently on pulmicort neb Twice daily , spiriva daily and [name]. Compliant with meds per pt.  Remains on daily prednisone 5mg  daily .  She wonders if her symptoms above could be related to xolair  No flare of cough, dyspnea or wheezing.  No increased SABA use. Rare use.  No hemoptysis , orthopnea, increased leg swelling or fever.    Wears CPAP At bedtime  On avg 6 hr each night.  No issues with mask or machine.  Feels rested in am.  No increased daytime sleepiness.     Review of Systems neg for any significant sore throat, dysphagia, itching, sneezing,  nasal congestion or excess/ purulent secretions, fever, chills, sweats, unintended wt loss, pleuritic or exertional cp, hempoptysis, orthopnea pnd or change in chronic leg swelling. Also denies presyncope, palpitations, heartburn, abdominal pain, nausea, vomiting, diarrhea or change in bowel or urinary habits, dysuria,hematuria, rash, arthralgias, visual complaints, headache, numbness weakness or ataxia.     Objective:   Physical Exam  Gen. Pleasant, well-nourished, in no distress ENT - no lesions, no post nasal drip Neck: No JVD, no thyromegaly, no carotid bruits Lungs: no use of accessory muscles, no dullness to percussion, decreased without rales or rhonchi  Cardiovascular: Rhythm regular, heart sounds  normal, no murmurs or gallops, 1+ peripheral edema Musculoskeletal: No deformities, no cyanosis or clubbing        Assessment & Plan:

## 2014-06-12 MED ORDER — OMALIZUMAB 150 MG ~~LOC~~ SOLR
375.0000 mg | Freq: Once | SUBCUTANEOUS | Status: AC
  Administered 2014-06-12: 375 mg via SUBCUTANEOUS

## 2014-06-12 NOTE — Addendum Note (Signed)
Addended by: Ronny BaconWELCHEL, Kiasia Chou C on: 06/12/2014 05:21 PM   Modules accepted: Orders

## 2014-06-26 ENCOUNTER — Ambulatory Visit (INDEPENDENT_AMBULATORY_CARE_PROVIDER_SITE_OTHER): Payer: Medicare Other

## 2014-06-26 DIAGNOSIS — J45909 Unspecified asthma, uncomplicated: Secondary | ICD-10-CM

## 2014-06-29 MED ORDER — OMALIZUMAB 150 MG ~~LOC~~ SOLR
375.0000 mg | Freq: Once | SUBCUTANEOUS | Status: AC
  Administered 2014-06-29: 375 mg via SUBCUTANEOUS

## 2014-07-10 ENCOUNTER — Ambulatory Visit (INDEPENDENT_AMBULATORY_CARE_PROVIDER_SITE_OTHER): Payer: Medicare Other

## 2014-07-10 DIAGNOSIS — J45909 Unspecified asthma, uncomplicated: Secondary | ICD-10-CM

## 2014-07-16 MED ORDER — OMALIZUMAB 150 MG ~~LOC~~ SOLR
375.0000 mg | Freq: Once | SUBCUTANEOUS | Status: AC
  Administered 2014-07-16: 375 mg via SUBCUTANEOUS

## 2014-07-23 ENCOUNTER — Ambulatory Visit (INDEPENDENT_AMBULATORY_CARE_PROVIDER_SITE_OTHER): Payer: Medicare Other | Admitting: Cardiovascular Disease

## 2014-07-23 ENCOUNTER — Other Ambulatory Visit: Payer: Self-pay

## 2014-07-23 ENCOUNTER — Encounter: Payer: Self-pay | Admitting: Cardiovascular Disease

## 2014-07-23 VITALS — BP 126/68 | HR 71 | Ht 64.0 in | Wt 191.0 lb

## 2014-07-23 DIAGNOSIS — I25118 Atherosclerotic heart disease of native coronary artery with other forms of angina pectoris: Secondary | ICD-10-CM

## 2014-07-23 DIAGNOSIS — I2510 Atherosclerotic heart disease of native coronary artery without angina pectoris: Secondary | ICD-10-CM

## 2014-07-23 DIAGNOSIS — I214 Non-ST elevation (NSTEMI) myocardial infarction: Secondary | ICD-10-CM

## 2014-07-23 DIAGNOSIS — I10 Essential (primary) hypertension: Secondary | ICD-10-CM

## 2014-07-23 DIAGNOSIS — E119 Type 2 diabetes mellitus without complications: Secondary | ICD-10-CM

## 2014-07-23 DIAGNOSIS — I209 Angina pectoris, unspecified: Secondary | ICD-10-CM

## 2014-07-23 DIAGNOSIS — E785 Hyperlipidemia, unspecified: Secondary | ICD-10-CM

## 2014-07-23 MED ORDER — EZETIMIBE 10 MG PO TABS: 10.0000 mg | ORAL_TABLET | Freq: Every day | ORAL | Status: DC

## 2014-07-23 MED ORDER — EZETIMIBE 10 MG PO TABS
10.0000 mg | ORAL_TABLET | Freq: Every day | ORAL | Status: DC
Start: 2014-07-23 — End: 2014-07-23

## 2014-07-23 MED ORDER — ISOSORBIDE MONONITRATE ER 60 MG PO TB24: 60.0000 mg | ORAL_TABLET | Freq: Every day | ORAL | Status: DC

## 2014-07-23 NOTE — Addendum Note (Signed)
Addended by: Meda Klinefelter D on: 07/23/2014 10:28 AM   Modules accepted: Orders, Medications

## 2014-07-23 NOTE — Progress Notes (Signed)
Patient ID: [name] Lane, female   DOB: 1941/01/07, 73 y.o.   MRN: [id number]     HPI: [name] Lane is a 73 y.o. female who presents to the office today for a 3 month follow up cardiology evaluation.  Ms. Wellnitz has a history of hypertension, hyperlipidemia, prior CABG with bare-metal stenting to her RCA and LAD in 2003, and PTCA for in-stent restenosis in 2003.  She was admitted to [facility] on 12/30/2013 with chest pain.  A nuclear study suggested distal anterolateral ischemia and she had mildly positive troponin.  Cardiac catheterization by Dr. Herbie Baltimore showed severe 2 vessel CAD with 95% proximal diagonal stenosis which was felt to be the culprit lesion.  She also at 80% RCA stenosis.  She underwent single vessel intervention to the diagonal vessel initially and due to renal insufficiency underwent staged intervention to her proximal RCA with DES stent.  She was seen by Dr. Herbie Baltimore on 02/12/2014 and had been on dual antiplatelet therapy.  She was rehospitalized on June 9 with chest pain and was transferred to from [facility].  She underwent repeat cardiac catheterization and her stents were widely patent and the LAD, diagonal, and right coronary artery.  It was felt that her right posterior lateral 3 lesion was the culprit for chest pain, but due to small size medical therapy was initially recommended with plans for potential cutting balloon intervention if recurrent symptoms developed.  She was also noted to have mild aortic stenosis with a peak gradient of 24 and a mean gradient of 19.   When I saw her in followup in June 2015, I recommended further titration of her carvedilol to 12.5 mg twice a day.  She does admit that her chest pain has improved.  She did have an episode of chest pain.  However, approximately 2-3 weeks ago during a period of increased stress.  She does note occasional muscle spasms.  She does have a history of depression, for which he takes Prozac.  She denies PND, or  orthopnea.  She denies palpitations.  There is some confusion about her medications.  Her medication list stated that she was both on Bystolic and carvedilol, but she believes she has not taking Bystolic.  There also some question of whether or not.  She has been taking amlodipine.  Past Medical History  Diagnosis Date  . CAD (coronary artery disease)     a. 2003 BMS to RCA & LAD; PTCA of ISR 2003 b.12/2013 - staged DES PCI of the D1 and mRCA. c. 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  Marland Kitchen HLD (hyperlipidemia)   . Hypertension   . Type 2 diabetes mellitus treated with insulin   . COPD (chronic obstructive pulmonary disease)   . Unspecified asthma(493.90)   . Obstructive sleep apnea   . Edema   . GE reflux   . Carotid bruit   . Anxiety   . CKD (chronic kidney disease), stage III     a. Stage III-IV.  . Mild aortic stenosis     a. By cath 04/09/2014.  Marland Kitchen Retroperitoneal bleed     a. Hx of this on lovenox in 2001 (had been admitted for NSTEMI).    Past Surgical History  Procedure Laterality Date  . Appendectomy    . Cardiac catheterization  11/18/1999    Small non-STEMI; moderate 50-60% RCA lesion treated medically secondary to percutaneous RP bleed  . Percutaneous coronary stent intervention (pci-s)  02/17/2002    Two-vessel PCI: 80% mid LAD -- 2.25 mm x 8 mm Express 2 BMS; in RCA RCA 80% 3.5 mm x 12 mm Express 2 BMS  . Coronary angioplasty   04/29/2002    Cutting Balloon PTCA of LAD stent for ISR with 2.5 mm balloon  . Cardiac catheterization  April 2004, October 2010, December 2011    Stable CAD with mild ISR in LAD and RCA; treated medically  . Cardiac catheterization  01/04/2012    4 small non-STEMI; Myoview with lateral ischemia-- 95% proximal D1, 80% mid RCA post stent -- staged PCI   . Percutaneous coronary stent intervention (pci-s)   03/07 & 06/2012    7th - Two-vessel PCI: 80% mid LAD -- 2.25 mm x 8 mm Express 2 BMS; in  RCA RCA 80% 3.5 mm x 12 mm Express 2 BMS; 9th - mid RCA a Xience alpine DES 3.5 mm x 15 mm (postdilated 3.5 mm)   . Transthoracic echocardiogram   01/02/2014    (Most recent) moderate concentric LVH. Normal EF of 65-70%. Grade 1 diastolic dysfunction. Mild AI  . Nm myoview ltd  01/01/2014    EF 64%. The distal anterior/anterolateral ischemia -- referred for cath    Allergies  Allergen Reactions  . Atorvastatin Other (See Comments)    REACTION: muscle aches  . Benzonatate Other (See Comments)    REACTION: confusion  . Heparin     Pt's son wants [place] to be aware this has caused bad leg cramps during admission 03/2014    Current Outpatient Prescriptions  Medication Sig Dispense Refill  . acetaminophen (TYLENOL) 325 MG tablet Take 650 mg by mouth every 6 (six) hours as needed for moderate pain.       Marland Kitchen albuterol (PROVENTIL) (2.5 MG/3ML) 0.083% nebulizer solution Take 2.5 mg by nebulization every 6 (six) hours as needed for wheezing or shortness of breath.      . Albuterol Sulfate (PROAIR HFA IN) Inhale 2 puffs into the lungs every 4 (four) hours as needed (for shortness of breath).       Marland Kitchen amLODipine (NORVASC) 5 MG tablet Take 5 mg by mouth daily.      Marland Kitchen aspirin 81 MG tablet Take 81 mg by mouth daily.        . budesonide (PULMICORT) 0.5 MG/2ML nebulizer solution Take 0.5 mg by nebulization 2 (two) times daily.      . carvedilol (COREG) 12.5 MG tablet Take 1 tablet (12.5 mg total) by mouth 2 (two) times daily.  60 tablet  6  . cetirizine (ZYRTEC) 10 MG tablet Take 10 mg by mouth daily.        Marland Kitchen FLUoxetine (PROZAC) 20 MG tablet Take 20 mg by mouth daily.       . furosemide (LASIX) 20 MG tablet Take 20-40 mg by mouth daily.       . insulin glargine (LANTUS) 100 UNIT/ML injection Inject 30 Units into the skin at bedtime.       . insulin lispro (HUMALOG) 100 UNIT/ML injection Inject 0-12 Units into the skin 3 (three) times daily with meals. If BG less than 150, no insulin given 151-200, takes 3  units 201-250, takes 5 units  251-300, takes 8 units 301-350, takes 10 units 351-400, takes 12 units 400+, call MD      . isosorbide mononitrate (IMDUR) 30 MG 24 hr tablet Take 1 tablet (30 mg total) by mouth daily.  30 tablet  6  . LORazepam (ATIVAN)  1 MG tablet Take 1 mg by mouth at bedtime.       . methocarbamol (ROBAXIN) 500 MG tablet Take 500 mg by mouth every 8 (eight) hours as needed for muscle spasms.      . nebivolol (BYSTOLIC) 5 MG tablet Take 5 mg by mouth daily.      . nitroGLYCERIN (NITROSTAT) 0.4 MG SL tablet Place 1 tablet (0.4 mg total) under the tongue every 5 (five) minutes as needed for chest pain.  25 tablet  6  . NON FORMULARY Inject 2 each into the muscle every 14 (fourteen) days. "allergy shots-[name]"= due on every other friday      . potassium chloride (K-DUR) 10 MEQ tablet Take 1 tablet (10 mEq total) by mouth daily. Take 1 additional tablet on the days you take an extra Lasix.  30 tablet  3  . pravastatin (PRAVACHOL) 40 MG tablet Take 1 tablet (40 mg total) by mouth daily.  30 tablet  1  . predniSONE (DELTASONE) 5 MG tablet Take 5 mg by mouth daily with breakfast.      . ranitidine (ZANTAC) 150 MG tablet Take 150 mg by mouth every morning.       . ticagrelor (BRILINTA) 90 MG TABS tablet Take 90 mg by mouth 2 (two) times daily.      Marland Kitchen tiotropium (SPIRIVA) 18 MCG inhalation capsule Place 18 mcg into inhaler and inhale daily.       No current facility-administered medications for this visit.    History   Social History  . Marital Status: Married    Spouse Name: N/A    Number of Children: N/A  . Years of Education: N/A   Occupational History  . Retirede    Social History Main Topics  . Smoking status: Former Smoker    Quit date: 10/31/1999  . Smokeless tobacco: Never Used  . Alcohol Use: No  . Drug Use: No  . Sexual Activity: Not on file   Other Topics Concern  . Not on file   Social History Narrative   She is a single mother of 7 (no note of being  added divorced or without) 10 grandchildren and 15 great-grandchildren. She lives with her son. Either one of 2 sons or the daughter come with her to appointments. They carry a notebook complete all her medical data. She does all her activities of daily living. She is a former smoker smoked 20 packs a day for 30 years and quit in 2001. She did not drink alcohol. He does not get routine exercise due to dyspnea.   She is a former Writer for [employer]    Family History  Problem Relation Age of Onset  . Breast cancer Mother   . Heart disease      ROS General: Negative; No fevers, chills, or night sweats HEENT: Negative; No changes in vision or hearing, sinus congestion, difficulty swallowing Pulmonary: Negative; No cough, wheezing, shortness of breath, hemoptysis Cardiovascular: See HPI: No chest pain, presyncope, syncope, palpitations Positive for hyperlipidemia GI: Negative; No nausea, vomiting, diarrhea, or abdominal pain GU: Negative; No dysuria, hematuria, or difficulty voiding Musculoskeletal: Negative; no myalgias, joint pain, or weakness Hematologic: Negative; no easy bruising, bleeding Endocrine: Negative; no heat/cold intolerance; no diabetes, Neuro: Negative; no changes in balance, headaches Skin: Negative; No rashes or skin lesions Psychiatric: Positive for depression No behavioral problems, Sleep: Negative; No snoring,  daytime sleepiness, hypersomnolence, bruxism, restless legs, hypnogognic hallucinations. Other comprehensive 14 point system review is negative  Physical Exam BP 126/68  Pulse 71  Ht  (1.626 m)  Wt 191 lb (86.637 kg)  BMI 32.77 kg/m2 General: Alert, oriented, no distress.  Skin: normal turgor, no rashes, warm and dry HEENT: Normocephalic, atraumatic. Pupils equal round and reactive to light; sclera anicteric; extraocular muscles intact, No lid lag; Nose without nasal septal hypertrophy; Mouth/Parynx benign; Mallinpatti scale 3 Neck: No JVD, no  carotid bruits; normal carotid upstroke Lungs: clear to ausculatation and percussion bilaterally; no wheezing or rales, normal inspiratory and expiratory effort Chest wall: without tenderness to palpitation Heart: PMI not displaced, RRR, s1 s2 normal, 1/6 systolic murmur, No diastolic murmur, no rubs, gallops, thrills, or heaves Abdomen: soft, nontender; no hepatosplenomehaly, BS+; abdominal aorta nontender and not dilated by palpation. Back: no CVA tenderness Pulses: 2+  Musculoskeletal: full range of motion, normal strength, no joint deformities Extremities: Pulses 2+, no clubbing cyanosis or edema, Homan's sign negative  Neurologic: grossly nonfocal; Cranial nerves grossly wnl Psychologic: Normal mood and affect  ECG (independently read by me): Normal sinus rhythm at 71 beats per minute.  Nonspecific ST changes.  04/20/2014 ECG (independently read by me): Normal sinus rhythm at 85 beats per minute.  Nonspecific ST changes.  LABS:  BMET    Component Value Date/Time   NA 139 04/09/2014 0444   K 3.5* 04/09/2014 0444   CL 98 04/09/2014 0444   CO2 23 04/09/2014 0444   GLUCOSE 189* 04/09/2014 0444   BUN 19 04/09/2014 0444   CREATININE 1.35* 04/09/2014 0444   CALCIUM 9.2 04/09/2014 0444   GFRNONAA 38* 04/09/2014 0444   GFRAA 44* 04/09/2014 0444     Hepatic Function Panel     Component Value Date/Time   PROT 7.0 12/31/2013 0520   ALBUMIN 3.4* 12/31/2013 0520   AST 16 12/31/2013 0520   ALT 13 12/31/2013 0520   ALKPHOS 56 12/31/2013 0520   BILITOT 0.3 12/31/2013 0520   BILIDIR 0.1 11/02/2010 1300     CBC    Component Value Date/Time   WBC 10.7* 04/09/2014 0444   RBC 4.19 04/09/2014 0444   HGB 11.1* 04/09/2014 0444   HCT 34.5* 04/09/2014 0444   PLT 218 04/09/2014 0444   MCV 82.3 04/09/2014 0444   MCH 26.5 04/09/2014 0444   MCHC 32.2 04/09/2014 0444   RDW 14.3 04/09/2014 0444   LYMPHSABS 0.5* 12/31/2013 0520   MONOABS 0.1 12/31/2013 0520   EOSABS 0.0 12/31/2013 0520   BASOSABS 0.0 12/31/2013 0520      BNP    Component Value Date/Time   PROBNP 320.8* 12/30/2013 1728    Lipid Panel     Component Value Date/Time   CHOL 215* 01/01/2014 0332   TRIG 256* 01/01/2014 0332   HDL 54 01/01/2014 0332   CHOLHDL 4.0 01/01/2014 0332   VLDL 51* 01/01/2014 0332   LDLCALC 110* 01/01/2014 0332     RADIOLOGY: Dg Ribs Unilateral W/chest Right  04/08/2014   CLINICAL DATA:  Fall, right rib pain  EXAM: RIGHT RIBS AND CHEST - 3+ VIEW  COMPARISON:  04/07/2014  FINDINGS: Three views right ribs submitted. No acute infiltrate or pulmonary edema. No rib fracture. No pneumothorax.  IMPRESSION: Negative.   Electronically Signed   By: Natasha Mead M.D.   On: 04/08/2014 09:13      ASSESSMENT AND PLAN: Ms.Cacho is a 73 year old female, who is 12 years status post prior intervention to her LAD and RCA with bare-metal stents.  She developed unstable angina symptoms in March 2015 and  underwent staged.  DES PCI of the diagonal vessel, as well as the mid right coronary artery.  Due to recurrent chest pain repeat catheterization revealed previous stented segments to be patent.  However, she did have progressive disease and a small third right posterolateral branch for which initial medical therapy was recommended.  When I last saw her, and further titrated.  Her carvedilol to 12.5 mg twice a day.  I have recommended to make certain that she is not also taking a systolic, which should be discontinued.  She will also checked to make certain she is taking the prescribed amlodipine 5 mg.  I am further titrated isosorbide mononitrate from 30 mg to 60 mg improved antianginal benefit.  Review of her lipids from March also indicated that her LDL is still not at target.  In the, past.  She's had some difficulty with maximal dose statin therapy.  For this reason, I elected to add Zetia 10 mg to her current dose of pravastatin 40 mg for more optimal lipid therapy.  I will see her in 4 months for followup evaluation and prior to that evaluation  repeat laboratory will be recommended.   Lennette Bihari, MD, [facility]  07/23/2014 9:53 AM

## 2014-07-23 NOTE — Patient Instructions (Addendum)
Stop Bystolic  Continue Coreg  Start  Imdur to 60 mg daily  Start Zetia 10 mg daily   Your physician wants you to follow-up in: 4 months with fasting lab 1 week before appointment. You will receive a reminder letter in the mail two months in advance. If you don't receive a letter, please call our office to schedule the follow-up appointment.

## 2014-07-24 ENCOUNTER — Ambulatory Visit (INDEPENDENT_AMBULATORY_CARE_PROVIDER_SITE_OTHER): Payer: Medicare Other

## 2014-07-24 DIAGNOSIS — J45909 Unspecified asthma, uncomplicated: Secondary | ICD-10-CM

## 2014-07-24 MED ORDER — OMALIZUMAB 150 MG ~~LOC~~ SOLR
375.0000 mg | Freq: Once | SUBCUTANEOUS | Status: AC
  Administered 2014-07-24: 375 mg via SUBCUTANEOUS

## 2014-08-05 ENCOUNTER — Ambulatory Visit: Payer: Medicare Other

## 2014-08-05 ENCOUNTER — Encounter: Payer: Self-pay | Admitting: Internal Medicine

## 2014-08-07 ENCOUNTER — Ambulatory Visit: Payer: Medicare Other

## 2014-08-11 ENCOUNTER — Ambulatory Visit (INDEPENDENT_AMBULATORY_CARE_PROVIDER_SITE_OTHER): Payer: Medicare Other

## 2014-08-11 DIAGNOSIS — J4540 Moderate persistent asthma, uncomplicated: Secondary | ICD-10-CM

## 2014-08-19 ENCOUNTER — Ambulatory Visit: Payer: Medicare Other

## 2014-08-19 MED ORDER — OMALIZUMAB 150 MG ~~LOC~~ SOLR
375.0000 mg | Freq: Once | SUBCUTANEOUS | Status: AC
  Administered 2014-08-19: 375 mg via SUBCUTANEOUS

## 2014-08-20 ENCOUNTER — Other Ambulatory Visit: Payer: Self-pay | Admitting: Physician Assistant

## 2014-08-22 NOTE — Telephone Encounter (Signed)
Rx was sent to pharmacy electronically. 

## 2014-08-25 ENCOUNTER — Ambulatory Visit (INDEPENDENT_AMBULATORY_CARE_PROVIDER_SITE_OTHER): Payer: Medicare Other

## 2014-08-25 DIAGNOSIS — J4540 Moderate persistent asthma, uncomplicated: Secondary | ICD-10-CM

## 2014-09-04 MED ORDER — OMALIZUMAB 150 MG ~~LOC~~ SOLR
375.0000 mg | Freq: Once | SUBCUTANEOUS | Status: AC
  Administered 2014-09-04: 375 mg via SUBCUTANEOUS

## 2014-09-08 ENCOUNTER — Ambulatory Visit (INDEPENDENT_AMBULATORY_CARE_PROVIDER_SITE_OTHER): Payer: Medicare Other

## 2014-09-08 DIAGNOSIS — J45901 Unspecified asthma with (acute) exacerbation: Secondary | ICD-10-CM

## 2014-09-15 MED ORDER — OMALIZUMAB 150 MG ~~LOC~~ SOLR
375.0000 mg | Freq: Once | SUBCUTANEOUS | Status: AC
  Administered 2014-09-08: 375 mg via SUBCUTANEOUS

## 2014-09-22 ENCOUNTER — Ambulatory Visit (INDEPENDENT_AMBULATORY_CARE_PROVIDER_SITE_OTHER): Payer: Medicare Other

## 2014-09-22 DIAGNOSIS — J4520 Mild intermittent asthma, uncomplicated: Secondary | ICD-10-CM

## 2014-09-23 MED ORDER — OMALIZUMAB 150 MG ~~LOC~~ SOLR
375.0000 mg | Freq: Once | SUBCUTANEOUS | Status: AC
  Administered 2014-09-22: 375 mg via SUBCUTANEOUS

## 2014-10-06 ENCOUNTER — Ambulatory Visit (INDEPENDENT_AMBULATORY_CARE_PROVIDER_SITE_OTHER): Payer: Medicare Other

## 2014-10-06 DIAGNOSIS — J4520 Mild intermittent asthma, uncomplicated: Secondary | ICD-10-CM

## 2014-10-07 MED ORDER — OMALIZUMAB 150 MG ~~LOC~~ SOLR
375.0000 mg | Freq: Once | SUBCUTANEOUS | Status: AC
  Administered 2014-10-07: 375 mg via SUBCUTANEOUS

## 2014-10-08 ENCOUNTER — Encounter (HOSPITAL_COMMUNITY): Payer: Self-pay | Admitting: Cardiology

## 2014-10-20 ENCOUNTER — Ambulatory Visit (INDEPENDENT_AMBULATORY_CARE_PROVIDER_SITE_OTHER): Payer: Medicare Other

## 2014-10-20 DIAGNOSIS — J4520 Mild intermittent asthma, uncomplicated: Secondary | ICD-10-CM

## 2014-10-21 MED ORDER — OMALIZUMAB 150 MG ~~LOC~~ SOLR
375.0000 mg | Freq: Once | SUBCUTANEOUS | Status: AC
  Administered 2014-10-20: 375 mg via SUBCUTANEOUS

## 2014-10-24 ENCOUNTER — Other Ambulatory Visit: Payer: Self-pay | Admitting: Physician Assistant

## 2014-10-26 NOTE — Telephone Encounter (Signed)
Rx refill sent to patient pharmacy   

## 2014-10-28 ENCOUNTER — Telehealth: Payer: Self-pay | Admitting: *Deleted

## 2014-10-28 MED ORDER — ISOSORBIDE MONONITRATE ER 30 MG PO TB24: 30.0000 mg | ORAL_TABLET | Freq: Every day | ORAL | Status: DC

## 2014-10-28 NOTE — Telephone Encounter (Signed)
Prescription e-sent did not connected- address. precsription given by phone- 30 mg isosorbide mon #30  Refill  RN CALLED PATIENT TO CONFIRM MEDICATION DOSAGE. IF NECESSARY, WILL CONTACT PHARMACY BACK.

## 2014-10-29 MED ORDER — CARVEDILOL 12.5 MG PO TABS: 12.5000 mg | ORAL_TABLET | Freq: Two times a day (BID) | ORAL | Status: DC

## 2014-10-29 NOTE — Telephone Encounter (Signed)
Left message on voicemail to return call.

## 2014-10-29 NOTE — Telephone Encounter (Signed)
Son return called. He states patient has been filling isosorbide mon. 30 mg on a monthly basis. RN inform to continue.  Son states patient  Needs a refill for carvedilol 12.5 mg. RN informed son, will call prescription into pharmacy. RX called prescription left message on pharmacy line

## 2014-11-03 ENCOUNTER — Ambulatory Visit (INDEPENDENT_AMBULATORY_CARE_PROVIDER_SITE_OTHER): Payer: Medicare Other

## 2014-11-03 DIAGNOSIS — J4520 Mild intermittent asthma, uncomplicated: Secondary | ICD-10-CM

## 2014-11-04 LAB — COMPREHENSIVE METABOLIC PANEL
ALT: 9 U/L (ref 0–35)
AST: 13 U/L (ref 0–37)
Albumin: 4.1 g/dL (ref 3.5–5.2)
Alkaline Phosphatase: 63 U/L (ref 39–117)
BILIRUBIN TOTAL: 0.5 mg/dL (ref 0.2–1.2)
BUN: 13 mg/dL (ref 6–23)
CO2: 28 mEq/L (ref 19–32)
Calcium: 10.3 mg/dL (ref 8.4–10.5)
Chloride: 105 mEq/L (ref 96–112)
Creat: 1.26 mg/dL — ABNORMAL HIGH (ref 0.50–1.10)
Glucose, Bld: 147 mg/dL — ABNORMAL HIGH (ref 70–99)
Potassium: 4.4 mEq/L (ref 3.5–5.3)
Sodium: 142 mEq/L (ref 135–145)
Total Protein: 7 g/dL (ref 6.0–8.3)

## 2014-11-04 LAB — LIPID PANEL
Cholesterol: 176 mg/dL (ref 0–200)
HDL: 45 mg/dL (ref >39)
LDL Cholesterol: 73 mg/dL (ref 0–99)
TRIGLYCERIDES: 292 mg/dL — AB (ref <150)
Total CHOL/HDL Ratio: 3.9 Ratio
VLDL: 58 mg/dL — ABNORMAL HIGH (ref 0–40)

## 2014-11-04 MED ORDER — OMALIZUMAB 150 MG ~~LOC~~ SOLR
375.0000 mg | Freq: Once | SUBCUTANEOUS | Status: AC
  Administered 2014-11-03: 375 mg via SUBCUTANEOUS

## 2014-11-17 ENCOUNTER — Other Ambulatory Visit: Payer: Self-pay | Admitting: *Deleted

## 2014-11-17 ENCOUNTER — Telehealth: Payer: Self-pay | Admitting: *Deleted

## 2014-11-17 ENCOUNTER — Ambulatory Visit: Payer: Medicare Other

## 2014-11-17 DIAGNOSIS — Z79899 Other long term (current) drug therapy: Secondary | ICD-10-CM

## 2014-11-17 DIAGNOSIS — E785 Hyperlipidemia, unspecified: Secondary | ICD-10-CM

## 2014-11-17 MED ORDER — FENOFIBRATE 145 MG PO TABS: 145.0000 mg | ORAL_TABLET | Freq: Every day | ORAL | Status: DC

## 2014-11-17 NOTE — Telephone Encounter (Signed)
-----   Message from Lennette Biharihomas A Kelly, MD sent at 11/08/2014 12:52 PM EST ----- Renal fxn is better;  Chol, ldl better on zetia/pravastatin.  Add fenofibrate 145 mg

## 2014-11-17 NOTE — Telephone Encounter (Signed)
Informed patient's son of lab results and recommendations. He expressed understanding and requested for [facility]ibr[facility] [place]. Informed him also that I will send a lab slip to have blood rechecked again in 2 months.

## 2014-11-20 ENCOUNTER — Ambulatory Visit: Payer: Medicare Other

## 2014-11-27 ENCOUNTER — Ambulatory Visit (INDEPENDENT_AMBULATORY_CARE_PROVIDER_SITE_OTHER): Payer: Medicare Other

## 2014-11-27 DIAGNOSIS — J4520 Mild intermittent asthma, uncomplicated: Secondary | ICD-10-CM

## 2014-11-27 MED ORDER — OMALIZUMAB 150 MG ~~LOC~~ SOLR
375.0000 mg | Freq: Once | SUBCUTANEOUS | Status: AC
  Administered 2014-11-27: 375 mg via SUBCUTANEOUS

## 2014-12-11 ENCOUNTER — Ambulatory Visit (INDEPENDENT_AMBULATORY_CARE_PROVIDER_SITE_OTHER): Payer: Medicare Other

## 2014-12-11 DIAGNOSIS — J4540 Moderate persistent asthma, uncomplicated: Secondary | ICD-10-CM

## 2014-12-21 MED ORDER — OMALIZUMAB 150 MG ~~LOC~~ SOLR
375.0000 mg | Freq: Once | SUBCUTANEOUS | Status: AC
  Administered 2014-12-11: 375 mg via SUBCUTANEOUS

## 2014-12-25 ENCOUNTER — Ambulatory Visit (INDEPENDENT_AMBULATORY_CARE_PROVIDER_SITE_OTHER): Payer: Medicare Other

## 2014-12-25 DIAGNOSIS — J45901 Unspecified asthma with (acute) exacerbation: Secondary | ICD-10-CM

## 2014-12-25 MED ORDER — OMALIZUMAB 150 MG ~~LOC~~ SOLR
375.0000 mg | Freq: Once | SUBCUTANEOUS | Status: AC
  Administered 2014-12-25: 375 mg via SUBCUTANEOUS

## 2015-01-08 ENCOUNTER — Ambulatory Visit (INDEPENDENT_AMBULATORY_CARE_PROVIDER_SITE_OTHER): Payer: Medicare Other

## 2015-01-08 DIAGNOSIS — J4540 Moderate persistent asthma, uncomplicated: Secondary | ICD-10-CM | POA: Diagnosis not present

## 2015-01-11 MED ORDER — OMALIZUMAB 150 MG ~~LOC~~ SOLR
375.0000 mg | Freq: Once | SUBCUTANEOUS | Status: AC
  Administered 2015-01-08: 375 mg via SUBCUTANEOUS

## 2015-01-25 ENCOUNTER — Ambulatory Visit (INDEPENDENT_AMBULATORY_CARE_PROVIDER_SITE_OTHER): Payer: Medicare Other

## 2015-01-25 DIAGNOSIS — J45901 Unspecified asthma with (acute) exacerbation: Secondary | ICD-10-CM | POA: Diagnosis not present

## 2015-01-25 MED ORDER — OMALIZUMAB 150 MG ~~LOC~~ SOLR
375.0000 mg | Freq: Once | SUBCUTANEOUS | Status: AC
  Administered 2015-01-25: 375 mg via SUBCUTANEOUS

## 2015-02-08 ENCOUNTER — Ambulatory Visit (INDEPENDENT_AMBULATORY_CARE_PROVIDER_SITE_OTHER): Payer: Medicare Other

## 2015-02-08 DIAGNOSIS — J4520 Mild intermittent asthma, uncomplicated: Secondary | ICD-10-CM

## 2015-02-08 MED ORDER — OMALIZUMAB 150 MG ~~LOC~~ SOLR
375.0000 mg | Freq: Once | SUBCUTANEOUS | Status: AC
  Administered 2015-02-08: 375 mg via SUBCUTANEOUS

## 2015-02-09 LAB — LIPID PANEL
CHOL/HDL RATIO: 4 ratio
Cholesterol: 186 mg/dL (ref 0–200)
HDL: 47 mg/dL (ref >=46)
LDL Cholesterol: 98 mg/dL (ref 0–99)
Triglycerides: 205 mg/dL — ABNORMAL HIGH (ref <150)
VLDL: 41 mg/dL — AB (ref 0–40)

## 2015-02-09 LAB — COMPREHENSIVE METABOLIC PANEL
ALBUMIN: 4.3 g/dL (ref 3.5–5.2)
ALT: 14 U/L (ref 0–35)
AST: 21 U/L (ref 0–37)
Alkaline Phosphatase: 42 U/L (ref 39–117)
BUN: 18 mg/dL (ref 6–23)
CALCIUM: 9.8 mg/dL (ref 8.4–10.5)
CO2: 25 meq/L (ref 19–32)
CREATININE: 1.59 mg/dL — AB (ref 0.50–1.10)
Chloride: 103 mEq/L (ref 96–112)
Glucose, Bld: 152 mg/dL — ABNORMAL HIGH (ref 70–99)
Potassium: 5.1 mEq/L (ref 3.5–5.3)
Sodium: 138 mEq/L (ref 135–145)
Total Bilirubin: 0.6 mg/dL (ref 0.2–1.2)
Total Protein: 7.3 g/dL (ref 6.0–8.3)

## 2015-02-25 ENCOUNTER — Other Ambulatory Visit: Payer: Self-pay | Admitting: Cardiology

## 2015-02-26 ENCOUNTER — Other Ambulatory Visit: Payer: Self-pay | Admitting: *Deleted

## 2015-02-26 ENCOUNTER — Ambulatory Visit (INDEPENDENT_AMBULATORY_CARE_PROVIDER_SITE_OTHER): Payer: Medicare Other

## 2015-02-26 DIAGNOSIS — J4540 Moderate persistent asthma, uncomplicated: Secondary | ICD-10-CM | POA: Diagnosis not present

## 2015-02-26 MED ORDER — TICAGRELOR 90 MG PO TABS: 90.0000 mg | ORAL_TABLET | Freq: Two times a day (BID) | ORAL | Status: DC

## 2015-02-26 NOTE — Telephone Encounter (Signed)
Rx(s) sent to pharmacy electronically.  

## 2015-03-01 MED ORDER — OMALIZUMAB 150 MG ~~LOC~~ SOLR
375.0000 mg | Freq: Once | SUBCUTANEOUS | Status: AC
  Administered 2015-02-26: 375 mg via SUBCUTANEOUS

## 2015-03-12 ENCOUNTER — Ambulatory Visit (INDEPENDENT_AMBULATORY_CARE_PROVIDER_SITE_OTHER): Payer: Medicare Other

## 2015-03-12 DIAGNOSIS — J4540 Moderate persistent asthma, uncomplicated: Secondary | ICD-10-CM | POA: Diagnosis not present

## 2015-03-15 MED ORDER — OMALIZUMAB 150 MG ~~LOC~~ SOLR
375.0000 mg | Freq: Once | SUBCUTANEOUS | Status: AC
  Administered 2015-03-12: 375 mg via SUBCUTANEOUS

## 2015-03-18 ENCOUNTER — Telehealth: Payer: Self-pay | Admitting: *Deleted

## 2015-03-18 NOTE — Telephone Encounter (Signed)
-----   Message from Lennette Biharihomas A Kelly, MD sent at 03/16/2015 12:17 PM EDT ----- Decrease lasix to 20 mg; lipids better with zetia

## 2015-03-25 ENCOUNTER — Telehealth: Payer: Self-pay | Admitting: *Deleted

## 2015-03-25 ENCOUNTER — Telehealth: Payer: Self-pay | Admitting: Pulmonary Disease

## 2015-03-25 NOTE — Telephone Encounter (Signed)
-----   Message from Thomas A Kelly, MD sent at 03/16/2015 12:17 PM EDT ----- Decrease lasix to 20 mg; lipids better with zetia 

## 2015-03-25 NOTE — Telephone Encounter (Signed)
Left message to return a call to me. 

## 2015-03-26 ENCOUNTER — Encounter: Payer: Self-pay | Admitting: *Deleted

## 2015-03-26 ENCOUNTER — Ambulatory Visit (INDEPENDENT_AMBULATORY_CARE_PROVIDER_SITE_OTHER): Payer: Medicare Other

## 2015-03-26 DIAGNOSIS — J4540 Moderate persistent asthma, uncomplicated: Secondary | ICD-10-CM | POA: Diagnosis not present

## 2015-03-26 NOTE — Telephone Encounter (Signed)
#   vials:6 Ordered date:03/24/15 Shipping Date:03/25/15    # Vials:6 Arrival Date:03/25/15 Lot [id number] Exp Date:12/19

## 2015-03-26 NOTE — Progress Notes (Signed)
Unsuccessful attempts to make contact with patient. Note sent to patient.

## 2015-03-31 MED ORDER — OMALIZUMAB 150 MG ~~LOC~~ SOLR
375.0000 mg | Freq: Once | SUBCUTANEOUS | Status: AC
  Administered 2015-03-26: 375 mg via SUBCUTANEOUS

## 2015-04-02 ENCOUNTER — Telehealth: Payer: Self-pay | Admitting: *Deleted

## 2015-04-02 NOTE — Telephone Encounter (Signed)
Spoke with patient notified her of lab results and recommendations. She voiced her understanding.

## 2015-04-02 NOTE — Telephone Encounter (Signed)
-----   Message from Lennette Biharihomas A Kelly, MD sent at 03/16/2015 12:17 PM EDT ----- Decrease lasix to 20 mg; lipids better with zetia

## 2015-04-09 ENCOUNTER — Ambulatory Visit (INDEPENDENT_AMBULATORY_CARE_PROVIDER_SITE_OTHER): Payer: Medicare Other

## 2015-04-09 DIAGNOSIS — J4540 Moderate persistent asthma, uncomplicated: Secondary | ICD-10-CM | POA: Diagnosis not present

## 2015-04-12 MED ORDER — OMALIZUMAB 150 MG ~~LOC~~ SOLR
375.0000 mg | Freq: Once | SUBCUTANEOUS | Status: AC
  Administered 2015-04-09: 375 mg via SUBCUTANEOUS

## 2015-04-16 ENCOUNTER — Telehealth: Payer: Self-pay | Admitting: Pulmonary Disease

## 2015-04-19 NOTE — Telephone Encounter (Signed)
#   vials:6 Ordered date:04/16/15  Shipping Date:04/19/15

## 2015-04-20 NOTE — Telephone Encounter (Signed)
#   Vials:6 Arrival Date:04/20/15 Lot #[id number] Exp Date:1/20

## 2015-04-23 ENCOUNTER — Ambulatory Visit (INDEPENDENT_AMBULATORY_CARE_PROVIDER_SITE_OTHER): Payer: Medicare Other

## 2015-04-23 ENCOUNTER — Other Ambulatory Visit: Payer: Self-pay | Admitting: Cardiology

## 2015-04-23 DIAGNOSIS — J4520 Mild intermittent asthma, uncomplicated: Secondary | ICD-10-CM | POA: Diagnosis not present

## 2015-04-26 MED ORDER — ISOSORBIDE MONONITRATE ER 30 MG PO TB24: 30.0000 mg | ORAL_TABLET | Freq: Every day | ORAL | Status: DC

## 2015-04-26 NOTE — Telephone Encounter (Signed)
Rx(s) sent to pharmacy electronically.  

## 2015-04-26 NOTE — Addendum Note (Signed)
Addended by: Neta EhlersRUITT, Alnisa Hasley M on: 04/26/2015 12:24 PM   Modules accepted: Orders

## 2015-04-27 MED ORDER — OMALIZUMAB 150 MG ~~LOC~~ SOLR
375.0000 mg | Freq: Once | SUBCUTANEOUS | Status: AC
  Administered 2015-04-23: 375 mg via SUBCUTANEOUS

## 2015-05-07 ENCOUNTER — Ambulatory Visit (INDEPENDENT_AMBULATORY_CARE_PROVIDER_SITE_OTHER): Payer: Medicare Other

## 2015-05-07 DIAGNOSIS — J4520 Mild intermittent asthma, uncomplicated: Secondary | ICD-10-CM | POA: Diagnosis not present

## 2015-05-07 MED ORDER — OMALIZUMAB 150 MG ~~LOC~~ SOLR
375.0000 mg | Freq: Once | SUBCUTANEOUS | Status: AC
  Administered 2015-05-07: 375 mg via SUBCUTANEOUS

## 2015-05-11 ENCOUNTER — Encounter: Payer: Self-pay | Admitting: Internal Medicine

## 2015-05-11 ENCOUNTER — Telehealth: Payer: Self-pay | Admitting: Pulmonary Disease

## 2015-05-11 NOTE — Telephone Encounter (Signed)
#   vials:6 Ordered date:05/11/15 Shipping Date:05/12/15 

## 2015-05-12 NOTE — Telephone Encounter (Signed)
#   Vials:6 Arrival Date:05/12/15 Lot #[id number] Exp Date:1/20  

## 2015-05-21 ENCOUNTER — Ambulatory Visit (INDEPENDENT_AMBULATORY_CARE_PROVIDER_SITE_OTHER): Payer: Medicare Other

## 2015-05-21 DIAGNOSIS — J4540 Moderate persistent asthma, uncomplicated: Secondary | ICD-10-CM | POA: Diagnosis not present

## 2015-05-24 MED ORDER — OMALIZUMAB 150 MG ~~LOC~~ SOLR
375.0000 mg | Freq: Once | SUBCUTANEOUS | Status: AC
  Administered 2015-05-21: 375 mg via SUBCUTANEOUS

## 2015-05-29 ENCOUNTER — Other Ambulatory Visit: Payer: Self-pay | Admitting: Cardiovascular Disease

## 2015-05-30 ENCOUNTER — Observation Stay (HOSPITAL_COMMUNITY): Payer: Medicare Other

## 2015-05-30 ENCOUNTER — Observation Stay (HOSPITAL_COMMUNITY)
Admission: EM | Admit: 2015-05-30 | Discharge: 2015-06-01 | Disposition: A | Discharge status: Home / Self Care | Payer: Medicare Other | Attending: Internal Medicine | Admitting: Internal Medicine

## 2015-05-30 ENCOUNTER — Encounter (HOSPITAL_COMMUNITY): Payer: Self-pay | Admitting: *Deleted

## 2015-05-30 DIAGNOSIS — H5441 Blindness, right eye, normal vision left eye: Secondary | ICD-10-CM | POA: Diagnosis not present

## 2015-05-30 DIAGNOSIS — I252 Old myocardial infarction: Secondary | ICD-10-CM | POA: Insufficient documentation

## 2015-05-30 DIAGNOSIS — E119 Type 2 diabetes mellitus without complications: Secondary | ICD-10-CM

## 2015-05-30 DIAGNOSIS — H5712 Ocular pain, left eye: Secondary | ICD-10-CM | POA: Insufficient documentation

## 2015-05-30 DIAGNOSIS — N183 Chronic kidney disease, stage 3 unspecified: Secondary | ICD-10-CM | POA: Diagnosis present

## 2015-05-30 DIAGNOSIS — J45909 Unspecified asthma, uncomplicated: Secondary | ICD-10-CM | POA: Diagnosis not present

## 2015-05-30 DIAGNOSIS — E7800 Pure hypercholesterolemia, unspecified: Secondary | ICD-10-CM | POA: Diagnosis present

## 2015-05-30 DIAGNOSIS — J449 Chronic obstructive pulmonary disease, unspecified: Secondary | ICD-10-CM | POA: Diagnosis not present

## 2015-05-30 DIAGNOSIS — S0502XA Injury of conjunctiva and corneal abrasion without foreign body, left eye, initial encounter: Secondary | ICD-10-CM

## 2015-05-30 DIAGNOSIS — I129 Hypertensive chronic kidney disease with stage 1 through stage 4 chronic kidney disease, or unspecified chronic kidney disease: Secondary | ICD-10-CM | POA: Diagnosis not present

## 2015-05-30 DIAGNOSIS — I10 Essential (primary) hypertension: Secondary | ICD-10-CM | POA: Diagnosis present

## 2015-05-30 DIAGNOSIS — N289 Disorder of kidney and ureter, unspecified: Secondary | ICD-10-CM | POA: Diagnosis present

## 2015-05-30 DIAGNOSIS — E860 Dehydration: Secondary | ICD-10-CM | POA: Insufficient documentation

## 2015-05-30 DIAGNOSIS — I2510 Atherosclerotic heart disease of native coronary artery without angina pectoris: Secondary | ICD-10-CM | POA: Diagnosis not present

## 2015-05-30 DIAGNOSIS — N179 Acute kidney failure, unspecified: Principal | ICD-10-CM | POA: Insufficient documentation

## 2015-05-30 DIAGNOSIS — Z955 Presence of coronary angioplasty implant and graft: Secondary | ICD-10-CM | POA: Insufficient documentation

## 2015-05-30 DIAGNOSIS — F411 Generalized anxiety disorder: Secondary | ICD-10-CM | POA: Diagnosis present

## 2015-05-30 LAB — CBC
HCT: 30.7 % — ABNORMAL LOW (ref 36.0–46.0)
HEMOGLOBIN: 10.1 g/dL — AB (ref 12.0–15.0)
MCH: 25.4 pg — ABNORMAL LOW (ref 26.0–34.0)
MCHC: 32.9 g/dL (ref 30.0–36.0)
MCV: 77.1 fL — ABNORMAL LOW (ref 78.0–100.0)
Platelets: 333 10*3/uL (ref 150–400)
RBC: 3.98 MIL/uL (ref 3.87–5.11)
RDW: 15.4 % (ref 11.5–15.5)
WBC: 8.9 10*3/uL (ref 4.0–10.5)

## 2015-05-30 LAB — BASIC METABOLIC PANEL
Anion gap: 14 (ref 5–15)
BUN: 29 mg/dL — AB (ref 6–20)
CO2: 19 mmol/L — ABNORMAL LOW (ref 22–32)
Calcium: 9 mg/dL (ref 8.9–10.3)
Chloride: 104 mmol/L (ref 101–111)
Creatinine, Ser: 2.65 mg/dL — ABNORMAL HIGH (ref 0.44–1.00)
GFR calc Af Amer: 19 mL/min — ABNORMAL LOW (ref >60)
GFR, EST NON AFRICAN AMERICAN: 17 mL/min — AB (ref >60)
Glucose, Bld: 177 mg/dL — ABNORMAL HIGH (ref 65–99)
POTASSIUM: 4.1 mmol/L (ref 3.5–5.1)
Sodium: 137 mmol/L (ref 135–145)

## 2015-05-30 LAB — TROPONIN I: Troponin I: <0.03 ng/mL (ref <0.031)

## 2015-05-30 MED ORDER — TETRACAINE HCL 0.5 % OP SOLN
2.0000 [drp] | Freq: Once | OPHTHALMIC | Status: AC
  Administered 2015-05-30: 2 [drp] via OPHTHALMIC
  Filled 2015-05-30: qty 2

## 2015-05-30 MED ORDER — FUROSEMIDE 20 MG PO TABS
10.0000 mg | ORAL_TABLET | Freq: Every day | ORAL | Status: DC
Start: 2015-05-31 — End: 2015-05-31

## 2015-05-30 MED ORDER — TOBRAMYCIN 0.3 % OP OINT
TOPICAL_OINTMENT | Freq: Four times a day (QID) | OPHTHALMIC | Status: DC
  Administered 2015-05-30 – 2015-05-31 (×5): via OPHTHALMIC
  Administered 2015-06-01: 1 via OPHTHALMIC
  Filled 2015-05-30: qty 3.5

## 2015-05-30 MED ORDER — ACETAMINOPHEN 325 MG PO TABS
325.0000 mg | ORAL_TABLET | Freq: Four times a day (QID) | ORAL | Status: DC | PRN
  Administered 2015-05-31: 650 mg via ORAL
  Filled 2015-05-30: qty 2

## 2015-05-30 MED ORDER — FAMOTIDINE 20 MG PO TABS
20.0000 mg | ORAL_TABLET | Freq: Two times a day (BID) | ORAL | Status: DC
  Administered 2015-05-31 – 2015-06-01 (×4): 20 mg via ORAL
  Filled 2015-05-30 (×4): qty 1

## 2015-05-30 MED ORDER — ONDANSETRON HCL 4 MG/2ML IJ SOLN
4.0000 mg | Freq: Four times a day (QID) | INTRAMUSCULAR | Status: DC | PRN
Start: 2015-05-30 — End: 2015-06-01
  Administered 2015-05-30 – 2015-05-31 (×3): 4 mg via INTRAVENOUS
  Filled 2015-05-30 (×3): qty 2

## 2015-05-30 MED ORDER — ALBUTEROL SULFATE (2.5 MG/3ML) 0.083% IN NEBU: 2.5000 mg | INHALATION_SOLUTION | Freq: Four times a day (QID) | RESPIRATORY_TRACT | Status: DC | PRN

## 2015-05-30 MED ORDER — LORATADINE 10 MG PO TABS
10.0000 mg | ORAL_TABLET | Freq: Every day | ORAL | Status: DC
  Administered 2015-05-31 – 2015-06-01 (×2): 10 mg via ORAL
  Filled 2015-05-30 (×2): qty 1

## 2015-05-30 MED ORDER — BUDESONIDE 0.5 MG/2ML IN SUSP
0.5000 mg | Freq: Two times a day (BID) | RESPIRATORY_TRACT | Status: DC
  Administered 2015-05-31 – 2015-06-01 (×2): 0.5 mg via RESPIRATORY_TRACT
  Filled 2015-05-30 (×4): qty 2

## 2015-05-30 MED ORDER — INSULIN ASPART 100 UNIT/ML ~~LOC~~ SOLN
0.0000 [IU] | Freq: Three times a day (TID) | SUBCUTANEOUS | Status: DC
  Administered 2015-05-31 (×2): 2 [IU] via SUBCUTANEOUS
  Administered 2015-06-01: 1 [IU] via SUBCUTANEOUS

## 2015-05-30 MED ORDER — PRAVASTATIN SODIUM 10 MG PO TABS
40.0000 mg | ORAL_TABLET | Freq: Every evening | ORAL | Status: DC
Start: 2015-05-30 — End: 2015-06-01
  Administered 2015-05-31 (×2): 40 mg via ORAL
  Filled 2015-05-30 (×2): qty 4

## 2015-05-30 MED ORDER — TICAGRELOR 90 MG PO TABS
90.0000 mg | ORAL_TABLET | Freq: Two times a day (BID) | ORAL | Status: DC
  Administered 2015-05-30 – 2015-06-01 (×4): 90 mg via ORAL
  Filled 2015-05-30 (×4): qty 1

## 2015-05-30 MED ORDER — ISOSORBIDE MONONITRATE ER 30 MG PO TB24
30.0000 mg | ORAL_TABLET | Freq: Every day | ORAL | Status: DC
  Administered 2015-05-31 – 2015-06-01 (×2): 30 mg via ORAL
  Filled 2015-05-30 (×2): qty 1

## 2015-05-30 MED ORDER — TIOTROPIUM BROMIDE MONOHYDRATE 18 MCG IN CAPS
18.0000 ug | ORAL_CAPSULE | Freq: Every day | RESPIRATORY_TRACT | Status: DC
  Administered 2015-05-31 – 2015-06-01 (×2): 18 ug via RESPIRATORY_TRACT
  Filled 2015-05-30: qty 5

## 2015-05-30 MED ORDER — ROFLUMILAST 500 MCG PO TABS
500.0000 ug | ORAL_TABLET | Freq: Every day | ORAL | Status: DC
  Administered 2015-05-31 – 2015-06-01 (×2): 500 ug via ORAL
  Filled 2015-05-30 (×2): qty 1

## 2015-05-30 MED ORDER — HYDROCODONE-ACETAMINOPHEN 5-325 MG PO TABS
1.0000 | ORAL_TABLET | Freq: Four times a day (QID) | ORAL | Status: DC | PRN
  Administered 2015-05-31 (×3): 1 via ORAL
  Filled 2015-05-30 (×3): qty 1

## 2015-05-30 MED ORDER — ASPIRIN EC 81 MG PO TBEC
81.0000 mg | DELAYED_RELEASE_TABLET | Freq: Every day | ORAL | Status: DC
Start: 2015-05-31 — End: 2015-06-01
  Administered 2015-05-31 – 2015-06-01 (×2): 81 mg via ORAL
  Filled 2015-05-30 (×2): qty 1

## 2015-05-30 MED ORDER — AMLODIPINE BESYLATE 5 MG PO TABS
5.0000 mg | ORAL_TABLET | Freq: Every day | ORAL | Status: DC
  Administered 2015-05-31 – 2015-06-01 (×2): 5 mg via ORAL
  Filled 2015-05-30 (×2): qty 1

## 2015-05-30 MED ORDER — ONDANSETRON HCL 4 MG PO TABS: 4.0000 mg | ORAL_TABLET | Freq: Four times a day (QID) | ORAL | Status: DC | PRN

## 2015-05-30 MED ORDER — HYDROCODONE-ACETAMINOPHEN 5-325 MG PO TABS
2.0000 | ORAL_TABLET | Freq: Once | ORAL | Status: AC
  Administered 2015-05-30: 2 via ORAL
  Filled 2015-05-30: qty 2

## 2015-05-30 MED ORDER — PREDNISONE 5 MG PO TABS
5.0000 mg | ORAL_TABLET | Freq: Every day | ORAL | Status: DC
  Administered 2015-05-31 – 2015-06-01 (×2): 5 mg via ORAL
  Filled 2015-05-30 (×2): qty 1

## 2015-05-30 MED ORDER — NITROGLYCERIN 0.4 MG SL SUBL: 0.4000 mg | SUBLINGUAL_TABLET | SUBLINGUAL | Status: DC | PRN

## 2015-05-30 MED ORDER — LORAZEPAM 1 MG PO TABS
1.0000 mg | ORAL_TABLET | Freq: Two times a day (BID) | ORAL | Status: DC | PRN
  Administered 2015-05-31: 1 mg via ORAL
  Filled 2015-05-30: qty 1

## 2015-05-30 MED ORDER — FLUORESCEIN SODIUM 1 MG OP STRP
1.0000 | ORAL_STRIP | Freq: Once | OPHTHALMIC | Status: AC
  Administered 2015-05-30: 1 via OPHTHALMIC
  Filled 2015-05-30: qty 1

## 2015-05-30 MED ORDER — SODIUM CHLORIDE 0.9 % IV SOLN
INTRAVENOUS | Status: DC
  Administered 2015-05-30: 23:00:00 via INTRAVENOUS
  Administered 2015-05-31: 1 mL via INTRAVENOUS

## 2015-05-30 MED ORDER — TETRACAINE HCL 0.5 % OP SOLN
2.0000 [drp] | Freq: Four times a day (QID) | OPHTHALMIC | Status: DC | PRN
  Administered 2015-05-30 – 2015-06-01 (×5): 2 [drp] via OPHTHALMIC
  Filled 2015-05-30 (×2): qty 2

## 2015-05-30 MED ORDER — INSULIN GLARGINE 100 UNIT/ML ~~LOC~~ SOLN
30.0000 [IU] | Freq: Every day | SUBCUTANEOUS | Status: DC
  Administered 2015-05-30 – 2015-05-31 (×2): 30 [IU] via SUBCUTANEOUS
  Filled 2015-05-30 (×4): qty 0.3

## 2015-05-30 MED ORDER — FLUOXETINE HCL 20 MG PO TABS
20.0000 mg | ORAL_TABLET | Freq: Every day | ORAL | Status: DC
  Administered 2015-05-31 – 2015-06-01 (×2): 20 mg via ORAL
  Filled 2015-05-30 (×4): qty 1

## 2015-05-30 MED ORDER — POTASSIUM CHLORIDE ER 10 MEQ PO TBCR
10.0000 meq | EXTENDED_RELEASE_TABLET | Freq: Every day | ORAL | Status: DC
  Administered 2015-05-31 – 2015-06-01 (×2): 10 meq via ORAL
  Filled 2015-05-30 (×4): qty 1

## 2015-05-30 NOTE — ED Notes (Signed)
Pt took shoes, cell phone, jewelry, glasses and shirt to room.  All other belongings are at home.

## 2015-05-30 NOTE — ED Provider Notes (Signed)
CSN: [id number]     Arrival date & time 05/30/15  1657 History   First MD Initiated Contact with Patient 05/30/15 1715     Chief Complaint  Patient presents with  . Arm Pain  . Eye Pain     (Consider location/radiation/quality/duration/timing/severity/associated sxs/prior Treatment) Patient is a 74 y.o. female presenting with eye problem.  Eye Problem Location:  L eye Quality:  Aching Severity:  Severe Onset quality:  Gradual Duration:  1 week Timing:  Constant Progression:  Worsening Chronicity:  New Context comment:  Several months ago, she had cataract surgery.  she then developed bleeding "behind her eyeball" and she lost almost all vision in that eye.   Relieved by: given prednisone drops yesterday. Worsened by:  Bright light Associated symptoms: decreased vision, nausea, numbness (left facial) and photophobia     Past Medical History  Diagnosis Date  . CAD (coronary artery disease)     a. 2003 BMS to RCA & LAD; PTCA of ISR 2003 b.12/2013 - staged DES PCI of the D1 and mRCA. c. 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  Marland Kitchen HLD (hyperlipidemia)   . Hypertension   . Type 2 diabetes mellitus treated with insulin   . COPD (chronic obstructive pulmonary disease)   . Unspecified asthma(493.90)   . Obstructive sleep apnea   . Edema   . GE reflux   . Carotid bruit   . Anxiety   . CKD (chronic kidney disease), stage III     a. Stage III-IV.  . Mild aortic stenosis     a. By cath 04/09/2014.  Marland Kitchen Retroperitoneal bleed     a. Hx of this on lovenox in 2001 (had been admitted for NSTEMI).   Past Surgical History  Procedure Laterality Date  . Appendectomy    . Cardiac catheterization  11/18/1999    Small non-STEMI; moderate 50-60% RCA lesion treated medically secondary to percutaneous RP bleed  . Percutaneous coronary stent intervention (pci-s)  02/17/2002    Two-vessel PCI: 80% mid LAD -- 2.25 mm x 8 mm Express 2 BMS; in RCA  RCA 80% 3.5 mm x 12 mm Express 2 BMS  . Coronary angioplasty   04/29/2002    Cutting Balloon PTCA of LAD stent for ISR with 2.5 mm balloon  . Cardiac catheterization  April 2004, October 2010, December 2011    Stable CAD with mild ISR in LAD and RCA; treated medically  . Cardiac catheterization  01/04/2012    4 small non-STEMI; Myoview with lateral ischemia-- 95% proximal D1, 80% mid RCA post stent -- staged PCI   . Percutaneous coronary stent intervention (pci-s)   03/07 & 06/2012    7th - Two-vessel PCI: 80% mid LAD -- 2.25 mm x 8 mm Express 2 BMS; in RCA RCA 80% 3.5 mm x 12 mm Express 2 BMS; 9th - mid RCA a Xience alpine DES 3.5 mm x 15 mm (postdilated 3.5 mm)   . Transthoracic echocardiogram   01/02/2014    (Most recent) moderate concentric LVH. Normal EF of 65-70%. Grade 1 diastolic dysfunction. Mild AI  . Nm myoview ltd  01/01/2014    EF 64%. The distal anterior/anterolateral ischemia -- referred for cath  . Left heart catheterization with coronary angiogram N/A 01/02/2014    Procedure: LEFT HEART CATHETERIZATION WITH CORONARY ANGIOGRAM;  Surgeon: Marykay Lex, MD;  Location: [facility] CATH LAB;  Service: Cardiovascular;  Laterality: N/A;  . Percutaneous coronary stent intervention (  pci-s) N/A 01/06/2014    Procedure: PERCUTANEOUS CORONARY STENT INTERVENTION (PCI-S);  Surgeon: Marykay Lex, MD;  Location: [facility] CATH LAB;  Service: Cardiovascular;  Laterality: N/A;  . Left heart catheterization with coronary angiogram N/A 04/09/2014    Procedure: LEFT HEART CATHETERIZATION WITH CORONARY ANGIOGRAM;  Surgeon: Marykay Lex, MD;  Location: [facility] CATH LAB;  Service: Cardiovascular;  Laterality: N/A;   Family History  Problem Relation Age of Onset  . Breast cancer Mother   . Heart disease     History  Substance Use Topics  . Smoking status: Former Smoker    Quit date: 10/31/1999  . Smokeless tobacco: Never Used  . Alcohol Use: No   OB History    No data available     Review of Systems   Eyes: Positive for photophobia and pain.  Gastrointestinal: Positive for nausea.  Neurological: Positive for numbness (left facial).  All other systems reviewed and are negative.     Allergies  Atorvastatin; Benzonatate; and Heparin  Home Medications   Prior to Admission medications   Medication Sig Start Date End Date Taking? Authorizing Provider  acetaminophen (TYLENOL) 325 MG tablet Take 650 mg by mouth every 6 (six) hours as needed for moderate pain.     Historical Provider, MD  albuterol (PROVENTIL) (2.5 MG/3ML) 0.083% nebulizer solution Take 2.5 mg by nebulization every 6 (six) hours as needed for wheezing or shortness of breath.    Historical Provider, MD  Albuterol Sulfate (PROAIR HFA IN) Inhale 2 puffs into the lungs every 4 (four) hours as needed (for shortness of breath).     Historical Provider, MD  amLODipine (NORVASC) 5 MG tablet Take 5 mg by mouth daily.    Historical Provider, MD  aspirin 81 MG tablet Take 81 mg by mouth daily.      Historical Provider, MD  budesonide (PULMICORT) 0.5 MG/2ML nebulizer solution Take 0.5 mg by nebulization 2 (two) times daily.    Historical Provider, MD  carvedilol (COREG) 12.5 MG tablet Take 1 tablet (12.5 mg total) by mouth 2 (two) times daily. 10/29/14   Lennette Bihari, MD  cetirizine (ZYRTEC) 10 MG tablet Take 10 mg by mouth daily.      Historical Provider, MD  ezetimibe (ZETIA) 10 MG tablet Take 1 tablet (10 mg total) by mouth daily. 07/23/14   Lennette Bihari, MD  fenofibrate (TRICOR) 145 MG tablet Take 1 tablet (145 mg total) by mouth daily. 11/17/14   Lennette Bihari, MD  FLUoxetine (PROZAC) 20 MG tablet Take 20 mg by mouth daily.     Historical Provider, MD  furosemide (LASIX) 20 MG tablet Take 20-40 mg by mouth daily.     Historical Provider, MD  insulin glargine (LANTUS) 100 UNIT/ML injection Inject 30 Units into the skin at bedtime.     Historical Provider, MD  insulin lispro (HUMALOG) 100 UNIT/ML injection Inject 0-12 Units into  the skin 3 (three) times daily with meals. If BG less than 150, no insulin given 151-200, takes 3 units 201-250, takes 5 units  251-300, takes 8 units 301-350, takes 10 units 351-400, takes 12 units 400+, call MD    Historical Provider, MD  isosorbide mononitrate (IMDUR) 30 MG 24 hr tablet Take 1 tablet (30 mg total) by mouth daily. NEEDS APPOINTMENT FOR FUTURE REFILLS 04/26/15   Lennette Bihari, MD  isosorbide mononitrate (IMDUR) 60 MG 24 hr tablet Take 1 tablet (60 mg total) by mouth daily. 07/23/14   Lennette Bihari,  MD  LORazepam (ATIVAN) 1 MG tablet Take 1 mg by mouth at bedtime.     Historical Provider, MD  methocarbamol (ROBAXIN) 500 MG tablet Take 500 mg by mouth every 8 (eight) hours as needed for muscle spasms.    Historical Provider, MD  nitroGLYCERIN (NITROSTAT) 0.4 MG SL tablet Place 1 tablet (0.4 mg total) under the tongue every 5 (five) minutes as needed for chest pain. 02/16/14   Marykay Lex, MD  NON FORMULARY Inject 2 each into the muscle every 14 (fourteen) days. "allergy shots-[name]"= due on every other friday    Historical Provider, MD  potassium chloride (K-DUR) 10 MEQ tablet TAKE 1 TABLET BY MOUTH EVERY DAY. TAKE ADDTIONAL 1 TABLET ON DAYS YOU TAKE EXTRA LASIX 08/22/14   Lennette Bihari, MD  pravastatin (PRAVACHOL) 40 MG tablet Take 1 tablet (40 mg total) by mouth daily. 09/23/12   Herby Abraham, MD  predniSONE (DELTASONE) 5 MG tablet Take 5 mg by mouth daily with breakfast.    Historical Provider, MD  ranitidine (ZANTAC) 150 MG tablet Take 150 mg by mouth every morning.     Historical Provider, MD  ticagrelor (BRILINTA) 90 MG TABS tablet Take 1 tablet (90 mg total) by mouth 2 (two) times daily. 02/26/15   Lennette Bihari, MD  tiotropium (SPIRIVA) 18 MCG inhalation capsule Place 18 mcg into inhaler and inhale daily.    Historical Provider, MD   BP 137/54 mmHg  Pulse 79  Temp(Src) 98.3 F (36.8 C) (Oral)  Resp 22  Ht 5\' 3"  (1.6 m)  Wt 195 lb (88.451 kg)  BMI 34.55  kg/m2  SpO2 97% Physical Exam  Constitutional: She is oriented to person, place, and time. She appears well-developed and well-nourished. No distress.  HENT:  Head: Normocephalic and atraumatic.  Eyes: No scleral icterus. Right pupil is round and reactive. Left pupil is not round and not reactive. Pupils are unequal.  Slit lamp exam:      The left eye shows fluorescein uptake.  Neck: Neck supple.  Cardiovascular: Normal rate and intact distal pulses.   Pulmonary/Chest: Effort normal. No stridor. No respiratory distress.  Abdominal: Normal appearance. She exhibits no distension.  Neurological: She is alert and oriented to person, place, and time.  Skin: Skin is warm and dry. No rash noted.  Psychiatric: She has a normal mood and affect. Her behavior is normal.  Nursing note and vitals reviewed.   ED Course  Procedures (including critical care time) Labs Review Labs Reviewed  BASIC METABOLIC PANEL - Abnormal; Notable for the following:    CO2 19 (*)    Glucose, Bld 177 (*)    BUN 29 (*)    Creatinine, Ser 2.65 (*)    GFR calc non Af Amer 17 (*)    GFR calc Af Amer 19 (*)    All other components within normal limits  CBC - Abnormal; Notable for the following:    Hemoglobin 10.1 (*)    HCT 30.7 (*)    MCV 77.1 (*)    MCH 25.4 (*)    All other components within normal limits  TROPONIN I  COMPREHENSIVE METABOLIC PANEL  CBC  URINALYSIS, ROUTINE W REFLEX MICROSCOPIC (NOT AT [facility])  PHOSPHORUS  MAGNESIUM  NA AND K (SODIUM & POTASSIUM), RAND UR  PROTEIN / CREATININE RATIO, URINE  PROTEIN, URINE, RANDOM    Imaging Review No results found.   EKG Interpretation   Date/Time:  Sunday May 30 2015 17:04:23 EDT Ventricular  Rate:  80 PR Interval:  142 QRS Duration: 80 QT Interval:  392 QTC Calculation: 452 R Axis:   46 Text Interpretation:  Normal sinus rhythm ST \\T \ T wave abnormality,  consider inferior ischemia Abnormal ECG now with borderline ST/T  abnormality  Confirmed by [facility]  MD, TREY ([id number]) on 05/30/2015 6:42:19 PM      MDM   Final diagnoses:  Acute renal insufficiency  Left eye pain  Left corneal abrasion, initial encounter    Here with left eye pain. She has been evaluated by two eye doctors this week and her PCP.  She is on antibiotic drops and prednisone drops.  She has very limited vision in her left eye for the past several months and this symptom has persisted this week.  She has consensual photophobia and fluorescein uptake.  She got significant relief with tetracaine.  Plan continued drops and ophthalmology follow up.  She was found to have acute renal insufficiency and was admitted.      Blake Divine, MD 05/30/15 2312

## 2015-05-30 NOTE — H&P (Signed)
[facility] History and Physical  KATRINE RADICH ZOX:[id number] DOB: 30-Dec-1940 DOA: 05/30/2015  Referring physician: Blake Divine, M.D. PCP: [name], MD   Chief Complaint: Left eye pain  HPI: [name] Lane is a 74 y.o. female with below past medical history who comes to the ER due to very intense left eye pain since about 1600 earlier today. She states that her eye is teary, itchy and her pain radiates to her left periorbital and left cervical areas. She denies fever, chills,` but complains of fatigue, decreased appetite and oral intake.   Per patient, she had cataract surgery back in April, since then she has had difficulty recovering, including   postop complication of what she calls "bleeding behind her eyeball", with significant loss of vision on the eye. Since arlier this month has been having left eye pain, so her PCP and her eyes surgeon who had been treating her with drops and analgesics at home. She states that she was able to manage on  earlier today when the pain was very severe. She has improved significantly with analgesics and tetracaine drops, but workup done in the ED showed results consistent with acute kidney injury. We are admitting her to further evaluate this and to hydrate gently.   Review of Systems:  Constitutional:  No weight loss, night sweats, Fevers, chills, fatigue.  HEENT:  Left eye pain, conjunctival and scleral injection, photophobia. Left-sided headache Denies Difficulty swallowing,Tooth/dental problems,Sore throat,  No sneezing, itching, ear ache, nasal congestion, post nasal drip,  Cardio-vascular:  No chest pain, Orthopnea, PND, swelling in lower extremities, anasarca, dizziness, palpitations  GI:  No heartburn, indigestion, abdominal pain, nausea, vomiting, diarrhea, change in bowel habits,  Positive loss of appetite and decreased oral intake. Resp:  No shortness of breath with exertion or at rest. No excess mucus, no productive  cough, No non-productive cough, No coughing up of blood.No change in color of mucus.No wheezing.No chest wall deformity  Skin:  no rash or lesions.  GU:  Denies oliguria. no dysuria, change in color of urine, no urgency or frequency. No flank pain.  Musculoskeletal:  No joint pain or swelling. No decreased range of motion. No back pain.  Psych:  No change in mood or affect. No depression or anxiety. No memory loss.   Past Medical History  Diagnosis Date  . CAD (coronary artery disease)     a. 2003 BMS to RCA & LAD; PTCA of ISR 2003 b.12/2013 - staged DES PCI of the D1 and mRCA. c. 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  Marland Kitchen HLD (hyperlipidemia)   . Hypertension   . Type 2 diabetes mellitus treated with insulin   . COPD (chronic obstructive pulmonary disease)   . Unspecified asthma(493.90)   . Obstructive sleep apnea   . Edema   . GE reflux   . Carotid bruit   . Anxiety   . CKD (chronic kidney disease), stage III     a. Stage III-IV.  . Mild aortic stenosis     a. By cath 04/09/2014.  Marland Kitchen Retroperitoneal bleed     a. Hx of this on lovenox in 2001 (had been admitted for NSTEMI).   Past Surgical History  Procedure Laterality Date  . Appendectomy    . Cardiac catheterization  11/18/1999    Small non-STEMI; moderate 50-60% RCA lesion treated medically secondary to percutaneous RP bleed  . Percutaneous coronary stent intervention (pci-s)  02/17/2002  Two-vessel PCI: 80% mid LAD -- 2.25 mm x 8 mm Express 2 BMS; in RCA RCA 80% 3.5 mm x 12 mm Express 2 BMS  . Coronary angioplasty   04/29/2002    Cutting Balloon PTCA of LAD stent for ISR with 2.5 mm balloon  . Cardiac catheterization  April 2004, October 2010, December 2011    Stable CAD with mild ISR in LAD and RCA; treated medically  . Cardiac catheterization  01/04/2012    4 small non-STEMI; Myoview with lateral ischemia-- 95% proximal D1, 80% mid RCA post stent -- staged PCI   .  Percutaneous coronary stent intervention (pci-s)   03/07 & 06/2012    7th - Two-vessel PCI: 80% mid LAD -- 2.25 mm x 8 mm Express 2 BMS; in RCA RCA 80% 3.5 mm x 12 mm Express 2 BMS; 9th - mid RCA a Xience alpine DES 3.5 mm x 15 mm (postdilated 3.5 mm)   . Transthoracic echocardiogram   01/02/2014    (Most recent) moderate concentric LVH. Normal EF of 65-70%. Grade 1 diastolic dysfunction. Mild AI  . Nm myoview ltd  01/01/2014    EF 64%. The distal anterior/anterolateral ischemia -- referred for cath  . Left heart catheterization with coronary angiogram N/A 01/02/2014    Procedure: LEFT HEART CATHETERIZATION WITH CORONARY ANGIOGRAM;  Surgeon: Marykay Lex, MD;  Location: [facility] CATH LAB;  Service: Cardiovascular;  Laterality: N/A;  . Percutaneous coronary stent intervention (pci-s) N/A 01/06/2014    Procedure: PERCUTANEOUS CORONARY STENT INTERVENTION (PCI-S);  Surgeon: Marykay Lex, MD;  Location: [facility] CATH LAB;  Service: Cardiovascular;  Laterality: N/A;  . Left heart catheterization with coronary angiogram N/A 04/09/2014    Procedure: LEFT HEART CATHETERIZATION WITH CORONARY ANGIOGRAM;  Surgeon: Marykay Lex, MD;  Location: [facility] CATH LAB;  Service: Cardiovascular;  Laterality: N/A;   Social History:  reports that she quit smoking about 15 years ago. She has never used smokeless tobacco. She reports that she does not drink alcohol or use illicit drugs.  Allergies  Allergen Reactions  . Heparin Other (See Comments)    PATIENT ALMOST DIED FROM SIGNIFICANT BLEEDING EVENT, WAS BROUGHT TO [facility] ED AS A RESULT (2001)  . Morphine And Related Other (See Comments)    CAUSED SIGNIFICANT HYPOTENSION AND PATIENT CRASHED AND NEEDED INSTANT INTERVENTION  . Atorvastatin Other (See Comments)    REACTION: muscle aches  . Benzonatate Other (See Comments)    REACTION: confusion    Family History  Problem Relation Age of Onset  . Breast cancer Mother   . Heart disease       Prior to Admission medications     Medication Sig Start Date End Date Taking? Authorizing Provider  acetaminophen (TYLENOL) 325 MG tablet Take 325-650 mg by mouth every 6 (six) hours as needed for moderate pain.    Yes Historical Provider, MD  albuterol (PROVENTIL HFA;VENTOLIN HFA) 108 (90 BASE) MCG/ACT inhaler Inhale 2 puffs into the lungs every 6 (six) hours as needed for wheezing or shortness of breath.   Yes Historical Provider, MD  albuterol (PROVENTIL) (2.5 MG/3ML) 0.083% nebulizer solution Take 2.5 mg by nebulization every 6 (six) hours as needed for wheezing or shortness of breath.   Yes Historical Provider, MD  amLODipine (NORVASC) 5 MG tablet Take 5 mg by mouth daily.   Yes Historical Provider, MD  aspirin 81 MG tablet Take 81 mg by mouth daily.     Yes Historical Provider, MD  budesonide (PULMICORT) 0.5 MG/2ML  nebulizer solution Take 0.5 mg by nebulization 2 (two) times daily.   Yes Historical Provider, MD  cetirizine (ZYRTEC) 10 MG tablet Take 10 mg by mouth daily.     Yes Historical Provider, MD  DALIRESP 500 MCG TABS tablet Take 500 mg by mouth daily. 04/23/15  Yes Historical Provider, MD  FLUoxetine (PROZAC) 20 MG tablet Take 20 mg by mouth daily.    Yes Historical Provider, MD  furosemide (LASIX) 20 MG tablet Take 20-40 mg by mouth See admin instructions. TYPICALLY TAKES 1 TAB DAILY, BUT CAN TAKE ADDT'L TAB AS NEEDED FOR SHORTNESS OF BREATH, FLUID BUILDUP   Yes Historical Provider, MD  gentamicin (GARAMYCIN) 0.3 % ophthalmic ointment Place 1 application into the left eye 2 (two) times daily.   Yes Historical Provider, MD  insulin glargine (LANTUS) 100 UNIT/ML injection Inject 30 Units into the skin at bedtime.    Yes Historical Provider, MD  insulin lispro (HUMALOG) 100 UNIT/ML injection Inject 0-12 Units into the skin 3 (three) times daily with meals. If BG less than 150, no insulin given 151-200, takes 3 units 201-250, takes 5 units  251-300, takes 8 units 301-350, takes 10 units 351-400, takes 12 units 400+,  call MD   Yes Historical Provider, MD  isosorbide mononitrate (IMDUR) 30 MG 24 hr tablet Take 1 tablet (30 mg total) by mouth daily. NEEDS APPOINTMENT FOR FUTURE REFILLS 04/26/15  Yes Lennette Bihari, MD  LORazepam (ATIVAN) 1 MG tablet Take 1 mg by mouth 2 (two) times daily as needed for anxiety or sleep.    Yes Historical Provider, MD  nitroGLYCERIN (NITROSTAT) 0.4 MG SL tablet Place 1 tablet (0.4 mg total) under the tongue every 5 (five) minutes as needed for chest pain. 02/16/14  Yes Marykay Lex, MD  potassium chloride (K-DUR) 10 MEQ tablet TAKE 1 TABLET BY MOUTH EVERY DAY. TAKE ADDTIONAL 1 TABLET ON DAYS YOU TAKE EXTRA LASIX 08/22/14  Yes Lennette Bihari, MD  pravastatin (PRAVACHOL) 40 MG tablet Take 1 tablet (40 mg total) by mouth daily. Patient taking differently: Take 40 mg by mouth every evening.  09/23/12  Yes Herby Abraham, MD  predniSONE (DELTASONE) 5 MG tablet Take 5 mg by mouth daily with breakfast.   Yes Historical Provider, MD  ranitidine (ZANTAC) 150 MG tablet Take 150 mg by mouth 2 (two) times daily.    Yes Historical Provider, MD  ticagrelor (BRILINTA) 90 MG TABS tablet Take 1 tablet (90 mg total) by mouth 2 (two) times daily. 02/26/15  Yes Lennette Bihari, MD  tiotropium (SPIRIVA) 18 MCG inhalation capsule Place 18 mcg into inhaler and inhale daily.   Yes Historical Provider, MD  carvedilol (COREG) 12.5 MG tablet Take 1 tablet (12.5 mg total) by mouth 2 (two) times daily. 10/29/14   Lennette Bihari, MD  ezetimibe (ZETIA) 10 MG tablet Take 1 tablet (10 mg total) by mouth daily. 07/23/14   Lennette Bihari, MD  fenofibrate (TRICOR) 145 MG tablet Take 1 tablet (145 mg total) by mouth daily. 11/17/14   Lennette Bihari, MD  isosorbide mononitrate (IMDUR) 60 MG 24 hr tablet Take 1 tablet (60 mg total) by mouth daily. 07/23/14   Lennette Bihari, MD   Physical Exam: Filed Vitals:   05/30/15 1715 05/30/15 1915 05/30/15 2015 05/30/15 2057  BP: 137/54 154/58 152/56 152/56  Pulse: 79 70 69 69   Temp:    98.2 F (36.8 C)  TempSrc:    Oral  Resp:  Height:      Weight:      SpO2: 97% 97% 98% 98%    Wt Readings from Last 3 Encounters:  05/30/15 88.451 kg (195 lb)  07/23/14 86.637 kg (191 lb)  06/11/14 88.361 kg (194 lb 12.8 oz)    General:  Appears calm and comfortable Eyes: PERRL, normal lids, left eye conjunctival and scleral injection.  ENT: grossly normal hearing, lips & tongue Neck: no LAD, masses or thyromegaly Cardiovascular: RRR, no m/r/g. No LE edema. Telemetry: SR, no arrhythmias  Respiratory: CTA bilaterally, no w/r/r. Normal respiratory effort. Abdomen: soft, ntnd Skin: no rash or induration seen on limited exam Musculoskeletal: grossly normal tone BUE/BLE Psychiatric: grossly normal mood and affect, speech fluent and appropriate Neurologic: grossly non-focal.          Labs on Admission:  Basic Metabolic Panel:  Recent Labs Lab 05/30/15 1725  NA 137  K 4.1  CL 104  CO2 19*  GLUCOSE 177*  BUN 29*  CREATININE 2.65*  CALCIUM 9.0   Liver Function Tests: No results for input(s): AST, ALT, ALKPHOS, BILITOT, PROT, ALBUMIN in the last 168 hours. No results for input(s): LIPASE, AMYLASE in the last 168 hours. No results for input(s): AMMONIA in the last 168 hours. CBC:  Recent Labs Lab 05/30/15 1725  WBC 8.9  HGB 10.1*  HCT 30.7*  MCV 77.1*  PLT 333   Cardiac Enzymes:  Recent Labs Lab 05/30/15 1725  TROPONINI <0.03    EKG: Independently reviewed. Vent. rate 80 BPM PR interval 142 ms QRS duration 80 ms QT/QTc 392/452 ms P-R-T axes 62 46 113  Normal sinus rhythm ST & T wave abnormality, consider inferior ischemia Abnormal ECG  Assessment/Plan Principal Problem:   AKI (acute kidney injury) Active Problems:   Diabetes mellitus, type 2   HYPERCHOLESTEROLEMIA  IIA   Anxiety state   Essential hypertension   CAD - remote LAD and RCA stenting, last cath 12/11- 20% LAD, 40% RCA   COPD (chronic obstructive pulmonary  disease)   CKD (chronic kidney disease), stage III  Admit to MedSurg. Gentle hydration. Check ultrasound of kidneys, urine analysis, urine random electrolytes, urine random protein and urine creatinine and protein ratio. Continue analgesics and drops for her left eye.  Continue home meds. CBG monitoring with regular insulin coverage.  Code Status: Full code.  DVT Prophylaxis: SCDs. Family Communication:  Lillias, Difrancesco [phone]  [phone]   Dortha Schwalbe Daughter [phone]    Disposition Plan: Home with outpatient follow-up.  Time spent: Over 60 minutes.  Bobette Mo [facility] Pager [phone].

## 2015-05-30 NOTE — ED Notes (Signed)
Pt has been experiencing L eye pain and redness since last WEd.  She has been to [facility] and they have found minimal bleeding "behind eye".  Pt continues to feel L frontal/L eye pain, L neck pain and L arm pain.

## 2015-05-30 NOTE — ED Notes (Signed)
Pt requesting pain medication; Dr.Wofford made aware

## 2015-05-31 ENCOUNTER — Observation Stay (HOSPITAL_COMMUNITY): Payer: Medicare Other

## 2015-05-31 DIAGNOSIS — N179 Acute kidney failure, unspecified: Secondary | ICD-10-CM

## 2015-05-31 DIAGNOSIS — N183 Chronic kidney disease, stage 3 (moderate): Secondary | ICD-10-CM

## 2015-05-31 DIAGNOSIS — I2510 Atherosclerotic heart disease of native coronary artery without angina pectoris: Secondary | ICD-10-CM | POA: Diagnosis not present

## 2015-05-31 LAB — URINALYSIS, ROUTINE W REFLEX MICROSCOPIC
Bilirubin Urine: NEGATIVE
Glucose, UA: NEGATIVE mg/dL
HGB URINE DIPSTICK: NEGATIVE
KETONES UR: NEGATIVE mg/dL
NITRITE: POSITIVE — AB
Protein, ur: NEGATIVE mg/dL
SPECIFIC GRAVITY, URINE: 1.012 (ref 1.005–1.030)
UROBILINOGEN UA: 0.2 mg/dL (ref 0.0–1.0)
pH: 5.5 (ref 5.0–8.0)

## 2015-05-31 LAB — COMPREHENSIVE METABOLIC PANEL
ALK PHOS: 31 U/L — AB (ref 38–126)
ALT: 15 U/L (ref 14–54)
ANION GAP: 5 (ref 5–15)
AST: 21 U/L (ref 15–41)
Albumin: 3.4 g/dL — ABNORMAL LOW (ref 3.5–5.0)
BILIRUBIN TOTAL: 0.6 mg/dL (ref 0.3–1.2)
BUN: 24 mg/dL — AB (ref 6–20)
CALCIUM: 8.6 mg/dL — AB (ref 8.9–10.3)
CHLORIDE: 109 mmol/L (ref 101–111)
CO2: 23 mmol/L (ref 22–32)
Creatinine, Ser: 2.06 mg/dL — ABNORMAL HIGH (ref 0.44–1.00)
GFR calc Af Amer: 26 mL/min — ABNORMAL LOW (ref >60)
GFR calc non Af Amer: 22 mL/min — ABNORMAL LOW (ref >60)
GLUCOSE: 187 mg/dL — AB (ref 65–99)
POTASSIUM: 3.6 mmol/L (ref 3.5–5.1)
Sodium: 137 mmol/L (ref 135–145)
TOTAL PROTEIN: 6 g/dL — AB (ref 6.5–8.1)

## 2015-05-31 LAB — CBC
HCT: 29.1 % — ABNORMAL LOW (ref 36.0–46.0)
HEMOGLOBIN: 9.3 g/dL — AB (ref 12.0–15.0)
MCH: 25.1 pg — ABNORMAL LOW (ref 26.0–34.0)
MCHC: 32 g/dL (ref 30.0–36.0)
MCV: 78.4 fL (ref 78.0–100.0)
Platelets: 292 10*3/uL (ref 150–400)
RBC: 3.71 MIL/uL — ABNORMAL LOW (ref 3.87–5.11)
RDW: 15.7 % — ABNORMAL HIGH (ref 11.5–15.5)
WBC: 7.7 10*3/uL (ref 4.0–10.5)

## 2015-05-31 LAB — GLUCOSE, CAPILLARY
GLUCOSE-CAPILLARY: 163 mg/dL — AB (ref 65–99)
GLUCOSE-CAPILLARY: 108 mg/dL — AB (ref 65–99)
Glucose-Capillary: 183 mg/dL — ABNORMAL HIGH (ref 65–99)
Glucose-Capillary: 180 mg/dL — ABNORMAL HIGH (ref 65–99)
Glucose-Capillary: 122 mg/dL — ABNORMAL HIGH (ref 65–99)

## 2015-05-31 LAB — URINE MICROSCOPIC-ADD ON

## 2015-05-31 LAB — PHOSPHORUS: PHOSPHORUS: 3.5 mg/dL (ref 2.5–4.6)

## 2015-05-31 LAB — MAGNESIUM: MAGNESIUM: 2 mg/dL (ref 1.7–2.4)

## 2015-05-31 MED ORDER — PROMETHAZINE HCL 25 MG/ML IJ SOLN
12.5000 mg | Freq: Four times a day (QID) | INTRAMUSCULAR | Status: DC | PRN
  Administered 2015-05-31: 12.5 mg via INTRAVENOUS
  Filled 2015-05-31: qty 1

## 2015-05-31 MED ORDER — CARVEDILOL 12.5 MG PO TABS
12.5000 mg | ORAL_TABLET | Freq: Two times a day (BID) | ORAL | Status: DC
  Administered 2015-05-31 – 2015-06-01 (×2): 12.5 mg via ORAL
  Filled 2015-05-31 (×2): qty 1

## 2015-05-31 MED ORDER — CEFTRIAXONE SODIUM IN DEXTROSE 20 MG/ML IV SOLN
1.0000 g | INTRAVENOUS | Status: DC
  Administered 2015-05-31 – 2015-06-01 (×2): 1 g via INTRAVENOUS
  Filled 2015-05-31 (×2): qty 50

## 2015-05-31 MED ORDER — SODIUM CHLORIDE 0.9 % IV SOLN
INTRAVENOUS | Status: DC
  Administered 2015-05-31: 15:00:00 via INTRAVENOUS

## 2015-05-31 NOTE — Progress Notes (Signed)
ANTIBIOTIC CONSULT NOTE - INITIAL  Pharmacy Consult for Rocephin Indication: UTI  Allergies  Allergen Reactions  . Heparin Other (See Comments)    PATIENT ALMOST DIED FROM SIGNIFICANT BLEEDING EVENT, WAS BROUGHT TO [facility] ED AS A RESULT (2001)  . Morphine And Related Other (See Comments)    CAUSED SIGNIFICANT HYPOTENSION AND PATIENT CRASHED AND NEEDED INSTANT INTERVENTION  . Atorvastatin Other (See Comments)    REACTION: muscle aches  . Benzonatate Other (See Comments)    REACTION: confusion    Labs:  Recent Labs  05/30/15 1725  WBC 8.9  HGB 10.1*  PLT 333  CREATININE 2.65*   Estimated Creatinine Clearance: 19.3 mL/min (by C-G formula based on Cr of 2.65).    74yo female admitted overnight for eye pain s/p cataract surgery, found w/ AKI, now w/ abnormal UA, to begin IV ABX.  Will start Rocephin 1g IV Q24H and monitor CBC and Cx.  Vernard Gambles, PharmD, BCPS  05/31/2015,4:51 AM

## 2015-05-31 NOTE — Progress Notes (Signed)
[facility] PROGRESS NOTE  [name] Lane YQM:[id number] DOB: 1941/01/15 DOA: 05/30/2015 PCP: [name], MD  Assessment/Plan: 1. AKi on CKD -baseline creatinine around 1.5 -improving with hydration -continue IVF, cut down rate  2. 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 -IOP was WNL when seen on Friday  3. CAD with PCIs and stents -last was staged DES PCI of diagonal vessel and mid RCA. -continue brilinta, statin, imdur  4. DM -stable, lantus, SSI  5. Possible UTI -continue ceftriaxone, FU urine Cx  6. COPD/Asthma -continue Prednisone, symbicort, spiriva  DVT proph: SCDs  Code Status: Full COde Family Communication: none at bedside Disposition Plan: Home tomorrow if stable   Consultants:  Ophthalmology  HPI/Subjective: Still having L eye pain  Objective: Filed Vitals:   05/31/15 1008  BP: 171/63  Pulse:   Temp:   Resp:     Intake/Output Summary (Last 24 hours) at 05/31/15 1009 Last data filed at 05/31/15 0400  Gross per 24 hour  Intake 810.83 ml  Output      0 ml  Net 810.83 ml   Filed Weights   05/30/15 1705 05/30/15 2244  Weight: 88.451 kg (195 lb) 88.4 kg (194 lb 14.2 oz)    Exam:   General:  AAOx3, uncomfortable appearing  HEENT: Pupils on L not reactive, erythema of sclera, photophobia  Cardiovascular: S1S2/RRR  Respiratory: CTAB  Abdomen: soft, NT, BS  present  Musculoskeletal: no edema c/c  Data Reviewed: Basic Metabolic Panel:  Recent Labs Lab 05/30/15 1725 05/31/15 0326  NA 137 137  K 4.1 3.6  CL 104 109  CO2 19* 23  GLUCOSE 177* 187*  BUN 29* 24*  CREATININE 2.65* 2.06*  CALCIUM 9.0 8.6*  MG  --  2.0  PHOS  --  3.5   Liver Function Tests:  Recent Labs Lab 05/31/15 0326  AST 21  ALT 15  ALKPHOS 31*  BILITOT 0.6  PROT 6.0*  ALBUMIN 3.4*   No results for input(s): LIPASE, AMYLASE in the last 168 hours. No results for input(s): AMMONIA in the last 168 hours. CBC:  Recent Labs Lab 05/30/15 1725 05/31/15 0326  WBC 8.9 7.7  HGB 10.1* 9.3*  HCT 30.7* 29.1*  MCV 77.1* 78.4  PLT 333 292   Cardiac Enzymes:  Recent Labs Lab 05/30/15 1725  TROPONINI <0.03   BNP (last 3 results) No results for input(s): BNP in the last 8760 hours.  ProBNP (last 3 results) No results for input(s): PROBNP in the last 8760 hours.  CBG:  Recent Labs Lab 05/31/15 0307 05/31/15 0757  GLUCAP 183* 163*    No results found for this or any previous visit (from the past 240 hour(s)).   Studies: US Renal  05/31/2015   CLINICAL DATA:  Acute kidney injury.  EXAM: RENAL / URINARY TRACT ULTRASOUND COMPLETE  COMPARISON:  Renal ultrasound 03/29/2011  FINDINGS: Right Kidney:  Length: 9.1 cm, previously 12 cm. There is thinning  of the renal parenchyma with increased echogenicity and mild cortical scarring. No mass or hydronephrosis visualized.  Left Kidney:  Length: 9.8 cm, previously 10.8 cm. There is thinning of the renal parenchyma and increased echogenicity. Lobular renal contours. No mass or hydronephrosis visualized.  Bladder:  Decompressed and not evaluated.  IMPRESSION: Thinning of the renal parenchyma and increased echogenicity consistent with chronic medical renal disease. No hydronephrosis. Mild scarring of the right renal parenchyma.   Electronically Signed   By: Rubye Oaks M.D.   On: 05/31/2015 01:09    Scheduled  Meds: . amLODipine  5 mg Oral Daily  . aspirin EC  81 mg Oral Daily  . budesonide  0.5 mg Nebulization BID  . cefTRIAXone (ROCEPHIN)  IV  1 g Intravenous Q24H  . famotidine  20 mg Oral BID  . FLUoxetine  20 mg Oral Daily  . insulin aspart  0-9 Units Subcutaneous TID WC  . insulin glargine  30 Units Subcutaneous QHS  . isosorbide mononitrate  30 mg Oral Daily  . loratadine  10 mg Oral Daily  . potassium chloride  10 mEq Oral Daily  . pravastatin  40 mg Oral QPM  . predniSONE  5 mg Oral Q breakfast  . roflumilast  500 mcg Oral Daily  . ticagrelor  90 mg Oral BID  . tiotropium  18 mcg Inhalation Daily  . tobramycin   Left Eye QID   Continuous Infusions: . sodium chloride 1 mL (05/31/15 1324)   Antibiotics Given (last 72 hours)    Date/Time Action Medication Dose Rate   05/31/15 0524 Given   cefTRIAXone (ROCEPHIN) 1 g in dextrose 5 % 50 mL IVPB - Premix 1 g 100 mL/hr      Principal Problem:   AKI (acute kidney injury) Active Problems:   Diabetes mellitus, type 2   HYPERCHOLESTEROLEMIA  IIA   Anxiety state   Essential hypertension   CAD - remote LAD and RCA stenting, last cath 12/11- 20% LAD, 40% RCA   COPD (chronic obstructive pulmonary disease)   CKD (chronic kidney disease), stage III    Time spent:    [facility]  [facility] Pager [phone]. If 7PM-7AM, please contact night-coverage at [email or web address], password [facility] 05/31/2015, 10:09 AM

## 2015-05-31 NOTE — Telephone Encounter (Signed)
REFILL 

## 2015-05-31 NOTE — Progress Notes (Signed)
Pt urinalysis collected with positive nitrite informed on call MD  Waiting for his response.

## 2015-06-01 DIAGNOSIS — N179 Acute kidney failure, unspecified: Secondary | ICD-10-CM | POA: Diagnosis not present

## 2015-06-01 DIAGNOSIS — J449 Chronic obstructive pulmonary disease, unspecified: Secondary | ICD-10-CM

## 2015-06-01 LAB — CBC
HCT: 30.5 % — ABNORMAL LOW (ref 36.0–46.0)
HEMOGLOBIN: 9.5 g/dL — AB (ref 12.0–15.0)
MCH: 24.4 pg — ABNORMAL LOW (ref 26.0–34.0)
MCHC: 31.1 g/dL (ref 30.0–36.0)
MCV: 78.4 fL (ref 78.0–100.0)
Platelets: 313 10*3/uL (ref 150–400)
RBC: 3.89 MIL/uL (ref 3.87–5.11)
RDW: 15.7 % — ABNORMAL HIGH (ref 11.5–15.5)
WBC: 8.6 10*3/uL (ref 4.0–10.5)

## 2015-06-01 LAB — BASIC METABOLIC PANEL
ANION GAP: 8 (ref 5–15)
BUN: 14 mg/dL (ref 6–20)
CO2: 21 mmol/L — AB (ref 22–32)
Calcium: 9 mg/dL (ref 8.9–10.3)
Chloride: 110 mmol/L (ref 101–111)
Creatinine, Ser: 1.62 mg/dL — ABNORMAL HIGH (ref 0.44–1.00)
GFR calc Af Amer: 35 mL/min — ABNORMAL LOW (ref >60)
GFR calc non Af Amer: 30 mL/min — ABNORMAL LOW (ref >60)
Glucose, Bld: 128 mg/dL — ABNORMAL HIGH (ref 65–99)
Potassium: 3.7 mmol/L (ref 3.5–5.1)
SODIUM: 139 mmol/L (ref 135–145)

## 2015-06-01 LAB — GLUCOSE, CAPILLARY: Glucose-Capillary: 124 mg/dL — ABNORMAL HIGH (ref 65–99)

## 2015-06-01 MED ORDER — CIPROFLOXACIN HCL 250 MG PO TABS: 250.0000 mg | ORAL_TABLET | Freq: Two times a day (BID) | ORAL | Status: DC

## 2015-06-01 MED ORDER — TETRACAINE HCL 0.5 % OP SOLN: 2.0000 [drp] | Freq: Four times a day (QID) | OPHTHALMIC | Status: AC | PRN

## 2015-06-01 NOTE — Progress Notes (Signed)
[name] Lane to be D/C'd Home per MD order.  Discussed with the patient and all questions fully answered.  VSS, Skin clean, dry and intact without evidence of skin break down, no evidence of skin tears noted. IV catheter discontinued intact. Site without signs and symptoms of complications. Dressing and pressure applied.  An After Visit Summary was printed and given to the patient. Patient received prescriptions.  D/c education completed with patient/family including follow up instructions, medication list, d/c activities limitations if indicated, with other d/c instructions as indicated by MD - patient able to verbalize understanding, all questions fully answered.   Patient instructed to return to ED, call 911, or call MD for any changes in condition.   Patient escorted via WC, and D/C home via private auto.  [name] D 06/01/2015 11:01 AM

## 2015-06-01 NOTE — Discharge Summary (Signed)
Physician Discharge Summary  [name] Lane ZOX:[id number] DOB: 1941-04-20 DOA: 05/30/2015  PCP: [name], MD  Admit date: 05/30/2015 Discharge date: 06/01/2015  Time spent: 45 minutes  Recommendations for Outpatient Follow-up:  1. PCP in 1 week 2. [name]/Ophthalmology at [facility], in [place]  Discharge Diagnoses:  Principal Problem:   AKI (acute kidney injury)   L eye pain   Diabetes mellitus, type 2   HYPERCHOLESTEROLEMIA  IIA   Anxiety state   Essential hypertension   CAD - remote LAD and RCA stenting, last cath 12/11- 20% LAD, 40% RCA   COPD (chronic obstructive pulmonary disease)   CKD (chronic kidney disease), stage III   Discharge Condition:stable  Diet recommendation: diabetic, low sodium  Filed Weights   05/30/15 1705 05/30/15 2244  Weight: 88.451 kg (195 lb) 88.4 kg (194 lb 14.2 oz)    History of present illness:  Chief Complaint: Left eye pain HPI: [name] Lane is a 74 y.o. female with DM and extensive cardiac history presented to the ER due to very intense left eye pain, ongoing for few days but worsened yesterday. She stated that her eye is teary, itchy and her pain radiates to her left periorbital. Per patient, she had cataract surgery back in April, since then she has had difficulty recovering, including postop complication of what she calls "bleeding behind her eyeball",CRVO with loss of vision in L eye. Since earlier this month has been having left eye pain, so her PCP and her eyes doctors at [facility] [place] have been treating her with drops and analgesics at home. She states that she was able to manage on earlier 7/31 when the pain was very severe. She is improving with analgesics and tetracaine drops ordered in the ED. Lab workup done in the ED showed results consistent with acute kidney injury. We admitted her to further evaluate.   Hospital Course:  1. AKi on CKD -due to dehydration, diuretics, baseline creatinine around  1.5 -improved with hydration -creatinine 1.6 at discharge  2. 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 -She is improved symptomatically now, her MRI brain and Orbit are unremarkable -She will be discharged to FU with her Ophthalmologists tomorrow  3. CAD with PCIs and stents -last was staged DES PCI of diagonal vessel and mid RCA in 3/15 -continue brilinta, statin, imdur  4. DM -stable, continue lantus, Sliding scale Insulin  5. Possible UTI -treated with ceftriaxone, Urine cultures still pending, transitioned to Oral ciprofloxacin at discharge  6. COPD/Asthma -stable -continue Prednisone, symbicort, spiriva  Consultations: Ophthalm: d/w [name]  Discharge Exam: Filed Vitals:   06/01/15 0553  BP: 161/72  Pulse: 88  Temp: 98.6 F (37 C)  Resp: 17    General: AAOxx3 HEENT: L pupil not reactive, sclera injected Cardiovascular: S1S/RRR Respiratory: CTAB  Discharge Instructions   Discharge Instructions  Diet - low sodium heart healthy    Complete by:  As directed      Diet Carb Modified    Complete by:  As directed      Increase activity slowly    Complete by:  As directed           Current Discharge Medication List    START taking these medications   Details  tetracaine (PONTOCAINE) 0.5 % ophthalmic solution Place 2 drops into the left eye every 6  (six) hours as needed (Pain). Qty: 2 mL, Refills: 0      CONTINUE these medications which have NOT CHANGED   Details  acetaminophen (TYLENOL) 325 MG tablet Take 325-650 mg by mouth every 6 (six) hours as needed for moderate pain.     albuterol (PROVENTIL HFA;VENTOLIN HFA) 108 (90 BASE) MCG/ACT inhaler Inhale 2 puffs into the lungs every 6 (six) hours as needed for wheezing or shortness of breath.    albuterol (PROVENTIL) (2.5 MG/3ML) 0.083% nebulizer solution Take 2.5 mg by nebulization every 6 (six) hours as needed for wheezing or shortness of breath.    amLODipine (NORVASC) 5 MG tablet Take 5 mg by mouth daily.    aspirin 81 MG tablet Take 81 mg by mouth daily.      budesonide (PULMICORT) 0.5 MG/2ML nebulizer solution Take 0.5 mg by nebulization 2 (two) times daily.    cetirizine (ZYRTEC) 10 MG tablet Take 10 mg by mouth daily.      DALIRESP 500 MCG TABS tablet Take 500 mg by mouth daily. Refills: 5    FLUoxetine (PROZAC) 20 MG tablet Take 20 mg by mouth daily.     furosemide (LASIX) 20 MG tablet Take 20-40 mg by mouth See admin instructions. TYPICALLY TAKES 1 TAB DAILY, BUT CAN TAKE ADDT'L TAB AS NEEDED FOR SHORTNESS OF BREATH, FLUID BUILDUP    gentamicin (GARAMYCIN) 0.3 % ophthalmic ointment Place 1 application into the left eye 2 (two) times daily.    insulin glargine (LANTUS) 100 UNIT/ML injection Inject 30 Units into the skin at bedtime.     insulin lispro (HUMALOG) 100 UNIT/ML injection Inject 0-12 Units into the skin 3 (three) times daily with meals. If BG less than 150, no insulin given 151-200, takes 3 units 201-250, takes 5 units  251-300, takes 8 units 301-350, takes 10 units 351-400, takes 12 units 400+, call MD    isosorbide mononitrate (IMDUR) 30 MG 24 hr tablet Take 1 tablet (30 mg total) by mouth daily. NEEDS APPOINTMENT FOR FUTURE REFILLS Qty: 30 tablet, Refills: 3    LORazepam (ATIVAN) 1 MG tablet Take 1 mg by mouth 2 (two) times daily as needed for  anxiety or sleep.     nitroGLYCERIN (NITROSTAT) 0.4 MG SL tablet Place 1 tablet (0.4 mg total) under the tongue every 5 (five) minutes as needed for chest pain. Qty: 25 tablet, Refills: 6   Associated Diagnoses: S/P angioplasty with stent    potassium chloride (K-DUR) 10 MEQ tablet TAKE 1 TABLET BY MOUTH EVERY DAY. TAKE ADDTIONAL 1 TABLET ON DAYS YOU TAKE EXTRA LASIX Qty: 30 tablet, Refills: 11    pravastatin (PRAVACHOL) 40 MG tablet Take 1 tablet (40 mg total) by mouth daily. Qty: 30 tablet, Refills: 1    predniSONE (DELTASONE) 5 MG tablet Take 5 mg by mouth daily with breakfast.    ranitidine (ZANTAC) 150 MG tablet Take 150 mg by mouth 2 (two) times daily.     ticagrelor (BRILINTA) 90 MG TABS tablet  Take 1 tablet (90 mg total) by mouth 2 (two) times daily. Qty: 60 tablet, Refills: 4    tiotropium (SPIRIVA) 18 MCG inhalation capsule Place 18 mcg into inhaler and inhale daily.    carvedilol (COREG) 12.5 MG tablet Take 1 tablet (12.5 mg total) by mouth 2 (two) times daily. NEED OV. Qty: 60 tablet, Refills: 0    ezetimibe (ZETIA) 10 MG tablet Take 1 tablet (10 mg total) by mouth daily. Qty: 30 tablet, Refills: 6    fenofibrate (TRICOR) 145 MG tablet Take 1 tablet (145 mg total) by mouth daily. Qty: 30 tablet, Refills: 6       Allergies  Allergen Reactions  . Heparin Other (See Comments)    PATIENT ALMOST DIED FROM SIGNIFICANT BLEEDING EVENT, WAS BROUGHT TO [facility] ED AS A RESULT (2001)  . Morphine And Related Other (See Comments)    CAUSED SIGNIFICANT HYPOTENSION AND PATIENT CRASHED AND NEEDED INSTANT INTERVENTION  . Atorvastatin Other (See Comments)    REACTION: muscle aches  . Benzonatate Other (See Comments)    REACTION: confusion   Follow-up Information    Follow up with [name], MD. Schedule an appointment as soon as possible for a visit in 1 week.   Specialty:  Family Medicine   Contact information:   [address] [place] [address] [phone]        Follow up with [name] On 06/02/2015.   Why:  at [facility] in [place]       The results of significant diagnostics from this hospitalization (including imaging, microbiology, ancillary and laboratory) are listed below for reference.    Significant Diagnostic Studies: Mr Brain Wo Contrast  05/31/2015   CLINICAL DATA:  Severe LEFT eye pain beginning at 1600 hours yesterday, associated with fatigue, decreased appetite. Cataract surgery in April with difficult recovery.  EXAM: MRI HEAD AND ORBITS WITHOUT CONTRAST  TECHNIQUE: Multiplanar, multiecho pulse sequences of the brain and surrounding structures were obtained without intravenous contrast. Multiplanar, multiecho pulse sequences of the orbits and surrounding structures were obtained including fat saturation techniques, without intravenous contrast administration.  COMPARISON:  None available for comparison at time of study interpretation.  FINDINGS: MRI HEAD FINDINGS  The ventricles and sulci are normal for patient's age. No suspicious parenchymal signal, mass lesions, mass effect. Scattered subcentimeter supratentorial white matter T2 hyperintensities, within normal range for patient's age. No reduced diffusion to suggest acute ischemia. No susceptibility artifact to suggest hemorrhage.  No abnormal extra-axial fluid collections. No extra-axial masses though, contrast enhanced sequences would be more sensitive. Dolicoectatic major intracranial vascular flow voids seen at the skull base.  Ocular globes and orbital contents are unremarkable though not tailored for evaluation. No abnormal sellar expansion. Visualized paranasal sinuses and mastoid air cells are well-aerated. No suspicious calvarial bone marrow signal. No abnormal sellar expansion. Craniocervical junction maintained.  MRI ORBITS FINDINGS  Ocular globes intact, status post bilateral ocular lens implants. Homogeneous signal of the globes. Normal appearance optic nerves and nerve sheath  complexes. Preservation of the orbital fat. Normal appearance of the extraocular muscles. Superior ophthalmic veins are not enlarged.  IMPRESSION: MRI HEAD: No acute intracranial process, no specific findings to explain LEFT eye pain.  Involutional changes. Mild white matter changes compatible chronic small vessel ischemic disease.  MRI ORBITS: Status post bilateral ocular lens implant. No specific findings to explain LEFT eye pain by noncontrast imaging.   Electronically Signed   By: Awilda Metro M.D.   On: 05/31/2015 23:05  US Renal  05/31/2015   CLINICAL DATA:  Acute kidney injury.  EXAM: RENAL / URINARY TRACT ULTRASOUND COMPLETE  COMPARISON:  Renal ultrasound 03/29/2011  FINDINGS: Right Kidney:  Length: 9.1 cm, previously 12 cm. There is thinning of the renal parenchyma with increased echogenicity and mild cortical scarring. No mass or hydronephrosis visualized.  Left Kidney:  Length: 9.8 cm, previously 10.8 cm. There is thinning of the renal parenchyma and increased echogenicity. Lobular renal contours. No mass or hydronephrosis visualized.  Bladder:  Decompressed and not evaluated.  IMPRESSION: Thinning of the renal parenchyma and increased echogenicity consistent with chronic medical renal disease. No hydronephrosis. Mild scarring of the right renal parenchyma.   Electronically Signed   By: Rubye Oaks M.D.   On: 05/31/2015 01:09   Mr Dessa Phi Cm  05/31/2015   CLINICAL DATA:  Severe LEFT eye pain beginning at 1600 hours yesterday, associated with fatigue, decreased appetite. Cataract surgery in April with difficult recovery.  EXAM: MRI HEAD AND ORBITS WITHOUT CONTRAST  TECHNIQUE: Multiplanar, multiecho pulse sequences of the brain and surrounding structures were obtained without intravenous contrast. Multiplanar, multiecho pulse sequences of the orbits and surrounding structures were obtained including fat saturation techniques, without intravenous contrast administration.  COMPARISON:  None  available for comparison at time of study interpretation.  FINDINGS: MRI HEAD FINDINGS  The ventricles and sulci are normal for patient's age. No suspicious parenchymal signal, mass lesions, mass effect. Scattered subcentimeter supratentorial white matter T2 hyperintensities, within normal range for patient's age. No reduced diffusion to suggest acute ischemia. No susceptibility artifact to suggest hemorrhage.  No abnormal extra-axial fluid collections. No extra-axial masses though, contrast enhanced sequences would be more sensitive. Dolicoectatic major intracranial vascular flow voids seen at the skull base.  Ocular globes and orbital contents are unremarkable though not tailored for evaluation. No abnormal sellar expansion. Visualized paranasal sinuses and mastoid air cells are well-aerated. No suspicious calvarial bone marrow signal. No abnormal sellar expansion. Craniocervical junction maintained.  MRI ORBITS FINDINGS  Ocular globes intact, status post bilateral ocular lens implants. Homogeneous signal of the globes. Normal appearance optic nerves and nerve sheath complexes. Preservation of the orbital fat. Normal appearance of the extraocular muscles. Superior ophthalmic veins are not enlarged.  IMPRESSION: MRI HEAD: No acute intracranial process, no specific findings to explain LEFT eye pain.  Involutional changes. Mild white matter changes compatible chronic small vessel ischemic disease.  MRI ORBITS: Status post bilateral ocular lens implant. No specific findings to explain LEFT eye pain by noncontrast imaging.   Electronically Signed   By: Awilda Metro M.D.   On: 05/31/2015 23:05    Microbiology: No results found for this or any previous visit (from the past 240 hour(s)).   Labs: Basic Metabolic Panel:  Recent Labs Lab 05/30/15 1725 05/31/15 0326 06/01/15 0342  NA 137 137 139  K 4.1 3.6 3.7  CL 104 109 110  CO2 19* 23 21*  GLUCOSE 177* 187* 128*  BUN 29* 24* 14  CREATININE 2.65*  2.06* 1.62*  CALCIUM 9.0 8.6* 9.0  MG  --  2.0  --   PHOS  --  3.5  --    Liver Function Tests:  Recent Labs Lab 05/31/15 0326  AST 21  ALT 15  ALKPHOS 31*  BILITOT 0.6  PROT 6.0*  ALBUMIN 3.4*   No results for input(s): LIPASE, AMYLASE in the last 168 hours. No results for input(s): AMMONIA in the last 168 hours. CBC:  Recent Labs Lab 05/30/15  1725 05/31/15 0326 06/01/15 0342  WBC 8.9 7.7 8.6  HGB 10.1* 9.3* 9.5*  HCT 30.7* 29.1* 30.5*  MCV 77.1* 78.4 78.4  PLT 333 292 313   Cardiac Enzymes:  Recent Labs Lab 05/30/15 1725  TROPONINI <0.03   BNP: BNP (last 3 results) No results for input(s): BNP in the last 8760 hours.  ProBNP (last 3 results) No results for input(s): PROBNP in the last 8760 hours.  CBG:  Recent Labs Lab 05/31/15 0757 05/31/15 1232 05/31/15 1724 05/31/15 2210 06/01/15 0746  GLUCAP 163* 180* 108* 122* 124*       Signed:  Romar Woodrick  [facility] 06/01/2015, 9:54 AM

## 2015-06-02 LAB — URINE CULTURE: Culture: >=100000

## 2015-06-04 ENCOUNTER — Ambulatory Visit: Payer: Medicare Other

## 2015-06-07 ENCOUNTER — Ambulatory Visit (INDEPENDENT_AMBULATORY_CARE_PROVIDER_SITE_OTHER): Payer: Medicare Other

## 2015-06-07 DIAGNOSIS — J4540 Moderate persistent asthma, uncomplicated: Secondary | ICD-10-CM | POA: Diagnosis not present

## 2015-06-08 ENCOUNTER — Telehealth: Payer: Self-pay | Admitting: Pulmonary Disease

## 2015-06-08 MED ORDER — OMALIZUMAB 150 MG ~~LOC~~ SOLR
375.0000 mg | Freq: Once | SUBCUTANEOUS | Status: AC
  Administered 2015-06-07: 375 mg via SUBCUTANEOUS

## 2015-06-08 NOTE — Telephone Encounter (Signed)
#   vials:6 Ordered date:06/08/15 Shipping Date:06/09/15 

## 2015-06-09 NOTE — Telephone Encounter (Signed)
#   Vials:6 Arrival Date:06/09/15 Lot #:[id number] Exp Date:2/20  

## 2015-06-09 NOTE — Telephone Encounter (Signed)
2 different lot #s. The correct one: [id number] 

## 2015-06-25 ENCOUNTER — Ambulatory Visit (INDEPENDENT_AMBULATORY_CARE_PROVIDER_SITE_OTHER): Payer: Medicare Other

## 2015-06-25 DIAGNOSIS — J4540 Moderate persistent asthma, uncomplicated: Secondary | ICD-10-CM

## 2015-06-28 ENCOUNTER — Ambulatory Visit (INDEPENDENT_AMBULATORY_CARE_PROVIDER_SITE_OTHER): Payer: Medicare Other | Admitting: Pulmonary Disease

## 2015-06-28 ENCOUNTER — Encounter: Payer: Self-pay | Admitting: Pulmonary Disease

## 2015-06-28 VITALS — BP 110/64 | HR 89 | Ht 63.0 in | Wt 188.2 lb

## 2015-06-28 DIAGNOSIS — Z9989 Dependence on other enabling machines and devices: Secondary | ICD-10-CM

## 2015-06-28 DIAGNOSIS — I10 Essential (primary) hypertension: Secondary | ICD-10-CM | POA: Diagnosis not present

## 2015-06-28 DIAGNOSIS — G4733 Obstructive sleep apnea (adult) (pediatric): Secondary | ICD-10-CM | POA: Diagnosis not present

## 2015-06-28 DIAGNOSIS — J45901 Unspecified asthma with (acute) exacerbation: Secondary | ICD-10-CM | POA: Diagnosis not present

## 2015-06-28 NOTE — Patient Instructions (Signed)
  STOP taking daliresp Decrease [name] to every 4 weeks Call as needed Your dizziness may be related to side effect of medication - careful when you stand up from a sitting /lying down position

## 2015-06-28 NOTE — Assessment & Plan Note (Signed)
Your dizziness may be related to orthostatic hypotension/ side effect of medication - careful when you stand up from a sitting /lying down position

## 2015-06-28 NOTE — Assessment & Plan Note (Addendum)
STOP taking daliresp Decrease [name] to every 4 weeks If tolerated, will try to taper prednisone next visit Call as needed

## 2015-06-28 NOTE — Progress Notes (Signed)
   Subjective:    Patient ID: [name] Lane, female    DOB: 10/11/1941, 74 y.o.   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 Triggers being activity, perfumes, spring allergies (claritin helps) , cold weather etc.  Son (EMT) reported severe anxiety, now on ativan  ON spiriva since june 2011with improved exercise tolerance  She has done remarkably well on xolair &  prednisone since jan 2014 - reduced hospitalisations   Has a dog & bird- cockatoo, clean cage.    06/28/2015  Chief Complaint  Patient presents with  . Follow-up    Patient doing well.  no concerns.   Accompanied by son,  Currently on pulmicort neb Twice daily , spiriva daily and [name]. Compliant with meds per pt.  Remains on daily prednisone  daily  & daliresp    No flare of cough, dyspnea or wheezing.  No increased SABA use. Rare use.  No hemoptysis , orthopnea, increased leg swelling or fever.    Compliant with  CPAP At bedtime  On avg 6 hr each night.  No issues with mask or machine.  Feels rested in am.  No increased daytime sleepiness.   C/o dizziness when she stands up suddenly  Significant tests/ events Spirometry >> FEv1 48 % c/w severe airway obstruction.  RAST - IGE 636, +house dust, mildly pos for cat & dog dander  PSG -Moderate OSA - on CPAP 12 cm  02/2011 >> started daliresp   12/2013  nstemi, s/p PCI to diag & RCA  03/2014  cath w/ patent stents . Felt potential angina could be d/t ostial lesion that was best suited for medical therapy along with Imdur.  Review of Systems neg for any significant sore throat, dysphagia, itching, sneezing, nasal congestion or excess/ purulent secretions, fever, chills, sweats, unintended wt loss,  pleuritic or exertional cp, hempoptysis, orthopnea pnd or change in chronic leg swelling. Also denies presyncope, palpitations, heartburn, abdominal pain, nausea, vomiting, diarrhea or change in bowel or urinary habits, dysuria,hematuria, rash, arthralgias, visual complaints, headache, numbness weakness or ataxia.     Objective:   Physical Exam  Gen. Pleasant, well-nourished, in no distress ENT - no lesions, no post nasal drip Neck: No JVD, no thyromegaly, no carotid bruits Lungs: no use of accessory muscles, no dullness to percussion, clear without rales or rhonchi  Cardiovascular: Rhythm regular, heart sounds  normal, no murmurs or gallops, no peripheral edema Musculoskeletal: No deformities, no cyanosis or clubbing        Assessment & Plan:

## 2015-06-29 NOTE — Assessment & Plan Note (Signed)
Ct CPAP 12 cm

## 2015-07-01 MED ORDER — OMALIZUMAB 150 MG ~~LOC~~ SOLR: 300.0000 mg | Freq: Once | SUBCUTANEOUS | Status: DC

## 2015-07-01 MED ORDER — OMALIZUMAB 150 MG ~~LOC~~ SOLR
375.0000 mg | Freq: Once | SUBCUTANEOUS | Status: AC
  Administered 2015-06-25: 375 mg via SUBCUTANEOUS

## 2015-07-02 ENCOUNTER — Other Ambulatory Visit: Payer: Self-pay | Admitting: Cardiovascular Disease

## 2015-07-02 NOTE — Telephone Encounter (Signed)
Rx has been sent to the pharmacy electronically. ° °

## 2015-07-09 ENCOUNTER — Ambulatory Visit: Payer: Medicare Other

## 2015-07-23 ENCOUNTER — Ambulatory Visit (INDEPENDENT_AMBULATORY_CARE_PROVIDER_SITE_OTHER): Payer: Medicare Other

## 2015-07-23 DIAGNOSIS — J4540 Moderate persistent asthma, uncomplicated: Secondary | ICD-10-CM | POA: Diagnosis not present

## 2015-07-27 MED ORDER — OMALIZUMAB 150 MG ~~LOC~~ SOLR
375.0000 mg | Freq: Once | SUBCUTANEOUS | Status: AC
  Administered 2015-07-23: 375 mg via SUBCUTANEOUS

## 2015-08-03 ENCOUNTER — Telehealth: Payer: Self-pay | Admitting: Pulmonary Disease

## 2015-08-03 NOTE — Telephone Encounter (Signed)
Patients son says that patient was taken off Daliresp and on [name] monthly. Her condition has been deteriorating.  She has not been about to do anything, she stays SOB all the time.  She says that she is aware of the risk involved with Daliresp and would like to go back on Daliresp and she wants to go back to taking [name] every other week instead of monthly.    RA - please advise.

## 2015-08-04 NOTE — Telephone Encounter (Signed)
OK to resume daliresp & xolair q 2 weeks

## 2015-08-04 NOTE — Telephone Encounter (Signed)
Left message for Justin to call back

## 2015-08-05 MED ORDER — ROFLUMILAST 500 MCG PO TABS: 500.0000 ug | ORAL_TABLET | Freq: Every day | ORAL | Status: DC

## 2015-08-05 NOTE — Telephone Encounter (Signed)
Spoke with pt's emergency contact Jill Alexanders, aware of recs.  Daliresp sent to preferred pharmacy.   Pt will keep her scheduled xolair appt on 10/24 and resume q14d after that appt. Forwarding to allergy staff to make aware of xolair schedule change.  Nothing further needed.

## 2015-08-06 ENCOUNTER — Telehealth: Payer: Self-pay | Admitting: Pulmonary Disease

## 2015-08-09 ENCOUNTER — Other Ambulatory Visit: Payer: Self-pay | Admitting: Cardiovascular Disease

## 2015-08-09 NOTE — Telephone Encounter (Signed)
REFILL 

## 2015-08-10 NOTE — Telephone Encounter (Signed)
#   vials:6 Ordered date:08/06/15 Shipping Date:08/09/15

## 2015-08-10 NOTE — Telephone Encounter (Signed)
#   Vials:6 Arrival Date:08/10/15 Lot #:[id number]  Exp Date:5/20

## 2015-08-11 NOTE — Telephone Encounter (Signed)
We have [name] Lane' xolair it came in yesterday. Nothing further needed.

## 2015-08-23 ENCOUNTER — Ambulatory Visit (INDEPENDENT_AMBULATORY_CARE_PROVIDER_SITE_OTHER): Payer: Medicare Other

## 2015-08-23 ENCOUNTER — Ambulatory Visit: Payer: Medicare Other

## 2015-08-23 DIAGNOSIS — J4540 Moderate persistent asthma, uncomplicated: Secondary | ICD-10-CM

## 2015-08-23 MED ORDER — OMALIZUMAB 150 MG ~~LOC~~ SOLR
375.0000 mg | Freq: Once | SUBCUTANEOUS | Status: AC
  Administered 2015-08-23: 375 mg via SUBCUTANEOUS

## 2015-09-03 ENCOUNTER — Ambulatory Visit (INDEPENDENT_AMBULATORY_CARE_PROVIDER_SITE_OTHER): Payer: Medicare Other | Admitting: Cardiovascular Disease

## 2015-09-03 VITALS — BP 134/76 | HR 72 | Ht 63.0 in | Wt 193.5 lb

## 2015-09-03 DIAGNOSIS — E669 Obesity, unspecified: Secondary | ICD-10-CM

## 2015-09-03 DIAGNOSIS — J449 Chronic obstructive pulmonary disease, unspecified: Secondary | ICD-10-CM

## 2015-09-03 DIAGNOSIS — I2583 Coronary atherosclerosis due to lipid rich plaque: Secondary | ICD-10-CM

## 2015-09-03 DIAGNOSIS — I2510 Atherosclerotic heart disease of native coronary artery without angina pectoris: Secondary | ICD-10-CM

## 2015-09-03 DIAGNOSIS — Z23 Encounter for immunization: Secondary | ICD-10-CM | POA: Diagnosis not present

## 2015-09-03 MED ORDER — EZETIMIBE 10 MG PO TABS: 10.0000 mg | ORAL_TABLET | Freq: Every day | ORAL | Status: DC

## 2015-09-03 MED ORDER — METOPROLOL TARTRATE 25 MG PO TABS: 25.0000 mg | ORAL_TABLET | Freq: Two times a day (BID) | ORAL | Status: AC

## 2015-09-03 NOTE — Patient Instructions (Signed)
Your physician has recommended you make the following change in your medication: STOP the carvedilol medication. This has been replaced with lopressor ( metoprolol) 25 mg. A new prescription has also been sent in for the zetia.  Your physician recommends that you schedule a follow-up appointment in: 3-4 months

## 2015-09-05 ENCOUNTER — Other Ambulatory Visit: Payer: Self-pay | Admitting: Cardiovascular Disease

## 2015-09-05 ENCOUNTER — Encounter: Payer: Self-pay | Admitting: Cardiovascular Disease

## 2015-09-05 NOTE — Progress Notes (Signed)
Patient ID: [name] Lane, female   DOB: 1941-09-23, 74 y.o.   MRN: [id number]     HPI: [name] Lane is a 74 y.o. female who presents to the office today for a 15 month follow up cardiology evaluation.  Ms. Baccam has a history of hypertension, hyperlipidemia, prior CABG with bare-metal stenting to her RCA and LAD in 2003, and PTCA for in-stent restenosis in 2003.  She was admitted to [facility] on 12/30/2013 with chest pain.  A nuclear study suggested distal anterolateral ischemia and she had mildly positive troponin.  Cardiac catheterization by Dr. Ellyn Hack showed severe 2 vessel CAD with 95% proximal diagonal stenosis which was felt to be the culprit lesion.  She also at 80% RCA stenosis.  She underwent single vessel intervention to the diagonal vessel initially and due to renal insufficiency underwent staged intervention to her proximal RCA with DES stent.  She was seen by Dr. Ellyn Hack on 02/12/2014 and had been on dual antiplatelet therapy.  She was rehospitalized with chest pain and was transferred to from [facility].  She underwent repeat cardiac catheterization and her stents were widely patent and the LAD, diagonal, and right coronary artery.  It was felt that her right posterior lateral 3 lesion was the culprit for chest pain, but due to small size medical therapy was initially recommended with plans for potential cutting balloon intervention if recurrent symptoms developed.  She was also noted to have mild aortic stenosis with a peak gradient of 24 and a mean gradient of 19.   When I saw her in followup in June 2015, I recommended further titration of her carvedilol to 12.5 mg twice a day which improved  her chest pain.  She does note occasional muscle spasms.  She does have a history of depression, for which he takes Prozac.  She denies PND, or orthopnea.  She denies palpitations.  Since I last saw her, I have received prior records from [facility] in [place], [place].  She  is followed by Dr. Elsworth Soho for her COPD and asthma. Apparently, he had tried to stop her Galler rest, but she did not tolerate this.  She admits to occasional wheezing. She is on CPAP at 12 cm water pressure. She denies chest pressure.  She is unaware of palpitations.  She presents for evaluation   Past Medical History  Diagnosis Date  . CAD (coronary artery disease)     a. 2003 BMS to RCA & LAD; PTCA of ISR 2003 b.12/2013 - staged DES PCI of the D1 and mRCA. c. 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  [name] Lane HLD (hyperlipidemia)   . Hypertension   . Type 2 diabetes mellitus treated with insulin ([place])   . COPD (chronic obstructive pulmonary disease) ([place])   . Unspecified asthma(493.90)   . Obstructive sleep apnea   . Edema   . GE reflux   . Carotid bruit   . Anxiety   . CKD (chronic kidney disease), stage III     a. Stage III-IV.  . Mild aortic stenosis     a. By cath 04/09/2014.  [name] Lane Retroperitoneal bleed     a. Hx of this on lovenox in 2001 (had been admitted for NSTEMI).    Past Surgical History  Procedure Laterality Date  . Appendectomy    . Cardiac catheterization  11/18/1999    Small non-STEMI; moderate 50-60% RCA lesion treated medically secondary to percutaneous RP bleed  .  Percutaneous coronary stent intervention (pci-s)  02/17/2002    Two-vessel PCI: 80% mid LAD -- 2.25 mm x 8 mm Express 2 BMS; in RCA RCA 80% 3.5 mm x 12 mm Express 2 BMS  . Coronary angioplasty   04/29/2002    Cutting Balloon PTCA of LAD stent for ISR with 2.5 mm balloon  . Cardiac catheterization  April 2004, October 2010, December 2011    Stable CAD with mild ISR in LAD and RCA; treated medically  . Cardiac catheterization  01/04/2012    4 small non-STEMI; Myoview with lateral ischemia-- 95% proximal D1, 80% mid RCA post stent -- staged PCI   . Percutaneous coronary stent intervention (pci-s)   03/07 & 06/2012    7th - Two-vessel PCI: 80% mid LAD -- 2.25 mm x  8 mm Express 2 BMS; in RCA RCA 80% 3.5 mm x 12 mm Express 2 BMS; 9th - mid RCA a Xience alpine DES 3.5 mm x 15 mm (postdilated 3.5 mm)   . Transthoracic echocardiogram   01/02/2014    (Most recent) moderate concentric LVH. Normal EF of 65-70%. Grade 1 diastolic dysfunction. Mild AI  . Nm myoview ltd  01/01/2014    EF 64%. The distal anterior/anterolateral ischemia -- referred for cath  . Left heart catheterization with coronary angiogram N/A 01/02/2014    Procedure: LEFT HEART CATHETERIZATION WITH CORONARY ANGIOGRAM;  Surgeon: Leonie Man, MD;  Location: [facility] CATH LAB;  Service: Cardiovascular;  Laterality: N/A;  . Percutaneous coronary stent intervention (pci-s) N/A 01/06/2014    Procedure: PERCUTANEOUS CORONARY STENT INTERVENTION (PCI-S);  Surgeon: Leonie Man, MD;  Location: [facility] CATH LAB;  Service: Cardiovascular;  Laterality: N/A;  . Left heart catheterization with coronary angiogram N/A 04/09/2014    Procedure: LEFT HEART CATHETERIZATION WITH CORONARY ANGIOGRAM;  Surgeon: Leonie Man, MD;  Location: [facility] CATH LAB;  Service: Cardiovascular;  Laterality: N/A;    Allergies  Allergen Reactions  . Heparin Other (See Comments)    PATIENT ALMOST DIED FROM SIGNIFICANT BLEEDING EVENT, WAS BROUGHT TO [facility] ED AS A RESULT (2001)  . Morphine And Related Other (See Comments)    CAUSED SIGNIFICANT HYPOTENSION AND PATIENT CRASHED AND NEEDED INSTANT INTERVENTION  . Atorvastatin Other (See Comments)    REACTION: muscle aches  . Benzonatate Other (See Comments)    REACTION: confusion    Current Outpatient Prescriptions  Medication Sig Dispense Refill  . acetaminophen (TYLENOL) 325 MG tablet Take 325-650 mg by mouth every 6 (six) hours as needed for moderate pain.     [name] Lane albuterol (PROVENTIL HFA;VENTOLIN HFA) 108 (90 BASE) MCG/ACT inhaler Inhale 2 puffs into the lungs every 6 (six) hours as needed for wheezing or shortness of breath.    [name] Lane albuterol (PROVENTIL) (2.5 MG/3ML) 0.083% nebulizer solution  Take 2.5 mg by nebulization every 6 (six) hours as needed for wheezing or shortness of breath.    [name] Lane amLODipine (NORVASC) 5 MG tablet Take 5 mg by mouth daily.    [name] Lane aspirin 81 MG tablet Take 81 mg by mouth daily.      [name] Lane BRILINTA 90 MG TABS tablet TAKE 1 TABLET BY MOUTH TWICE DAILY 60 tablet 1  . brimonidine (ALPHAGAN) 0.2 % ophthalmic solution Place 2 drops into both eyes daily. 1 drop in the morning and 1 drop in the evening.  2  . budesonide (PULMICORT) 0.5 MG/2ML nebulizer solution Take 0.5 mg by nebulization 2 (two) times daily.    . cetirizine (ZYRTEC) 10 MG tablet Take  10 mg by mouth daily.      . enalapril (VASOTEC) 20 MG tablet Take 1 tablet by mouth daily.  3  . fenofibrate (TRICOR) 145 MG tablet TAKE 1 TABLET BY MOUTH EVERY DAY 30 tablet 3  . FLUoxetine (PROZAC) 20 MG capsule Take 20 mg by mouth daily.  3  . furosemide (LASIX) 20 MG tablet Take 20-40 mg by mouth See admin instructions. TYPICALLY TAKES 1 TAB DAILY, BUT CAN TAKE ADDT'L TAB AS NEEDED FOR SHORTNESS OF BREATH, FLUID BUILDUP    . gentamicin (GARAMYCIN) 0.3 % ophthalmic ointment Place 1 application into the left eye 2 (two) times daily.    . insulin glargine (LANTUS) 100 UNIT/ML injection Inject 30 Units into the skin at bedtime.     . insulin lispro (HUMALOG) 100 UNIT/ML injection Inject 0-12 Units into the skin 3 (three) times daily with meals. If BG less than 150, no insulin given 151-200, takes 3 units 201-250, takes 5 units  251-300, takes 8 units 301-350, takes 10 units 351-400, takes 12 units 400+, call MD    . isosorbide mononitrate (IMDUR) 30 MG 24 hr tablet TAKE 1 TABLET(30 MG) BY MOUTH DAILY 90 tablet 0  . LORazepam (ATIVAN) 1 MG tablet Take 1 mg by mouth 2 (two) times daily as needed for anxiety or sleep.     . nitroGLYCERIN (NITROSTAT) 0.4 MG SL tablet Place 1 tablet (0.4 mg total) under the tongue every 5 (five) minutes as needed for chest pain. 25 tablet 6  . potassium chloride (K-DUR) 10 MEQ tablet TAKE 1  TABLET BY MOUTH EVERY DAY. TAKE ADDTIONAL 1 TABLET ON DAYS YOU TAKE EXTRA LASIX 30 tablet 11  . pravastatin (PRAVACHOL) 40 MG tablet Take 1 tablet (40 mg total) by mouth daily. (Patient taking differently: Take 40 mg by mouth every evening. ) 30 tablet 1  . prednisoLONE acetate (PRED FORTE) 1 % ophthalmic suspension Place 1 drop into both eyes 3 (three) times daily.  2  . predniSONE (DELTASONE) 5 MG tablet Take 5 mg by mouth daily with breakfast.    . ranitidine (ZANTAC) 150 MG tablet Take 150 mg by mouth 2 (two) times daily.     . roflumilast (DALIRESP) 500 MCG TABS tablet Take 1 tablet (500 mcg total) by mouth daily. 30 tablet 5  . tetracaine (PONTOCAINE) 0.5 % ophthalmic solution Place 2 drops into the left eye every 6 (six) hours as needed (Pain). 2 mL 0  . tiotropium (SPIRIVA) 18 MCG inhalation capsule Place 18 mcg into inhaler and inhale daily.    [name] Lane ezetimibe (ZETIA) 10 MG tablet Take 1 tablet (10 mg total) by mouth daily. 90 tablet 3  . metoprolol tartrate (LOPRESSOR) 25 MG tablet Take 1 tablet (25 mg total) by mouth 2 (two) times daily. 180 tablet 3   No current facility-administered medications for this visit.    Social History   Social History  . Marital Status: Married    Spouse Name: N/A  . Number of Children: N/A  . Years of Education: N/A   Occupational History  . Retirede    Social History Main Topics  . Smoking status: Former Smoker    Quit date: 10/31/1999  . Smokeless tobacco: Never Used  . Alcohol Use: No  . Drug Use: No  . Sexual Activity: Not on file   Other Topics Concern  . Not on file   Social History Narrative   She is a single mother of 7 (no note of being added  divorced or without) 10 grandchildren and 15 great-grandchildren. She lives with her son. Either one of 2 sons or the daughter come with her to appointments. They carry a notebook complete all her medical data. She does all her activities of daily living. She is a former smoker smoked 20 packs a  day for 30 years and quit in 2001. She did not drink alcohol. He does not get routine exercise due to dyspnea.   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    Family History  Problem Relation Age of Onset  . Breast cancer Mother   . Heart disease      ROS General: Negative; No fevers, chills, or night sweats HEENT: Negative; No changes in vision or hearing, sinus congestion, difficulty swallowing Pulmonary: positive for occasional wheezing. Cardiovascular: See HPI: No chest pain, presyncope, syncope, palpitations Positive for hyperlipidemia GI: Negative; No nausea, vomiting, diarrhea, or abdominal pain GU: Negative; No dysuria, hematuria, or difficulty voiding Musculoskeletal: Negative; no myalgias, joint pain, or weakness Hematologic: Negative; no easy bruising, bleeding Endocrine: Negative; no heat/cold intolerance; no diabetes, Neuro: Negative; no changes in balance, headaches Skin: Negative; No rashes or skin lesions Psychiatric: Positive for depression No behavioral problems, Sleep: Negative; No snoring,  daytime sleepiness, hypersomnolence, bruxism, restless legs, hypnogognic hallucinations. Other comprehensive 14 point system review is negative   Physical Exam BP 134/76 mmHg  Pulse 72  Ht _0  (1.6 m)  Wt 193 lb 8 oz (87.771 kg)  BMI 34.29 kg/m2   Wt Readings from Last 3 Encounters:  09/03/15 193 lb 8 oz (87.771 kg)  06/28/15 188 lb 3.2 oz (85.367 kg)  05/30/15 194 lb 14.2 oz (88.4 kg)   General: Alert, oriented, no distress.  Skin: normal turgor, no rashes, warm and dry HEENT: Normocephalic, atraumatic. Pupils equal round and reactive to light; sclera anicteric; extraocular muscles intact, No lid lag; Nose without nasal septal hypertrophy; Mouth/Parynx benign; Mallinpatti scale 3 Neck: No JVD, no carotid bruits; normal carotid upstroke Lungs: clear to ausculatation and percussion bilaterally; no wheezing or rales, normal inspiratory and expiratory effort Chest wall:  without tenderness to palpitation Heart: PMI not displaced, RRR, s1 s2 normal, 1/6 systolic murmur, No diastolic murmur, no rubs, gallops, thrills, or heaves Abdomen: soft, nontender; no hepatosplenomehaly, BS+; abdominal aorta nontender and not dilated by palpation. Back: no CVA tenderness Pulses: 2+  Musculoskeletal: full range of motion, normal strength, no joint deformities Extremities: Pulses 2+, no clubbing cyanosis or edema, Homan's sign negative  Neurologic: grossly nonfocal; Cranial nerves grossly wnl Psychologic: Normal mood and affect  ECG (independently read by me):  Normal sinus rhythm at 72 bpm.  Nonspecific T changes.  September 2015 ECG (independently read by me): Normal sinus rhythm at 71 beats per minute.  Nonspecific ST changes.  04/20/2014 ECG (independently read by me): Normal sinus rhythm at 85 beats per minute.  Nonspecific ST changes.  LABS:  BMP Latest Ref Rng 06/01/2015 05/31/2015 05/30/2015  Glucose 65 - 99 mg/dL 128(H) 187(H) 177(H)  BUN 6 - 20 mg/dL 14 24(H) 29(H)  Creatinine 0.44 - 1.00 mg/dL 1.62(H) 2.06(H) 2.65(H)  Sodium 135 - 145 mmol/L 139 137 137  Potassium 3.5 - 5.1 mmol/L 3.7 3.6 4.1  Chloride 101 - 111 mmol/L 110 109 104  CO2 22 - 32 mmol/L 21(L) 23 19(L)  Calcium 8.9 - 10.3 mg/dL 9.0 8.6(L) 9.0   Hepatic Function Latest Ref Rng 05/31/2015 02/08/2015 11/03/2014  Total Protein 6.5 - 8.1 g/dL 6.0(L) 7.3 7.0  Albumin 3.5 -  5.0 g/dL 3.4(L) 4.3 4.1  AST 15 - 41 U/L _0 ALT 14 - 54 U/L _1 Alk Phosphatase 38 - 126 U/L 31(L) 42 63  Total Bilirubin 0.3 - 1.2 mg/dL 0.6 0.6 0.5  Bilirubin, Direct 0.0 - 0.3 mg/dL - - -   CBC Latest Ref Rng 06/01/2015 05/31/2015 05/30/2015  WBC 4.0 - 10.5 K/uL 8.6 7.7 8.9  Hemoglobin 12.0 - 15.0 g/dL 9.5(L) 9.3(L) 10.1(L)  Hematocrit 36.0 - 46.0 % 30.5(L) 29.1(L) 30.7(L)  Platelets 150 - 400 K/uL 313 292 333   Lab Results  Component Value Date   MCV 78.4 06/01/2015   MCV 78.4 05/31/2015   MCV 77.1* 05/30/2015    Lab Results  Component Value Date   TSH 0.255* 12/31/2013   Lab Results  Component Value Date   HGBA1C 9.0* 01/01/2014   Lipid Panel     Component Value Date/Time   CHOL 186 02/08/2015 1345   TRIG 205* 02/08/2015 1345   HDL 47 02/08/2015 1345   CHOLHDL 4.0 02/08/2015 1345   VLDL 41* 02/08/2015 1345   LDLCALC 98 02/08/2015 1345   LDLDIRECT 136.1 11/02/2010 1300   RADIOLOGY: Dg Ribs Unilateral W/chest Right  04/08/2014   CLINICAL DATA:  Fall, right rib pain  EXAM: RIGHT RIBS AND CHEST - 3+ VIEW  COMPARISON:  04/07/2014  FINDINGS: Three views right ribs submitted. No acute infiltrate or pulmonary edema. No rib fracture. No pneumothorax.  IMPRESSION: Negative.   Electronically Signed   By: Lahoma Crocker M.D.   On: 04/08/2014 09:13      ASSESSMENT AND PLAN: Ms.Kluesner is a 74 year old female, who is  status post prior intervention to her LAD and RCA with bare-metal stents.  She developed unstable angina symptoms in March 2015 and underwent staged DES PCI of the diagonal vessel and mid RCA. Due to recurrent chest pain repeat catheterization revealed previous stented segments to be patent.  However, she did have progressive disease in a a small third right posterolateral branch for which initial medical therapy was recommended.  Her blood pressure today on repeat by me was 130/74.  On her current medical regimen consisting of amlodipine 5 mg, carvedilol 12.5 twice a day, enalapril 20 mg daily.  She is on pravastatin 40 mg, fenofibrate, and Zetia for hyperlipidemia and is tolerating this well with a recent LDL at 98.however, she had run out of her Zetia which may explain her increased LDL.  However, if her LDL continues to be greater than 70 despite reinitiation of Zetia it may be worthwhile to increase her pravastatin to 80 mg or switch her to a more potent statin drug in the future.  She continues to be on dual antiplatelet therapy with aspirin and Plavix and denies bleeding.  She does have some  issues with wheezing.  For this reason, I will discontinue carvedilol and substitute this with metoprolol tartrate 25 mg twice a day.  She will return in 3-4 months for follow-up evaluation  Time spent: 30 minutes  Troy Sine, MD, [facility]  09/05/2015 3:56 PM

## 2015-09-07 ENCOUNTER — Encounter: Payer: Self-pay | Admitting: Cardiovascular Disease

## 2015-09-07 NOTE — Addendum Note (Signed)
Addended by: Evans LanceSTOVER, Sherol Sabas W on: 09/07/2015 03:59 PM   Modules accepted: Orders

## 2015-09-10 ENCOUNTER — Ambulatory Visit (INDEPENDENT_AMBULATORY_CARE_PROVIDER_SITE_OTHER): Payer: Medicare Other

## 2015-09-10 DIAGNOSIS — J4540 Moderate persistent asthma, uncomplicated: Secondary | ICD-10-CM

## 2015-09-13 ENCOUNTER — Telehealth: Payer: Self-pay

## 2015-09-13 MED ORDER — OMALIZUMAB 150 MG ~~LOC~~ SOLR
375.0000 mg | Freq: Once | SUBCUTANEOUS | Status: AC
  Administered 2015-09-10: 375 mg via SUBCUTANEOUS

## 2015-09-13 NOTE — Telephone Encounter (Signed)
#   Vials:6 Arrival Date:09/13/2015 Lot #[id number] (X4), [id number] (X2) Exp Date:08/2017 (X4), 09/2018 (X2)

## 2015-09-24 ENCOUNTER — Ambulatory Visit (INDEPENDENT_AMBULATORY_CARE_PROVIDER_SITE_OTHER): Payer: Medicare Other

## 2015-09-24 DIAGNOSIS — J4540 Moderate persistent asthma, uncomplicated: Secondary | ICD-10-CM

## 2015-09-27 MED ORDER — OMALIZUMAB 150 MG ~~LOC~~ SOLR
375.0000 mg | Freq: Once | SUBCUTANEOUS | Status: AC
  Administered 2015-09-24: 375 mg via SUBCUTANEOUS

## 2015-10-05 ENCOUNTER — Other Ambulatory Visit: Payer: Self-pay | Admitting: Cardiovascular Disease

## 2015-10-05 NOTE — Telephone Encounter (Signed)
Rx(s) sent to pharmacy electronically.  

## 2015-10-08 ENCOUNTER — Ambulatory Visit: Payer: Medicare Other

## 2015-10-22 ENCOUNTER — Ambulatory Visit (INDEPENDENT_AMBULATORY_CARE_PROVIDER_SITE_OTHER): Payer: Medicare Other

## 2015-10-22 DIAGNOSIS — J4540 Moderate persistent asthma, uncomplicated: Secondary | ICD-10-CM | POA: Diagnosis not present

## 2015-10-22 DIAGNOSIS — Z23 Encounter for immunization: Secondary | ICD-10-CM | POA: Diagnosis not present

## 2015-10-27 ENCOUNTER — Telehealth: Payer: Self-pay | Admitting: Internal Medicine

## 2015-10-28 MED ORDER — OMALIZUMAB 150 MG ~~LOC~~ SOLR
375.0000 mg | Freq: Once | SUBCUTANEOUS | Status: AC
  Administered 2015-10-28: 375 mg via SUBCUTANEOUS

## 2015-10-29 NOTE — Telephone Encounter (Signed)
#   vials:10/27/15 Ordered date:10/27/15 Shipping Date:10/28/15

## 2015-10-29 NOTE — Telephone Encounter (Signed)
#   Vials:6 Arrival Date:10/29/15 Lot #[id number] Exp Date:6/20

## 2015-11-05 ENCOUNTER — Ambulatory Visit (INDEPENDENT_AMBULATORY_CARE_PROVIDER_SITE_OTHER): Payer: Medicare Other

## 2015-11-05 DIAGNOSIS — J4540 Moderate persistent asthma, uncomplicated: Secondary | ICD-10-CM

## 2015-11-08 MED ORDER — OMALIZUMAB 150 MG ~~LOC~~ SOLR
375.0000 mg | Freq: Once | SUBCUTANEOUS | Status: AC
  Administered 2015-11-08: 375 mg via SUBCUTANEOUS

## 2015-11-14 ENCOUNTER — Other Ambulatory Visit: Payer: Self-pay | Admitting: Cardiovascular Disease

## 2015-11-19 ENCOUNTER — Ambulatory Visit (INDEPENDENT_AMBULATORY_CARE_PROVIDER_SITE_OTHER): Payer: Medicare Other

## 2015-11-19 DIAGNOSIS — J4540 Moderate persistent asthma, uncomplicated: Secondary | ICD-10-CM

## 2015-11-22 MED ORDER — OMALIZUMAB 150 MG ~~LOC~~ SOLR
375.0000 mg | Freq: Once | SUBCUTANEOUS | Status: AC
  Administered 2015-11-19: 375 mg via SUBCUTANEOUS

## 2015-11-24 ENCOUNTER — Telehealth: Payer: Self-pay | Admitting: Pulmonary Disease

## 2015-11-24 NOTE — Telephone Encounter (Signed)
#   vials:6 Ordered date:11/24/15 Shipping Date:11/25/15 

## 2015-11-25 NOTE — Telephone Encounter (Signed)
#   Vials:6 Arrival Date:11/25/15 Lot #[id number] Exp Date:6/20  

## 2015-12-03 ENCOUNTER — Ambulatory Visit (INDEPENDENT_AMBULATORY_CARE_PROVIDER_SITE_OTHER): Payer: Medicare Other

## 2015-12-03 DIAGNOSIS — J4540 Moderate persistent asthma, uncomplicated: Secondary | ICD-10-CM | POA: Diagnosis not present

## 2015-12-06 MED ORDER — OMALIZUMAB 150 MG ~~LOC~~ SOLR
375.0000 mg | Freq: Once | SUBCUTANEOUS | Status: AC
  Administered 2015-12-03: 375 mg via SUBCUTANEOUS

## 2015-12-16 ENCOUNTER — Other Ambulatory Visit: Payer: Self-pay | Admitting: Cardiovascular Disease

## 2015-12-17 ENCOUNTER — Ambulatory Visit (INDEPENDENT_AMBULATORY_CARE_PROVIDER_SITE_OTHER): Payer: Medicare Other

## 2015-12-17 DIAGNOSIS — J4540 Moderate persistent asthma, uncomplicated: Secondary | ICD-10-CM

## 2015-12-20 ENCOUNTER — Telehealth: Payer: Self-pay | Admitting: Pulmonary Disease

## 2015-12-20 MED ORDER — OMALIZUMAB 150 MG ~~LOC~~ SOLR
375.0000 mg | Freq: Once | SUBCUTANEOUS | Status: AC
  Administered 2015-12-17: 375 mg via SUBCUTANEOUS

## 2015-12-21 NOTE — Telephone Encounter (Signed)
#   Vials:6 Arrival Date:12/21/15 Lot #:[id number] Exp Date:9/20  

## 2015-12-21 NOTE — Telephone Encounter (Addendum)
#   vials:6 Ordered date:12/20/15 Shipping Date:12/21/15 Completely forgot to put this in.

## 2015-12-31 ENCOUNTER — Ambulatory Visit (INDEPENDENT_AMBULATORY_CARE_PROVIDER_SITE_OTHER): Payer: Medicare Other

## 2015-12-31 DIAGNOSIS — J4540 Moderate persistent asthma, uncomplicated: Secondary | ICD-10-CM

## 2016-01-03 MED ORDER — OMALIZUMAB 150 MG ~~LOC~~ SOLR
375.0000 mg | Freq: Once | SUBCUTANEOUS | Status: AC
  Administered 2015-12-31: 375 mg via SUBCUTANEOUS

## 2016-01-07 ENCOUNTER — Other Ambulatory Visit: Payer: Self-pay | Admitting: Cardiovascular Disease

## 2016-01-07 NOTE — Telephone Encounter (Signed)
Rx request sent to pharmacy.  

## 2016-01-14 ENCOUNTER — Ambulatory Visit (INDEPENDENT_AMBULATORY_CARE_PROVIDER_SITE_OTHER): Payer: Medicare Other

## 2016-01-14 DIAGNOSIS — J4540 Moderate persistent asthma, uncomplicated: Secondary | ICD-10-CM

## 2016-01-17 MED ORDER — OMALIZUMAB 150 MG ~~LOC~~ SOLR
375.0000 mg | Freq: Once | SUBCUTANEOUS | Status: AC
  Administered 2016-01-17: 375 mg via SUBCUTANEOUS

## 2016-01-20 ENCOUNTER — Telehealth: Payer: Self-pay | Admitting: Pulmonary Disease

## 2016-01-20 NOTE — Telephone Encounter (Signed)
#   vials:6 Ordered date:01/20/16 Shipping Date:3/24 or 01/24/16 

## 2016-01-21 NOTE — Telephone Encounter (Signed)
#   Vials:6 Arrival Date:01/21/2016  Lot #:[id number] Exp Date:08/2019 

## 2016-01-28 ENCOUNTER — Ambulatory Visit (INDEPENDENT_AMBULATORY_CARE_PROVIDER_SITE_OTHER): Payer: Medicare Other

## 2016-01-28 DIAGNOSIS — J4540 Moderate persistent asthma, uncomplicated: Secondary | ICD-10-CM | POA: Diagnosis not present

## 2016-01-28 DIAGNOSIS — J4550 Severe persistent asthma, uncomplicated: Secondary | ICD-10-CM | POA: Diagnosis not present

## 2016-01-31 MED ORDER — OMALIZUMAB 150 MG ~~LOC~~ SOLR
375.0000 mg | Freq: Once | SUBCUTANEOUS | Status: AC
  Administered 2016-01-28: 375 mg via SUBCUTANEOUS

## 2016-02-13 ENCOUNTER — Other Ambulatory Visit: Payer: Self-pay | Admitting: Pulmonary Disease

## 2016-02-14 ENCOUNTER — Ambulatory Visit (INDEPENDENT_AMBULATORY_CARE_PROVIDER_SITE_OTHER): Payer: Medicare Other

## 2016-02-14 DIAGNOSIS — J4540 Moderate persistent asthma, uncomplicated: Secondary | ICD-10-CM

## 2016-02-15 MED ORDER — OMALIZUMAB 150 MG ~~LOC~~ SOLR
375.0000 mg | Freq: Once | SUBCUTANEOUS | Status: AC
  Administered 2016-02-14: 375 mg via SUBCUTANEOUS

## 2016-02-22 ENCOUNTER — Telehealth: Payer: Self-pay | Admitting: Pulmonary Disease

## 2016-02-22 ENCOUNTER — Other Ambulatory Visit: Payer: Self-pay | Admitting: Pulmonary Disease

## 2016-02-22 NOTE — Telephone Encounter (Signed)
LVM for pt to return call

## 2016-02-23 MED ORDER — TIOTROPIUM BROMIDE MONOHYDRATE 18 MCG IN CAPS: 18.0000 ug | ORAL_CAPSULE | Freq: Every day | RESPIRATORY_TRACT | Status: AC

## 2016-02-23 NOTE — Telephone Encounter (Signed)
Returning call to the nurse Jill AlexandersJustin [phone]

## 2016-02-23 NOTE — Telephone Encounter (Signed)
Called spoke with Jill AlexandersJustin - appt scheduled with TP for 5.23.17 @ 1115.  Jill AlexandersJustin okay with this date/time.  Rx sent to verified pharmacy.

## 2016-02-23 NOTE — Telephone Encounter (Signed)
Last ov on 06/28/15 with RA No up coming ov has been made  Patient Instructions        STOP taking daliresp Decrease [name] to every 4 weeks Call as needed Your dizziness may be related to side effect of medication - careful when you stand up from a sitting /lying down position   Ivanhoealled spoke with BarnesdaleJustin. He states that he patient only has a enough spiriva to last till the end of the week. He states that the rx was filled by her PCP but that her PCP is moving into a teaching position and is unable to refill the medication. He is requesting that RA refill the symbicort. I explained to him that I would have to send a message to RA for approval. He voiced understanding and had no further questions.   RA please advise

## 2016-02-23 NOTE — Telephone Encounter (Addendum)
#   Vials:6 Arrival Date:02/23/16 Lot #[id number] Exp Date:11/20

## 2016-02-23 NOTE — Telephone Encounter (Addendum)
#   vials:6 Ordered date:02/22/16 I found out the xolair order sheet was in here at. 5:27. Karilyn CotaKaite is aware. Shipping Date:02/23/16

## 2016-02-23 NOTE — Telephone Encounter (Signed)
Okay to refill Please obtain follow-up visit with TP in the next month

## 2016-02-28 ENCOUNTER — Ambulatory Visit (INDEPENDENT_AMBULATORY_CARE_PROVIDER_SITE_OTHER): Payer: Medicare Other

## 2016-02-28 DIAGNOSIS — J4540 Moderate persistent asthma, uncomplicated: Secondary | ICD-10-CM

## 2016-02-28 MED ORDER — OMALIZUMAB 150 MG ~~LOC~~ SOLR
375.0000 mg | Freq: Once | SUBCUTANEOUS | Status: AC
  Administered 2016-02-28: 375 mg via SUBCUTANEOUS

## 2016-03-13 ENCOUNTER — Ambulatory Visit (INDEPENDENT_AMBULATORY_CARE_PROVIDER_SITE_OTHER): Payer: Medicare Other

## 2016-03-13 DIAGNOSIS — J4540 Moderate persistent asthma, uncomplicated: Secondary | ICD-10-CM | POA: Diagnosis not present

## 2016-03-14 MED ORDER — OMALIZUMAB 150 MG ~~LOC~~ SOLR
375.0000 mg | Freq: Once | SUBCUTANEOUS | Status: AC
  Administered 2016-03-13: 375 mg via SUBCUTANEOUS

## 2016-03-19 ENCOUNTER — Other Ambulatory Visit: Payer: Self-pay | Admitting: Cardiovascular Disease

## 2016-03-19 ENCOUNTER — Other Ambulatory Visit: Payer: Self-pay | Admitting: Pulmonary Disease

## 2016-03-21 ENCOUNTER — Ambulatory Visit (INDEPENDENT_AMBULATORY_CARE_PROVIDER_SITE_OTHER): Payer: Medicare Other | Admitting: Adult Health

## 2016-03-21 ENCOUNTER — Encounter: Payer: Self-pay | Admitting: Adult Health

## 2016-03-21 ENCOUNTER — Other Ambulatory Visit: Payer: Self-pay | Admitting: Cardiovascular Disease

## 2016-03-21 ENCOUNTER — Ambulatory Visit: Payer: Medicare Other | Admitting: Adult Health

## 2016-03-21 VITALS — BP 110/60 | HR 92 | Temp 97.5°F | Ht 62.0 in | Wt 189.0 lb

## 2016-03-21 DIAGNOSIS — Z9989 Dependence on other enabling machines and devices: Secondary | ICD-10-CM

## 2016-03-21 DIAGNOSIS — J449 Chronic obstructive pulmonary disease, unspecified: Secondary | ICD-10-CM

## 2016-03-21 DIAGNOSIS — G4733 Obstructive sleep apnea (adult) (pediatric): Secondary | ICD-10-CM | POA: Diagnosis not present

## 2016-03-21 NOTE — Assessment & Plan Note (Signed)
Controlled on current regimen   Plan  Continue on Pulmicort Neb Twice daily   Continue on Spiriva daily   Continue on Prednisone 5mg  daily  Continue with [name] .  Discuss with family doctor that Enalapril may cause a cough to be worse .  Follow up Dr. Vassie [name]  In 6  months and As needed

## 2016-03-21 NOTE — Telephone Encounter (Signed)
Rx(s) sent to pharmacy electronically.  

## 2016-03-21 NOTE — Progress Notes (Signed)
Subjective:    Patient ID: [name] Lane, female    DOB: 12-14-1940, 75 y.o.   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 Triggers being activity, perfumes, spring allergies (claritin helps) , cold weather etc. Spirometry >> FEv1 48 % c/w severe airway obstruction.   ON spiriva since june 2011with improved exercise tolerance  02/2011 >> started daliresp  Has a dog &  bird- cockatoo, clean cage.  RAST - IGE 636,  +house dust, mildly pos for cat & dog dander   PSG -Moderate OSA corrected by 12 cm   Last xolair on 12/19/13 -has done remarkably well on xolair     03/21/2016 Follow up - COPD/asthma,  Returns for 6 month follow up for COPD and Asthma  Currently on pulmicort neb Twice daily , spiriva daily , Daliresp and [name]. Compliant with meds.  Remains on daily prednisone  daily .  No flare of cough, dyspnea or wheezing.  No increased SABA use. Rare use.  No hemoptysis , orthopnea, increased leg swelling or fever.  Is on Ace inhibitor , but denies flare of cough , discussed this medication can cause cough .   Wears CPAP At bedtime  On avg 6 hr each night.  No issues with mask or machine.  Feels rested in am.  No increased daytime sleepiness.   Past Medical History  Diagnosis Date  . CAD (coronary artery disease)     a. 2003 BMS to RCA & LAD; PTCA of ISR 2003 b.12/2013 - staged DES PCI of the D1 and mRCA. c. 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  Marland Kitchen HLD (hyperlipidemia)   . Hypertension   . Type 2 diabetes mellitus treated with insulin (HCC)   . COPD (chronic obstructive pulmonary disease) (HCC)   . Unspecified asthma(493.90)   . Obstructive sleep apnea   . Edema   . GE  reflux   . Carotid bruit   . Anxiety   . CKD (chronic kidney disease), stage III     a. Stage III-IV.  . Mild aortic stenosis     a. By cath 04/09/2014.  Marland Kitchen Retroperitoneal bleed     a. Hx of this on lovenox in 2001 (had been admitted for NSTEMI).   Current Outpatient Prescriptions on File Prior to Visit  Medication Sig Dispense Refill  . acetaminophen (TYLENOL) 325 MG tablet Take 325-650 mg by mouth every 6 (six) hours as needed for moderate pain.     Marland Kitchen albuterol (PROVENTIL HFA;VENTOLIN HFA) 108 (90 BASE) MCG/ACT inhaler Inhale 2 puffs into the lungs every 6 (six) hours as needed for wheezing or shortness of breath.    Marland Kitchen albuterol (PROVENTIL) (2.5 MG/3ML) 0.083% nebulizer solution Take 2.5 mg by nebulization every 6 (six) hours as needed for wheezing or shortness of breath.    Marland Kitchen amLODipine (NORVASC) 5 MG tablet Take 5 mg by mouth daily.    Marland Kitchen aspirin 81 MG tablet Take 81 mg by mouth daily.      Marland Kitchen BRILINTA 90 MG TABS tablet TAKE 1 TABLET BY MOUTH TWICE DAILY 60 tablet 6  . brimonidine (ALPHAGAN) 0.2 % ophthalmic solution Place 2 drops into both eyes  daily. 1 drop in the morning and 1 drop in the evening.  2  . budesonide (PULMICORT) 0.5 MG/2ML nebulizer solution Take 0.5 mg by nebulization 2 (two) times daily.    . cetirizine (ZYRTEC) 10 MG tablet Take 10 mg by mouth daily.      Marland Kitchen DALIRESP 500 MCG TABS tablet TAKE 1 TABLET BY MOUTH EVERY DAY 30 tablet 0  . enalapril (VASOTEC) 20 MG tablet Take 1 tablet by mouth daily.  3  . ezetimibe (ZETIA) 10 MG tablet Take 1 tablet (10 mg total) by mouth daily. 90 tablet 3  . fenofibrate (TRICOR) 145 MG tablet Take 1 tablet (145 mg total) by mouth daily. PLEASE CONTACT OFFICE FOR ADDITIONAL REFILLS 15 tablet 0  . FLUoxetine (PROZAC) 20 MG capsule Take 20 mg by mouth daily.  3  . furosemide (LASIX) 20 MG tablet Take 20-40 mg by mouth See admin instructions. TYPICALLY TAKES 1 TAB DAILY, BUT CAN TAKE ADDT'L TAB AS NEEDED FOR SHORTNESS OF BREATH, FLUID BUILDUP     . gentamicin (GARAMYCIN) 0.3 % ophthalmic ointment Place 1 application into the left eye 2 (two) times daily.    . insulin glargine (LANTUS) 100 UNIT/ML injection Inject 30 Units into the skin at bedtime.     . insulin lispro (HUMALOG) 100 UNIT/ML injection Inject 0-12 Units into the skin 3 (three) times daily with meals. If BG less than 150, no insulin given 151-200, takes 3 units 201-250, takes 5 units  251-300, takes 8 units 301-350, takes 10 units 351-400, takes 12 units 400+, call MD    . isosorbide mononitrate (IMDUR) 30 MG 24 hr tablet TAKE 1 TABLET(30 MG) BY MOUTH DAILY 90 tablet 0  . LORazepam (ATIVAN) 1 MG tablet Take 1 mg by mouth 2 (two) times daily as needed for anxiety or sleep.     . metoprolol tartrate (LOPRESSOR) 25 MG tablet Take 1 tablet (25 mg total) by mouth 2 (two) times daily. 180 tablet 3  . nitroGLYCERIN (NITROSTAT) 0.4 MG SL tablet Place 1 tablet (0.4 mg total) under the tongue every 5 (five) minutes as needed for chest pain. 25 tablet 6  . potassium chloride (K-DUR) 10 MEQ tablet TAKE 1 TABLET BY MOUTH EVERY DAY. TAKE ADDTIONAL 1 TABLET ON DAYS YOU TAKE EXTRA LASIX 30 tablet 11  . pravastatin (PRAVACHOL) 40 MG tablet Take 1 tablet (40 mg total) by mouth daily. (Patient taking differently: Take 40 mg by mouth every evening. ) 30 tablet 1  . prednisoLONE acetate (PRED FORTE) 1 % ophthalmic suspension Place 1 drop into both eyes 3 (three) times daily.  2  . predniSONE (DELTASONE) 5 MG tablet Take 5 mg by mouth daily with breakfast.    . ranitidine (ZANTAC) 150 MG tablet Take 150 mg by mouth 2 (two) times daily.     Marland Kitchen tetracaine (PONTOCAINE) 0.5 % ophthalmic solution Place 2 drops into the left eye every 6 (six) hours as needed (Pain). 2 mL 0  . tiotropium (SPIRIVA) 18 MCG inhalation capsule Place 1 capsule (18 mcg total) into inhaler and inhale daily. 30 capsule 1   No current facility-administered medications on file prior to visit.       Review of Systems neg  for any significant sore throat, dysphagia, itching, sneezing, nasal congestion or excess/ purulent secretions, fever, chills, sweats, unintended wt loss, pleuritic or exertional cp, hempoptysis, orthopnea pnd or change in chronic leg swelling. Also denies presyncope, palpitations, heartburn, abdominal pain, nausea, vomiting, diarrhea or change in bowel  or urinary habits, dysuria,hematuria, rash, arthralgias, visual complaints, headache, numbness weakness or ataxia.     Objective:   Physical Exam   Filed Vitals:   03/21/16 1502  BP: 110/60  Pulse: 92  Temp: 97.5 F (36.4 C)  TempSrc: Oral  Height: 5\' 2"  (1.575 m)  Weight: 189 lb (85.73 kg)  SpO2: 100%    Gen. Pleasant, obese, in no distress, obese  ENT - no lesions, no post nasal drip Neck: No JVD, no thyromegaly, no carotid bruits Lungs: no use of accessory muscles, no dullness to percussion, decreased without rales or rhonchi  Cardiovascular: Rhythm regular, heart sounds  normal, no murmurs or gallops, no peripheral edema Musculoskeletal: No deformities, no cyanosis or clubbing , no tremors  Tammy Parrett NP-C  [facility]  03/21/2016       Assessment & Plan:

## 2016-03-21 NOTE — Assessment & Plan Note (Signed)
Cont on CPAP  

## 2016-03-21 NOTE — Patient Instructions (Signed)
Continue on Pulmicort Neb Twice daily   Continue on Spiriva daily  Continue on CPAP At bedtime  .  Continue on Prednisone 5mg  daily  Continue with [name] .  Discuss with family doctor that Enalapril may cause a cough to be worse .  Follow up Dr. Vassie [name]  In 6  months and As needed

## 2016-03-22 ENCOUNTER — Telehealth: Payer: Self-pay | Admitting: *Deleted

## 2016-03-23 NOTE — Telephone Encounter (Signed)
#   vials:6 Ordered date:03/22/16 Shipping Date:03/23/16  # Vials:6  Arrival Date:03/23/16 Lot [id number]#:[id number] Exp Date:12/20

## 2016-03-28 ENCOUNTER — Ambulatory Visit (INDEPENDENT_AMBULATORY_CARE_PROVIDER_SITE_OTHER): Payer: Medicare Other

## 2016-03-28 DIAGNOSIS — J4540 Moderate persistent asthma, uncomplicated: Secondary | ICD-10-CM

## 2016-03-29 MED ORDER — OMALIZUMAB 150 MG ~~LOC~~ SOLR
375.0000 mg | Freq: Once | SUBCUTANEOUS | Status: AC
  Administered 2016-03-28: 375 mg via SUBCUTANEOUS

## 2016-04-11 ENCOUNTER — Ambulatory Visit (INDEPENDENT_AMBULATORY_CARE_PROVIDER_SITE_OTHER): Payer: Medicare Other

## 2016-04-11 DIAGNOSIS — J4540 Moderate persistent asthma, uncomplicated: Secondary | ICD-10-CM

## 2016-04-12 MED ORDER — OMALIZUMAB 150 MG ~~LOC~~ SOLR
375.0000 mg | Freq: Once | SUBCUTANEOUS | Status: AC
  Administered 2016-04-11: 375 mg via SUBCUTANEOUS

## 2016-04-19 ENCOUNTER — Telehealth: Payer: Self-pay | Admitting: Pulmonary Disease

## 2016-04-19 NOTE — Telephone Encounter (Signed)
#   vials:6 Ordered date:04/19/16 Shipping Date: 04/20/16

## 2016-04-20 NOTE — Telephone Encounter (Signed)
#   Vials:6 Arrival Date:04-20-16 Lot #[id number] Exp Date:09/2019

## 2016-04-25 ENCOUNTER — Ambulatory Visit (INDEPENDENT_AMBULATORY_CARE_PROVIDER_SITE_OTHER): Payer: Medicare Other

## 2016-04-25 ENCOUNTER — Ambulatory Visit: Payer: Medicare Other

## 2016-04-25 DIAGNOSIS — J4540 Moderate persistent asthma, uncomplicated: Secondary | ICD-10-CM

## 2016-04-27 MED ORDER — OMALIZUMAB 150 MG ~~LOC~~ SOLR
375.0000 mg | Freq: Once | SUBCUTANEOUS | Status: AC
  Administered 2016-04-25: 375 mg via SUBCUTANEOUS

## 2016-04-28 ENCOUNTER — Encounter: Payer: Self-pay | Admitting: Pulmonary Disease

## 2016-05-09 ENCOUNTER — Ambulatory Visit (INDEPENDENT_AMBULATORY_CARE_PROVIDER_SITE_OTHER): Payer: Medicare Other

## 2016-05-09 DIAGNOSIS — J4540 Moderate persistent asthma, uncomplicated: Secondary | ICD-10-CM | POA: Diagnosis not present

## 2016-05-10 MED ORDER — OMALIZUMAB 150 MG ~~LOC~~ SOLR
375.0000 mg | Freq: Once | SUBCUTANEOUS | Status: AC
  Administered 2016-05-09: 375 mg via SUBCUTANEOUS

## 2016-05-17 ENCOUNTER — Other Ambulatory Visit: Payer: Self-pay | Admitting: Cardiovascular Disease

## 2016-05-17 NOTE — Telephone Encounter (Signed)
REFILL 

## 2016-05-19 ENCOUNTER — Telehealth: Payer: Self-pay | Admitting: Pulmonary Disease

## 2016-05-19 NOTE — Telephone Encounter (Signed)
#   vials:6 Ordered date:05/19/16 Shipping Date:05/22/16

## 2016-05-22 NOTE — Telephone Encounter (Signed)
#   Vials:6 Arrival Date:05/22/16 Lot #[id number] Exp Date:12/20

## 2016-05-23 ENCOUNTER — Ambulatory Visit (INDEPENDENT_AMBULATORY_CARE_PROVIDER_SITE_OTHER): Payer: Medicare Other

## 2016-05-23 DIAGNOSIS — J4540 Moderate persistent asthma, uncomplicated: Secondary | ICD-10-CM | POA: Diagnosis not present

## 2016-05-24 MED ORDER — OMALIZUMAB 150 MG ~~LOC~~ SOLR
375.0000 mg | SUBCUTANEOUS | Status: DC
  Administered 2016-05-23: 375 mg via SUBCUTANEOUS

## 2016-06-06 ENCOUNTER — Ambulatory Visit (INDEPENDENT_AMBULATORY_CARE_PROVIDER_SITE_OTHER): Payer: Medicare Other

## 2016-06-06 DIAGNOSIS — J4540 Moderate persistent asthma, uncomplicated: Secondary | ICD-10-CM | POA: Diagnosis not present

## 2016-06-06 MED ORDER — OMALIZUMAB 150 MG ~~LOC~~ SOLR
375.0000 mg | SUBCUTANEOUS | Status: DC
  Administered 2016-06-06: 375 mg via SUBCUTANEOUS

## 2016-06-16 ENCOUNTER — Telehealth: Payer: Self-pay | Admitting: Pulmonary Disease

## 2016-06-16 NOTE — Telephone Encounter (Signed)
#   vials:6 Ordered date:06/16/16 Shipping Date:06/19/16

## 2016-06-19 NOTE — Telephone Encounter (Signed)
#   Vials:6 Arrival Date:06/19/16 Lot #[id number]#:[id number] Exp Date:3/21

## 2016-06-20 ENCOUNTER — Ambulatory Visit (INDEPENDENT_AMBULATORY_CARE_PROVIDER_SITE_OTHER): Payer: Medicare Other

## 2016-06-20 DIAGNOSIS — J4540 Moderate persistent asthma, uncomplicated: Secondary | ICD-10-CM

## 2016-06-21 MED ORDER — OMALIZUMAB 150 MG ~~LOC~~ SOLR
375.0000 mg | Freq: Once | SUBCUTANEOUS | Status: AC
  Administered 2016-06-20: 375 mg via SUBCUTANEOUS

## 2016-06-30 ENCOUNTER — Ambulatory Visit: Payer: Medicare Other | Admitting: Adult Health

## 2016-07-04 ENCOUNTER — Ambulatory Visit (INDEPENDENT_AMBULATORY_CARE_PROVIDER_SITE_OTHER): Payer: Medicare Other

## 2016-07-04 DIAGNOSIS — J4540 Moderate persistent asthma, uncomplicated: Secondary | ICD-10-CM | POA: Diagnosis not present

## 2016-07-05 MED ORDER — OMALIZUMAB 150 MG ~~LOC~~ SOLR
375.0000 mg | SUBCUTANEOUS | Status: DC
  Administered 2016-07-04: 375 mg via SUBCUTANEOUS

## 2016-07-06 ENCOUNTER — Telehealth: Payer: Self-pay | Admitting: Pulmonary Disease

## 2016-07-06 NOTE — Telephone Encounter (Signed)
#   vials:6 Ordered date:07/05/16 Shipping Date:07/05/16

## 2016-07-06 NOTE — Telephone Encounter (Signed)
#   Vials:6 Arrival Date:07/06/16 Lot #:[id number] Exp Date:3/21  

## 2016-07-18 ENCOUNTER — Ambulatory Visit (INDEPENDENT_AMBULATORY_CARE_PROVIDER_SITE_OTHER): Payer: Medicare Other

## 2016-07-18 DIAGNOSIS — J4540 Moderate persistent asthma, uncomplicated: Secondary | ICD-10-CM | POA: Diagnosis not present

## 2016-07-19 DIAGNOSIS — J4540 Moderate persistent asthma, uncomplicated: Secondary | ICD-10-CM

## 2016-07-19 MED ORDER — OMALIZUMAB 150 MG ~~LOC~~ SOLR
375.0000 mg | SUBCUTANEOUS | Status: DC
  Administered 2016-07-19 – 2016-08-01 (×2): 375 mg via SUBCUTANEOUS

## 2016-08-01 ENCOUNTER — Ambulatory Visit (INDEPENDENT_AMBULATORY_CARE_PROVIDER_SITE_OTHER): Payer: Medicare Other

## 2016-08-01 DIAGNOSIS — J4550 Severe persistent asthma, uncomplicated: Secondary | ICD-10-CM | POA: Diagnosis not present

## 2016-08-01 DIAGNOSIS — J4540 Moderate persistent asthma, uncomplicated: Secondary | ICD-10-CM | POA: Diagnosis not present

## 2016-08-03 ENCOUNTER — Telehealth: Payer: Self-pay | Admitting: Pulmonary Disease

## 2016-08-03 NOTE — Telephone Encounter (Signed)
#   vials:6 Ordered date:08/03/16 Shipping Date:08/04/16

## 2016-08-04 NOTE — Telephone Encounter (Signed)
#   Vials:6 Arrival Date:08/04/16 Lot #[id number] Exp Date:4/21

## 2016-08-12 ENCOUNTER — Other Ambulatory Visit: Payer: Self-pay | Admitting: Cardiovascular Disease

## 2016-08-14 NOTE — Telephone Encounter (Signed)
Rx has been sent to the pharmacy electronically. ° °

## 2016-08-15 ENCOUNTER — Telehealth: Payer: Self-pay | Admitting: *Deleted

## 2016-08-15 ENCOUNTER — Ambulatory Visit (INDEPENDENT_AMBULATORY_CARE_PROVIDER_SITE_OTHER): Payer: Medicare Other

## 2016-08-15 DIAGNOSIS — J4520 Mild intermittent asthma, uncomplicated: Secondary | ICD-10-CM | POA: Diagnosis not present

## 2016-08-15 NOTE — Telephone Encounter (Signed)
Left message for patient to call and schedule appointment with Dr. Tresa EndoKelly for medication refills

## 2016-08-16 MED ORDER — OMALIZUMAB 150 MG ~~LOC~~ SOLR
375.0000 mg | SUBCUTANEOUS | Status: DC
  Administered 2016-08-15: 375 mg via SUBCUTANEOUS

## 2016-08-16 NOTE — Progress Notes (Signed)
[name] Lane came in on 08/16/2016 to receive a [name] injection. The patient is given 375mg  every 28 days. Due to each vial equalling 150mg , 75mg  of medication was wasted.

## 2016-08-17 ENCOUNTER — Ambulatory Visit: Payer: Medicare Other | Admitting: Cardiovascular Disease

## 2016-08-29 ENCOUNTER — Ambulatory Visit (INDEPENDENT_AMBULATORY_CARE_PROVIDER_SITE_OTHER): Payer: Medicare Other

## 2016-08-29 DIAGNOSIS — J4520 Mild intermittent asthma, uncomplicated: Secondary | ICD-10-CM

## 2016-08-30 MED ORDER — OMALIZUMAB 150 MG ~~LOC~~ SOLR
375.0000 mg | SUBCUTANEOUS | Status: DC
  Administered 2016-08-29 – 2016-09-12 (×2): 375 mg via SUBCUTANEOUS

## 2016-08-30 NOTE — Progress Notes (Signed)
[name] Lane came in on 08/29/16 to receive a [name] injection. The patient is given 375mg  every 28 days. Due to each vial equalling 150mg , 75mg  of medication was wasted.

## 2016-09-07 ENCOUNTER — Telehealth: Payer: Self-pay | Admitting: Pulmonary Disease

## 2016-09-07 NOTE — Telephone Encounter (Signed)
Vials: 6 Order Date: 09/07/16 Ship Date: 09/08/16

## 2016-09-08 NOTE — Telephone Encounter (Signed)
#   Vials:6 Arrival Date:09/08/16 Lot #[id number] Exp Date:4/21  

## 2016-09-12 ENCOUNTER — Ambulatory Visit (INDEPENDENT_AMBULATORY_CARE_PROVIDER_SITE_OTHER): Payer: Medicare Other

## 2016-09-12 DIAGNOSIS — J4520 Mild intermittent asthma, uncomplicated: Secondary | ICD-10-CM

## 2016-09-12 DIAGNOSIS — J4540 Moderate persistent asthma, uncomplicated: Secondary | ICD-10-CM

## 2016-09-13 MED ORDER — OMALIZUMAB 150 MG ~~LOC~~ SOLR: 375.0000 mg | Freq: Once | SUBCUTANEOUS | Status: DC

## 2016-09-13 NOTE — Progress Notes (Signed)
The patient is given 375mg  every 28 days. Due to each vial equalling 150mg , 15units of medication was wasted.

## 2016-09-26 ENCOUNTER — Ambulatory Visit (INDEPENDENT_AMBULATORY_CARE_PROVIDER_SITE_OTHER): Payer: Medicare Other

## 2016-09-26 DIAGNOSIS — J4540 Moderate persistent asthma, uncomplicated: Secondary | ICD-10-CM

## 2016-09-27 DIAGNOSIS — J4540 Moderate persistent asthma, uncomplicated: Secondary | ICD-10-CM

## 2016-09-27 MED ORDER — OMALIZUMAB 150 MG ~~LOC~~ SOLR
375.0000 mg | SUBCUTANEOUS | Status: DC
  Administered 2016-09-27: 375 mg via SUBCUTANEOUS

## 2016-10-03 ENCOUNTER — Encounter: Payer: Self-pay | Admitting: Cardiovascular Disease

## 2016-10-03 ENCOUNTER — Telehealth: Payer: Self-pay | Admitting: Pulmonary Disease

## 2016-10-03 ENCOUNTER — Ambulatory Visit (INDEPENDENT_AMBULATORY_CARE_PROVIDER_SITE_OTHER): Payer: Medicare Other | Admitting: Cardiovascular Disease

## 2016-10-03 VITALS — BP 144/76 | HR 81 | Ht 62.0 in | Wt 189.6 lb

## 2016-10-03 DIAGNOSIS — I2583 Coronary atherosclerosis due to lipid rich plaque: Secondary | ICD-10-CM | POA: Diagnosis not present

## 2016-10-03 DIAGNOSIS — G4733 Obstructive sleep apnea (adult) (pediatric): Secondary | ICD-10-CM | POA: Diagnosis not present

## 2016-10-03 DIAGNOSIS — I2510 Atherosclerotic heart disease of native coronary artery without angina pectoris: Secondary | ICD-10-CM | POA: Diagnosis not present

## 2016-10-03 DIAGNOSIS — I10 Essential (primary) hypertension: Secondary | ICD-10-CM

## 2016-10-03 DIAGNOSIS — J449 Chronic obstructive pulmonary disease, unspecified: Secondary | ICD-10-CM

## 2016-10-03 DIAGNOSIS — E782 Mixed hyperlipidemia: Secondary | ICD-10-CM

## 2016-10-03 DIAGNOSIS — E669 Obesity, unspecified: Secondary | ICD-10-CM | POA: Diagnosis not present

## 2016-10-03 NOTE — Telephone Encounter (Signed)
#   vials:6 Ordered date:11/24/15 Shipping Date:11/25/15 

## 2016-10-03 NOTE — Patient Instructions (Signed)
Your physician wants you to follow-up in: 1 year or sooner if needed. You will receive a reminder letter in the mail two months in advance. If you don't receive a letter, please call our office to schedule the follow-up appointment.   If you need a refill on your cardiac medications before your next appointment, please call your pharmacy.   

## 2016-10-04 NOTE — Telephone Encounter (Signed)
#   Vials:6 Arrival Date:10/04/16 Lot #[id number] Exp Date:5/21

## 2016-10-05 NOTE — Progress Notes (Signed)
Patient ID: [name] Lane, female   DOB: 11-09-1940, 75 y.o.   MRN: [id number]     HPI: [name] Lane is a 75 y.o. female who presents to the office today for a 13 month follow up cardiology evaluation.  Ms. Justus has a history of hypertension, hyperlipidemia, prior CABG with bare-metal stenting to her RCA and LAD in 2003, and PTCA for in-stent restenosis in 2003.  She was admitted to [facility] on 12/30/2013 with chest pain.  A nuclear study suggested distal anterolateral ischemia and she had mildly positive troponin.  Cardiac catheterization by Dr. Ellyn Hack showed severe 2 vessel CAD with 95% proximal diagonal stenosis which was felt to be the culprit lesion.  She also at 80% RCA stenosis.  She underwent single vessel intervention to the diagonal vessel initially and due to renal insufficiency underwent staged intervention to her proximal RCA with DES stent.  She was seen by Dr. Ellyn Hack on 02/12/2014 and had been on dual antiplatelet therapy.  She was rehospitalized with chest pain and was transferred to from [facility].  She underwent repeat cardiac catheterization and her stents were widely patent and the LAD, diagonal, and right coronary artery.  It was felt that her right posterior lateral 3 lesion was the culprit for chest pain, but due to small size medical therapy was initially recommended with plans for potential cutting balloon intervention if recurrent symptoms developed.  She was also noted to have mild aortic stenosis with a peak gradient of 24 and a mean gradient of 19.   When I saw her in followup in June 2015, I recommended further titration of her carvedilol to 12.5 mg twice a day which improved  her chest pain.  She does note occasional muscle spasms.  She does have a history of depression, for which he takes Prozac.  She denies PND, or orthopnea.  She denies palpitations.  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  She is  followed by Dr. Elsworth Soho for her COPD and asthma.  She is on CPAP at 12 cm water pressure.  She sees Dr. Estell Harpin at [facility] center in [place], [place].  She denies chest pressure.  She is unaware of palpitations.  She presents for evaluation   Past Medical History:  Diagnosis Date  . Anxiety   . CAD (coronary artery disease)    a. 2003 BMS to RCA & LAD; PTCA of ISR 2003 b.12/2013 - staged DES PCI of the D1 and mRCA. c. 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  . Carotid bruit   . CKD (chronic kidney disease), stage III    a. Stage III-IV.  Marland Kitchen COPD (chronic obstructive pulmonary disease) ([place])   . Edema   . GE reflux   . HLD (hyperlipidemia)   . Hypertension   . Mild aortic stenosis    a. By cath 04/09/2014.  Marland Kitchen Obstructive sleep apnea   . Retroperitoneal bleed    a. Hx of this on lovenox in 2001 (had been admitted for NSTEMI).  . Type 2 diabetes mellitus treated with insulin ([place])   . Unspecified asthma(493.90)     Past Surgical History:  Procedure Laterality Date  . APPENDECTOMY    . CARDIAC CATHETERIZATION  11/18/1999   Small non-STEMI; moderate 50-60% RCA lesion treated medically secondary to percutaneous RP bleed  . CARDIAC CATHETERIZATION  April 2004, October 2010, December  2011   Stable CAD with mild ISR in LAD and RCA; treated medically  . CARDIAC CATHETERIZATION  01/04/2012   4 small non-STEMI; Myoview with lateral ischemia-- 95% proximal D1, 80% mid RCA post stent -- staged PCI   . CORONARY ANGIOPLASTY   04/29/2002   Cutting Balloon PTCA of LAD stent for ISR with 2.5 mm balloon  . LEFT HEART CATHETERIZATION WITH CORONARY ANGIOGRAM N/A 01/02/2014   Procedure: LEFT HEART CATHETERIZATION WITH CORONARY ANGIOGRAM;  Surgeon: Leonie Man, MD;  Location[facility] C[facility] LAB;  Service: Cardiovascular;  Laterality: N/A;  . LEFT HEART CATHETERIZATION WITH CORONARY ANGIOGRAM N/A 04/09/2014   Procedure: LEFT HEART  CATHETERIZATION WITH CORONARY ANGIOGRAM;  Surgeon: Leonie Man, MD;  Location: [facility] CATH LAB;  Service: Cardiovascular;  Laterality: N/A;  . NM MYOVIEW LTD  01/01/2014   EF 64%. The distal anterior/anterolateral ischemia -- referred for cath  . PERCUTANEOUS CORONARY STENT INTERVENTION (PCI-S)  02/17/2002   Two-vessel PCI: 80% mid LAD -- 2.25 mm x 8 mm Express 2 BMS; in RCA RCA 80% 3.5 mm x 12 mm Express 2 BMS  . PERCUTANEOUS CORONARY STENT INTERVENTION (PCI-S)   03/07 & 06/2012   7th - Two-vessel PCI: 80% mid LAD -- 2.25 mm x 8 mm Express 2 BMS; in RCA RCA 80% 3.5 mm x 12 mm Express 2 BMS; 9th - mid RCA a Xience alpine DES 3.5 mm x 15 mm (postdilated 3.5 mm)   . PERCUTANEOUS CORONARY STENT INTERVENTION (PCI-S) N/A 01/06/2014   Procedure: PERCUTANEOUS CORONARY STENT INTERVENTION (PCI-S);  Surgeon: Leonie Man, MD;  Location: [facility] CATH LAB;  Service: Cardiovascular;  Laterality: N/A;  . TRANSTHORACIC ECHOCARDIOGRAM   01/02/2014   (Most recent) moderate concentric LVH. Normal EF of 65-70%. Grade 1 diastolic dysfunction. Mild AI    Allergies  Allergen Reactions  . Heparin Other (See Comments)    PATIENT ALMOST DIED FROM SIGNIFICANT BLEEDING EVENT, WAS BROUGHT TO [facility] ED AS A RESULT (2001)  . Morphine And Related Other (See Comments)    CAUSED SIGNIFICANT HYPOTENSION AND PATIENT CRASHED AND NEEDED INSTANT INTERVENTION  . Atorvastatin Other (See Comments)    REACTION: muscle aches  . Benzonatate Other (See Comments)    REACTION: confusion    Current Outpatient Prescriptions  Medication Sig Dispense Refill  . acetaminophen (TYLENOL) 325 MG tablet Take 325-650 mg by mouth every 6 (six) hours as needed for moderate pain.     Marland Kitchen albuterol (PROVENTIL HFA;VENTOLIN HFA) 108 (90 BASE) MCG/ACT inhaler Inhale 2 puffs into the lungs every 6 (six) hours as needed for wheezing or shortness of breath.    Marland Kitchen albuterol (PROVENTIL) (2.5 MG/3ML) 0.083% nebulizer solution Take 2.5 mg by nebulization every 6 (six)  hours as needed for wheezing or shortness of breath.    Marland Kitchen amLODipine (NORVASC) 5 MG tablet Take 5 mg by mouth daily.    Marland Kitchen aspirin 81 MG tablet Take 81 mg by mouth daily.      . brimonidine (ALPHAGAN) 0.2 % ophthalmic solution Place 2 drops into both eyes daily. 1 drop in the morning and 1 drop in the evening.  2  . budesonide (PULMICORT) 0.5 MG/2ML nebulizer solution Take 0.5 mg by nebulization 2 (two) times daily.    . cetirizine (ZYRTEC) 10 MG tablet Take 10 mg by mouth daily.      Marland Kitchen DALIRESP 500 MCG TABS tablet TAKE 1 TABLET BY MOUTH EVERY DAY 30 tablet 0  . enalapril (VASOTEC) 20 MG tablet Take 1  tablet by mouth daily.  3  . ezetimibe (ZETIA) 10 MG tablet Take 1 tablet (10 mg total) by mouth daily. 90 tablet 3  . fenofibrate (TRICOR) 145 MG tablet Take 1 tablet (145 mg total) by mouth daily. PLEASE CONTACT OFFICE FOR ADDITIONAL REFILLS 15 tablet 0  . FLUoxetine (PROZAC) 20 MG capsule Take 20 mg by mouth daily.  3  . furosemide (LASIX) 20 MG tablet Take 20-40 mg by mouth See admin instructions. TYPICALLY TAKES 1 TAB DAILY, BUT CAN TAKE ADDT'L TAB AS NEEDED FOR SHORTNESS OF BREATH, FLUID BUILDUP    . gentamicin (GARAMYCIN) 0.3 % ophthalmic ointment Place 1 application into the left eye 2 (two) times daily.    . insulin glargine (LANTUS) 100 UNIT/ML injection Inject 30 Units into the skin at bedtime.     . insulin lispro (HUMALOG) 100 UNIT/ML injection Inject 0-12 Units into the skin 3 (three) times daily with meals. If BG less than 150, no insulin given 151-200, takes 3 units 201-250, takes 5 units  251-300, takes 8 units 301-350, takes 10 units 351-400, takes 12 units 400+, call MD    . isosorbide mononitrate (IMDUR) 30 MG 24 hr tablet TAKE 1 TABLET(30 MG) BY MOUTH DAILY 90 tablet 0  . isosorbide mononitrate (IMDUR) 30 MG 24 hr tablet Take 1 tablet (30 mg total) by mouth daily. Need appointment before anymore refills 30 tablet 0  . LORazepam (ATIVAN) 1 MG tablet Take 1 mg by mouth 2 (two)  times daily as needed for anxiety or sleep.     . metoprolol tartrate (LOPRESSOR) 25 MG tablet Take 1 tablet (25 mg total) by mouth 2 (two) times daily. 180 tablet 3  . nitroGLYCERIN (NITROSTAT) 0.4 MG SL tablet Place 1 tablet (0.4 mg total) under the tongue every 5 (five) minutes as needed for chest pain. 25 tablet 6  . potassium chloride (K-DUR) 10 MEQ tablet TAKE 1 TABLET BY MOUTH EVERY DAY. TAKE ADDTIONAL 1 TABLET ON DAYS YOU TAKE EXTRA LASIX 30 tablet 11  . pravastatin (PRAVACHOL) 40 MG tablet Take 1 tablet (40 mg total) by mouth daily. (Patient taking differently: Take 40 mg by mouth every evening. ) 30 tablet 1  . prednisoLONE acetate (PRED FORTE) 1 % ophthalmic suspension Place 1 drop into both eyes 3 (three) times daily.  2  . predniSONE (DELTASONE) 5 MG tablet Take 5 mg by mouth daily with breakfast.    . ranitidine (ZANTAC) 150 MG tablet Take 150 mg by mouth 2 (two) times daily.     Marland Kitchen tetracaine (PONTOCAINE) 0.5 % ophthalmic solution Place 2 drops into the left eye every 6 (six) hours as needed (Pain). 2 mL 0  . ticagrelor (BRILINTA) 90 MG TABS tablet Take 1 tablet (90 mg total) by mouth 2 (two) times daily. NEED OV. 60 tablet 0  . tiotropium (SPIRIVA) 18 MCG inhalation capsule Place 1 capsule (18 mcg total) into inhaler and inhale daily. 30 capsule 1   Current Facility-Administered Medications  Medication Dose Route Frequency Provider Last Rate Last Dose  . omalizumab Arvid Right) injection 375 mg  375 mg Subcutaneous Q14 Days Rigoberto Noel, MD   375 mg at 05/23/16 1242  . omalizumab Arvid Right) injection 375 mg  375 mg Subcutaneous Q14 Days Rigoberto Noel, MD   375 mg at 06/06/16 1630  . omalizumab Arvid Right) injection 375 mg  375 mg Subcutaneous Q14 Days Rigoberto Noel, MD   375 mg at 07/04/16 1219  . omalizumab Arvid Right) injection 375  mg  375 mg Subcutaneous Q14 Days Rigoberto Noel, MD   375 mg at 08/01/16 1010  . omalizumab Arvid Right) injection 375 mg  375 mg Subcutaneous Q14 Days Rigoberto Noel,  MD   375 mg at 08/15/16 1138  . omalizumab Arvid Right) injection 375 mg  375 mg Subcutaneous Q14 Days Rigoberto Noel, MD   375 mg at 09/12/16 1242  . omalizumab Arvid Right) injection 375 mg  375 mg Subcutaneous Once Rigoberto Noel, MD      . omalizumab Arvid Right) injection 375 mg  375 mg Subcutaneous Q14 Days Rigoberto Noel, MD   375 mg at 09/27/16 1154    Social History   Social History  . Marital status: Married    Spouse name: N/A  . Number of children: N/A  . Years of education: N/A   Occupational History  . Retirede    Social History Main Topics  . Smoking status: Former Smoker    Quit date: 10/31/1999  . Smokeless tobacco: Never Used  . Alcohol use No  . Drug use: No  . Sexual activity: Not on file   Other Topics Concern  . Not on file   Social History Narrative   She is a single mother of 7 (no note of being added divorced or without) 10 grandchildren and 15 great-grandchildren. She lives with her son. Either one of 2 sons or the daughter come with her to appointments. They carry a notebook complete all her medical data. She does all her activities of daily living. She is a former smoker smoked 20 packs a day for 30 years and quit in 2001. She did not drink alcohol. He does not get routine exercise due to dyspnea.   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    Family History  Problem Relation Age of Onset  . Breast cancer Mother   . Heart disease      ROS General: Negative; No fevers, chills, or night sweats HEENT: Positive for left eye blindness; No changes in  hearing, sinus congestion, difficulty swallowing Pulmonary: positive for occasional wheezing/COPD Cardiovascular: See HPI: No chest pain, presyncope, syncope, palpitations Positive for hyperlipidemia GI: Negative; No nausea, vomiting, diarrhea, or abdominal pain GU: Negative; No dysuria, hematuria, or difficulty voiding Musculoskeletal: Negative; no myalgias, joint pain, or weakness Hematologic: Negative; no easy  bruising, bleeding Endocrine: Negative; no heat/cold intolerance; no diabetes, Neuro: Negative; no changes in balance, headaches Skin: Negative; No rashes or skin lesions Psychiatric: Positive for depression No behavioral problems, Sleep: Negative; No snoring,  daytime sleepiness, hypersomnolence, bruxism, restless legs, hypnogognic hallucinations. Other comprehensive 14 point system review is negative   Physical Exam BP (!) 144/76   Pulse 81   Ht _0  (1.575 m)   Wt 189 lb 9.6 oz (86 kg)   BMI 34.68 kg/m    Repeat blood pressure by me was 136/74.  Wt Readings from Last 3 Encounters:  10/03/16 189 lb 9.6 oz (86 kg)  03/21/16 189 lb (85.7 kg)  09/03/15 193 lb 8 oz (87.8 kg)   General: Alert, oriented, no distress.  Skin: normal turgor, no rashes, warm and dry HEENT: Normocephalic, atraumatic. Pupils equal round and reactive to light; sclera anicteric; extraocular muscles intact, No lid lag; Nose without nasal septal hypertrophy; Mouth/Parynx benign; Mallinpatti scale 3 Neck: No JVD, no carotid bruits; normal carotid upstroke Lungs: clear to ausculatation and percussion bilaterally; no wheezing or rales, normal inspiratory and expiratory effort Chest wall: without tenderness to palpitation Heart: PMI  not displaced, RRR, s1 s2 normal, 1/6 systolic murmur, No diastolic murmur, no rubs, gallops, thrills, or heaves Abdomen: soft, nontender; no hepatosplenomehaly, BS+; abdominal aorta nontender and not dilated by palpation. Back: no CVA tenderness Pulses: 2+  Musculoskeletal: full range of motion, normal strength, no joint deformities Extremities: Pulses 2+, no clubbing cyanosis or edema, Homan's sign negative  Neurologic: grossly nonfocal; Cranial nerves grossly wnl Psychologic: Normal mood and affect  ECG (independently read by me): Normal sinus rhythm at 81 bpm.  Nonspecific ST-T changes.  November 2016 ECG (independently read by me):  Normal sinus rhythm at 72 bpm.  Nonspecific  T changes.  September 2015 ECG (independently read by me): Normal sinus rhythm at 71 beats per minute.  Nonspecific ST changes.  04/20/2014 ECG (independently read by me): Normal sinus rhythm at 85 beats per minute.  Nonspecific ST changes.  LABS:  BMP Latest Ref Rng & Units 06/01/2015 05/31/2015 05/30/2015  Glucose 65 - 99 mg/dL 128(H) 187(H) 177(H)  BUN 6 - 20 mg/dL 14 24(H) 29(H)  Creatinine 0.44 - 1.00 mg/dL 1.62(H) 2.06(H) 2.65(H)  Sodium 135 - 145 mmol/L 139 137 137  Potassium 3.5 - 5.1 mmol/L 3.7 3.6 4.1  Chloride 101 - 111 mmol/L 110 109 104  CO2 22 - 32 mmol/L 21(L) 23 19(L)  Calcium 8.9 - 10.3 mg/dL 9.0 8.6(L) 9.0   Hepatic Function Latest Ref Rng & Units 05/31/2015 02/08/2015 11/03/2014  Total Protein 6.5 - 8.1 g/dL 6.0(L) 7.3 7.0  Albumin 3.5 - 5.0 g/dL 3.4(L) 4.3 4.1  AST 15 - 41 U/L _0 ALT 14 - 54 U/L _1 Alk Phosphatase 38 - 126 U/L 31(L) 42 63  Total Bilirubin 0.3 - 1.2 mg/dL 0.6 0.6 0.5  Bilirubin, Direct 0.0 - 0.3 mg/dL - - -   CBC Latest Ref Rng & Units 06/01/2015 05/31/2015 05/30/2015  WBC 4.0 - 10.5 K/uL 8.6 7.7 8.9  Hemoglobin 12.0 - 15.0 g/dL 9.5(L) 9.3(L) 10.1(L)  Hematocrit 36.0 - 46.0 % 30.5(L) 29.1(L) 30.7(L)  Platelets 150 - 400 K/uL 313 292 333   Lab Results  Component Value Date   MCV 78.4 06/01/2015   MCV 78.4 05/31/2015   MCV 77.1 (L) 05/30/2015   Lab Results  Component Value Date   TSH 0.255 (L) 12/31/2013   Lab Results  Component Value Date   HGBA1C 9.0 (H) 01/01/2014   Lipid Panel     Component Value Date/Time   CHOL 186 02/08/2015 1345   TRIG 205 (H) 02/08/2015 1345   HDL 47 02/08/2015 1345   CHOLHDL 4.0 02/08/2015 1345   VLDL 41 (H) 02/08/2015 1345   LDLCALC 98 02/08/2015 1345   LDLDIRECT 136.1 11/02/2010 1300   RADIOLOGY: Dg Ribs Unilateral W/chest Right  04/08/2014   CLINICAL DATA:  Fall, right rib pain  EXAM: RIGHT RIBS AND CHEST - 3+ VIEW  COMPARISON:  04/07/2014  FINDINGS: Three views right ribs submitted. No acute  infiltrate or pulmonary edema. No rib fracture. No pneumothorax.  IMPRESSION: Negative.   Electronically Signed   By: Lahoma Crocker M.D.   On: 04/08/2014 09:13      ASSESSMENT AND PLAN: Ms.Trivedi is a 75 year old female, who is  status post prior intervention to her LAD and RCA with bare-metal stents.  She developed unstable angina symptoms in March 2015 and underwent staged DES PCI of the diagonal vessel and mid RCA. Due to recurrent chest pain repeat catheterization revealed previous stented segments to be patent.  However,  she did have progressive disease in a a small third right posterolateral branch for which initial medical therapy was recommended.  Her blood pressure today is stable and on repeat BP was 136/74 on her medical regimen consisting of  amlodipine 5 mg, atenolol 12.5 twice a day, enalapril 20 mg daily, isosorbide 30 mg, and furosemide 20 mg.  When I saw her last year, I changed carvedilol to metoprolol since she was having some wheezing on carvedilol for improved cardio selectivity.  She is on pravastatin 40 mg, fenofibrate, and Zetia for hyperlipidemia and is tolerating this well.  Target LDL is less than 70.   If she cannot get to target, pravastatin should be changed to either atorvastatin or rosuvastatin. She continues to be on dual antiplatelet therapy with aspirin and Plavix and denies bleeding.  Her BMI is 34.68 and is consistent with moderate obesity.  Weight loss was recommended.  Her primary physician will be rechecking blood work and will last that they be sent to me for my review.  She is not having any anginal symptomatology on her current medical regimen.  I will see her in one year for cardiology reevaluation.  Time spent: 25 minutes  Troy Sine, MD, [facility]  10/05/2016 6:18 PM

## 2016-10-10 ENCOUNTER — Ambulatory Visit (INDEPENDENT_AMBULATORY_CARE_PROVIDER_SITE_OTHER): Payer: Medicare Other

## 2016-10-10 DIAGNOSIS — J4540 Moderate persistent asthma, uncomplicated: Secondary | ICD-10-CM

## 2016-10-11 MED ORDER — OMALIZUMAB 150 MG ~~LOC~~ SOLR
375.0000 mg | SUBCUTANEOUS | Status: DC
  Administered 2016-10-10: 375 mg via SUBCUTANEOUS

## 2016-10-11 NOTE — Progress Notes (Signed)
patient came in on 10/10/2016 to receive a [name] injection. The patient is given 375 mg every 14 days. Due to each vial equalling 150mg , 75 mg of medication was wasted.

## 2016-10-17 ENCOUNTER — Encounter: Payer: Self-pay | Admitting: Pulmonary Disease

## 2016-10-17 ENCOUNTER — Ambulatory Visit (INDEPENDENT_AMBULATORY_CARE_PROVIDER_SITE_OTHER): Payer: Medicare Other | Admitting: Pulmonary Disease

## 2016-10-17 DIAGNOSIS — Z23 Encounter for immunization: Secondary | ICD-10-CM

## 2016-10-17 DIAGNOSIS — J4551 Severe persistent asthma with (acute) exacerbation: Secondary | ICD-10-CM | POA: Diagnosis not present

## 2016-10-17 DIAGNOSIS — I2510 Atherosclerotic heart disease of native coronary artery without angina pectoris: Secondary | ICD-10-CM

## 2016-10-17 DIAGNOSIS — I2583 Coronary atherosclerosis due to lipid rich plaque: Secondary | ICD-10-CM | POA: Diagnosis not present

## 2016-10-17 DIAGNOSIS — J449 Chronic obstructive pulmonary disease, unspecified: Secondary | ICD-10-CM

## 2016-10-17 NOTE — Patient Instructions (Addendum)
Flu shot today  In the future, we will try to cut down some of your medications

## 2016-10-17 NOTE — Assessment & Plan Note (Signed)
Continue prednisone 5 mg and [name] every 2 weeks  Continue Pulmicort and albuterol nebs as needed   Flu shot today

## 2016-10-17 NOTE — Assessment & Plan Note (Signed)
Not sure that [name] Lane is doing much here but she feels subjectively better with this and so continue for now but consider decreasing in 6 months  Continue Spiriva

## 2016-10-17 NOTE — Progress Notes (Signed)
   Subjective:    Patient ID: [name] Lane, female    DOB: 04/25/1941, 75 y.o.   MRN: [id number]  HPI  75/F, ex- smoker, quit 2001 for FU of COPD/asthma,  She has been steroid dependent since 2011, lowest 5 mg since 3/13  She had upper airway pseudo wheeze which improves on calming down  Worked in a hosiery mill x 12y. Triggers being activity, perfumes, spring allergies (claritin helps) , cold weather etc.  She has severe anxiety & agoraphobia, now on ativan  ON spiriva since june 2011with improved exercise tolerance  She has done remarkably well on xolair & 5mg  prednisone since jan 2014 - reduced hospitalisations   Has a dog & bird- cockatoo, clean cage.   10/17/2016  Chief Complaint  Patient presents with  . Follow-up    6 month follow up. Breathing has ok. Has noticed a small amount of chest tightness.     Accompanied by Daughter,  Currently on pulmicort neb Twice daily , spiriva daily and [name]  q 2 weeks . Remains on daily prednisone 5mg  daily  & daliresp   In the past we tried to decrease [name] to every 4 weeks but her symptoms would get worse Daughter reports increased nasal bleeds  Overall, her wheezing is much improved, she is able to function independently and lives alone, she uses nebs on occasion She feels that Daliresp has helped  She reports compliance with CPAP and denies problems with mask or pressure  Significant tests/ events Spirometry >> FEv1 48 % c/w severe airway obstruction.  RAST - IGE 636, +house dust, mildly pos for cat & dog dander  PSG -Moderate OSA - on CPAP 12 cm  02/2011 >> started daliresp   12/2013  nstemi, s/p PCI to diag & RCA  03/2014  cath w/ patent stents .    Review of Systems neg for any significant sore throat, dysphagia, itching, sneezing, nasal congestion or excess/ purulent secretions, fever, chills, sweats, unintended wt loss, pleuritic or exertional cp, hempoptysis, orthopnea pnd or change in chronic leg swelling. Also  denies presyncope, palpitations, heartburn, abdominal pain, nausea, vomiting, diarrhea or change in bowel or urinary habits, dysuria,hematuria, rash, arthralgias, visual complaints, headache, numbness weakness or ataxia.     Objective:   Physical Exam  Gen. Pleasant, well-nourished, in no distress ENT - no lesions, no post nasal drip Neck: No JVD, no thyromegaly, no carotid bruits Lungs: no use of accessory muscles, no dullness to percussion, clear without rales or rhonchi  Cardiovascular: Rhythm regular, heart sounds  normal, no murmurs or gallops, no peripheral edema Musculoskeletal: No deformities, no cyanosis or clubbing        Assessment & Plan:

## 2016-10-24 ENCOUNTER — Ambulatory Visit (INDEPENDENT_AMBULATORY_CARE_PROVIDER_SITE_OTHER): Payer: Medicare Other

## 2016-10-24 DIAGNOSIS — J4520 Mild intermittent asthma, uncomplicated: Secondary | ICD-10-CM

## 2016-10-25 MED ORDER — OMALIZUMAB 150 MG ~~LOC~~ SOLR
150.0000 mg | SUBCUTANEOUS | Status: DC
  Administered 2016-10-24: 375 mg via SUBCUTANEOUS

## 2016-10-25 NOTE — Progress Notes (Signed)
[name] Lane came in on 10/24/2016 to receive a [name] injection. The patient is given 375mg  every 28 days. Due to each vial equalling 150mg , 75mg  of medication was wasted.

## 2016-11-02 ENCOUNTER — Telehealth: Payer: Self-pay | Admitting: Pulmonary Disease

## 2016-11-03 NOTE — Telephone Encounter (Signed)
#   vials:6 Ordered date:11/02/16 Shipping Date:11/03/16 forgot to put it in.

## 2016-11-03 NOTE — Telephone Encounter (Signed)
#   Vials:6 Arrival Date:11/03/16 Lot #:[id number] Exp Date:8/21

## 2016-11-07 ENCOUNTER — Ambulatory Visit (INDEPENDENT_AMBULATORY_CARE_PROVIDER_SITE_OTHER): Payer: Medicare Other

## 2016-11-07 DIAGNOSIS — J4520 Mild intermittent asthma, uncomplicated: Secondary | ICD-10-CM | POA: Diagnosis not present

## 2016-11-08 MED ORDER — OMALIZUMAB 150 MG ~~LOC~~ SOLR
375.0000 mg | SUBCUTANEOUS | Status: DC
  Administered 2016-11-07: 375 mg via SUBCUTANEOUS

## 2016-11-08 NOTE — Progress Notes (Signed)
[name] RuaCarolyn Fordham came in on 11/07/2016 to receive a [name] injection. The patient is given 375mg  every 28 days. Due to each vial equalling 150mg , 75mg  of medication was wasted.

## 2016-11-21 ENCOUNTER — Ambulatory Visit (INDEPENDENT_AMBULATORY_CARE_PROVIDER_SITE_OTHER): Payer: Medicare Other

## 2016-11-21 DIAGNOSIS — J4520 Mild intermittent asthma, uncomplicated: Secondary | ICD-10-CM | POA: Diagnosis not present

## 2016-11-21 MED ORDER — OMALIZUMAB 150 MG ~~LOC~~ SOLR
375.0000 mg | SUBCUTANEOUS | Status: DC
  Administered 2016-11-21: 375 mg via SUBCUTANEOUS

## 2016-11-21 NOTE — Progress Notes (Signed)
Documentation and charges have been completed by Jaynee EaglesLindsay Lemons, CMA based on the [name] [facility].   Vena RuaCarolyn Hollinshead came in on 11/21/2016 to receive a [name] injection. The patient is given 375mg  every 28 days. Due to each vial equalling 150mg , 75mg  of medication was wasted.

## 2016-11-29 ENCOUNTER — Telehealth: Payer: Self-pay | Admitting: Pulmonary Disease

## 2016-11-29 NOTE — Telephone Encounter (Signed)
#   vials:6 Ordered date:11/29/16 Shipping Date:11/29/16

## 2016-11-30 NOTE — Telephone Encounter (Signed)
#   Vials:6 Arrival Date:11/30/16 Lot #:[id number] Exp Date:8/21

## 2016-12-05 ENCOUNTER — Ambulatory Visit (INDEPENDENT_AMBULATORY_CARE_PROVIDER_SITE_OTHER): Payer: Medicare Other

## 2016-12-05 DIAGNOSIS — J4520 Mild intermittent asthma, uncomplicated: Secondary | ICD-10-CM | POA: Diagnosis not present

## 2016-12-06 MED ORDER — OMALIZUMAB 150 MG ~~LOC~~ SOLR
375.0000 mg | SUBCUTANEOUS | Status: DC
  Administered 2016-12-05: 375 mg via SUBCUTANEOUS

## 2016-12-06 NOTE — Progress Notes (Signed)
Documentation of medication administration of [name] and charges have been completed by [name] Lane, [name] Lane based on the [name] documentation sheet completed by [name] Lane.   [name] Lane came in on 12/05/2016 to receive a [name] injection. The patient is given 375mg  every 28 days. Due to each vial equalling 150mg , 75mg  of medication was wasted.

## 2016-12-19 ENCOUNTER — Ambulatory Visit (INDEPENDENT_AMBULATORY_CARE_PROVIDER_SITE_OTHER): Payer: Medicare Other

## 2016-12-19 DIAGNOSIS — J4520 Mild intermittent asthma, uncomplicated: Secondary | ICD-10-CM | POA: Diagnosis not present

## 2016-12-20 MED ORDER — OMALIZUMAB 150 MG ~~LOC~~ SOLR
375.0000 mg | SUBCUTANEOUS | Status: DC
  Administered 2016-12-19: 375 mg via SUBCUTANEOUS

## 2016-12-20 NOTE — Progress Notes (Signed)
Documentation of medication administration of [name] and charges have been completed by Jaynee EaglesLindsay Lauri Till, CMA based on the [name] documentation sheet [facility].   [name] Lane came in on 12/19/16 to receive a [name] injection. The patient is given 375mg  every 28 days. Due to each vial equalling 150mg , 75mg  of medication was wasted.

## 2016-12-25 ENCOUNTER — Telehealth: Payer: Self-pay | Admitting: Pulmonary Disease

## 2016-12-25 NOTE — Telephone Encounter (Signed)
#   vials:6 Ordered date:12/25/16 Shipping Date:12/25/16 

## 2016-12-26 NOTE — Telephone Encounter (Signed)
#   Vials:6 Arrival Date:12/26/16 Lot #:[id number] Exp Date:9/21  

## 2017-01-02 ENCOUNTER — Ambulatory Visit (INDEPENDENT_AMBULATORY_CARE_PROVIDER_SITE_OTHER): Payer: Medicare Other

## 2017-01-02 DIAGNOSIS — J4540 Moderate persistent asthma, uncomplicated: Secondary | ICD-10-CM | POA: Diagnosis not present

## 2017-01-03 MED ORDER — OMALIZUMAB 150 MG ~~LOC~~ SOLR
375.0000 mg | SUBCUTANEOUS | Status: DC
  Administered 2017-01-02: 375 mg via SUBCUTANEOUS

## 2017-01-03 NOTE — Progress Notes (Signed)
Patient came in on 01/02/2017 to receive a [name] injection. The patient is given 375 mg every 14 days. Due to each vial equalling 150mg , 75mg  of medication was wasted.  [name] injection and charges documented by Dorethea ClanAshley Caulfield, RMA, based on injection sheet [facility].

## 2017-01-16 ENCOUNTER — Ambulatory Visit (INDEPENDENT_AMBULATORY_CARE_PROVIDER_SITE_OTHER): Payer: Medicare Other

## 2017-01-16 DIAGNOSIS — J4530 Mild persistent asthma, uncomplicated: Secondary | ICD-10-CM | POA: Diagnosis not present

## 2017-01-17 MED ORDER — OMALIZUMAB 150 MG ~~LOC~~ SOLR
375.0000 mg | Freq: Once | SUBCUTANEOUS | Status: AC
  Administered 2017-01-16: 375 mg via SUBCUTANEOUS

## 2017-01-17 NOTE — Progress Notes (Signed)
Mrs. [name] Lane came in on 01/16/17 to receive a [name] injection. The patient is given 375mg  every 14 days. Due to each vial equalling 150mg , 15units of medication was wasted.

## 2017-01-22 ENCOUNTER — Telehealth: Payer: Self-pay | Admitting: Pulmonary Disease

## 2017-01-22 NOTE — Telephone Encounter (Signed)
#   vials:6 Ordered date: 01/22/17 Shipping Date:01/22/17  # Vials:6 Arrival Date:01/23/17 Lot #[id number]#:[id number] Exp Date:9/21

## 2017-01-30 ENCOUNTER — Ambulatory Visit (INDEPENDENT_AMBULATORY_CARE_PROVIDER_SITE_OTHER): Payer: Medicare Other

## 2017-01-30 DIAGNOSIS — J4540 Moderate persistent asthma, uncomplicated: Secondary | ICD-10-CM

## 2017-01-31 MED ORDER — OMALIZUMAB 150 MG ~~LOC~~ SOLR
375.0000 mg | SUBCUTANEOUS | Status: DC
Start: 2017-01-31 — End: 2017-02-15
  Administered 2017-01-30: 375 mg via SUBCUTANEOUS

## 2017-01-31 NOTE — Progress Notes (Signed)
[name] injection documentation and charges entered by Dorethea Clan, RMA, based on injection sheet filled out by Dimas Millin per office protocol.   patient came in on 01/30/2017 to receive a [name] injection. The patient is given  every 14 days. Due to each vial equalling ,  of medication was wasted.

## 2017-02-13 ENCOUNTER — Ambulatory Visit (INDEPENDENT_AMBULATORY_CARE_PROVIDER_SITE_OTHER): Payer: Medicare Other

## 2017-02-13 DIAGNOSIS — J4540 Moderate persistent asthma, uncomplicated: Secondary | ICD-10-CM

## 2017-02-15 MED ORDER — OMALIZUMAB 150 MG ~~LOC~~ SOLR
375.0000 mg | Freq: Once | SUBCUTANEOUS | Status: AC
  Administered 2017-02-13: 375 mg via SUBCUTANEOUS

## 2017-02-15 NOTE — Progress Notes (Signed)
[name] Lane came in on 4.17.18 to receive a [name] injection. The patient is given  every 14 days. Due to each vial equalling ,  of medication was wasted.

## 2017-02-20 ENCOUNTER — Telehealth: Payer: Self-pay | Admitting: Pulmonary Disease

## 2017-02-21 NOTE — Telephone Encounter (Signed)
#   Vials:6 Arrival Date:02/21/17 Lot #[id number] Exp Date:9/21

## 2017-02-21 NOTE — Telephone Encounter (Signed)
#   vials:6 Ordered date:02/20/17 Shipping Date:02/20/17 Forgot to put order in epic.

## 2017-02-27 ENCOUNTER — Ambulatory Visit (INDEPENDENT_AMBULATORY_CARE_PROVIDER_SITE_OTHER): Payer: Medicare Other

## 2017-02-27 DIAGNOSIS — J4540 Moderate persistent asthma, uncomplicated: Secondary | ICD-10-CM | POA: Diagnosis not present

## 2017-02-28 MED ORDER — OMALIZUMAB 150 MG ~~LOC~~ SOLR
375.0000 mg | SUBCUTANEOUS | Status: DC
  Administered 2017-02-27: 375 mg via SUBCUTANEOUS

## 2017-02-28 NOTE — Progress Notes (Signed)
Patient came in on 02/27/2017 to receive a [name] injection. The patient is given  every 14 days. Due to each vial equalling ,  of medication was wasted.   [name] injection documentation and charges entered by Dorethea Clan, RMA, based on injection sheet filled out by Dimas Millin per office protocol.

## 2017-03-13 ENCOUNTER — Ambulatory Visit (INDEPENDENT_AMBULATORY_CARE_PROVIDER_SITE_OTHER): Payer: Medicare Other

## 2017-03-13 DIAGNOSIS — J4540 Moderate persistent asthma, uncomplicated: Secondary | ICD-10-CM

## 2017-03-14 DIAGNOSIS — J4540 Moderate persistent asthma, uncomplicated: Secondary | ICD-10-CM

## 2017-03-14 MED ORDER — OMALIZUMAB 150 MG ~~LOC~~ SOLR
375.0000 mg | SUBCUTANEOUS | Status: DC
  Administered 2017-03-14: 375 mg via SUBCUTANEOUS

## 2017-03-14 NOTE — Progress Notes (Signed)
[name] injection documentation and charges entered by Dorethea ClanAshley Caulfield, RMA, based on injection sheet filled out by [name] Millinammy Lane per office protocol.   patient came in on 03/13/2017 to receive a [name] injection. The patient is given 375mg  every 14 days. Due to each vial equalling 150mg , 75mg  of medication was wasted.

## 2017-03-21 ENCOUNTER — Telehealth: Payer: Self-pay | Admitting: Pulmonary Disease

## 2017-03-21 NOTE — Telephone Encounter (Addendum)
#   vials:6 Ordered date:03/21/17 Shipping Date:03/21/17

## 2017-03-22 NOTE — Telephone Encounter (Signed)
#   Vials:6 Arrival Date:03/22/17 Lot #[id number]#:[id number] Exp Date:10/21

## 2017-03-27 ENCOUNTER — Ambulatory Visit (INDEPENDENT_AMBULATORY_CARE_PROVIDER_SITE_OTHER): Payer: Medicare Other

## 2017-03-27 DIAGNOSIS — J4540 Moderate persistent asthma, uncomplicated: Secondary | ICD-10-CM | POA: Diagnosis not present

## 2017-03-28 MED ORDER — OMALIZUMAB 150 MG ~~LOC~~ SOLR
375.0000 mg | SUBCUTANEOUS | Status: DC
  Administered 2017-03-27: 375 mg via SUBCUTANEOUS

## 2017-03-28 NOTE — Progress Notes (Signed)
[name] injection documentation and charges entered by Suly Vukelich, RMA, based on injection sheet filled out by Tammy Scott per office protocol.   patient came in on 03/27/2017 to receive a [name] injection. The patient is given 375mg every 14 days. Due to each vial equalling 150mg, 75mg of medication was wasted.  

## 2017-04-03 ENCOUNTER — Encounter: Payer: Self-pay | Admitting: Pulmonary Disease

## 2017-04-03 ENCOUNTER — Ambulatory Visit (INDEPENDENT_AMBULATORY_CARE_PROVIDER_SITE_OTHER): Payer: Medicare Other | Admitting: Pulmonary Disease

## 2017-04-03 VITALS — BP 116/62 | HR 75 | Ht 62.0 in | Wt 160.0 lb

## 2017-04-03 DIAGNOSIS — J449 Chronic obstructive pulmonary disease, unspecified: Secondary | ICD-10-CM | POA: Diagnosis not present

## 2017-04-03 DIAGNOSIS — I10 Essential (primary) hypertension: Secondary | ICD-10-CM | POA: Diagnosis not present

## 2017-04-03 DIAGNOSIS — J4551 Severe persistent asthma with (acute) exacerbation: Secondary | ICD-10-CM

## 2017-04-03 NOTE — Patient Instructions (Addendum)
Discuss with cardiology about changing from enalapril to alternating medication- this may be causing her cough/throat irritation  Decrease prednisone to 5 mg on Monday/Wednesday/Friday  We can trial combination of breathing medication next visit

## 2017-04-03 NOTE — Assessment & Plan Note (Signed)
Decrease prednisone to 5 mg on Monday/Wednesday/Friday  We will attempt to taper off completely  In the future, we will trial combination LAMA/LABA or trelegy -she was hesitant for me to make too many changes today

## 2017-04-03 NOTE — Assessment & Plan Note (Signed)
Discuss with cardiology about changing from enalapril to alternating medication- this may be causing her cough/throat irritation

## 2017-04-03 NOTE — Progress Notes (Signed)
   Subjective:    Patient ID: [name] Lane, female    DOB: 07/07/1941, 76 y.o.   MRN: [id number]  HPI  75/F, ex- smoker, quit 2001 for FU of COPD/asthma,  She has been steroid dependent since 2011, lowest 5 mg since 12/2011  She had upper airway pseudo wheeze which improves on calming down  Worked in a hosiery mill x 12y. Triggers being activity, perfumes, spring allergies (claritin helps) , cold weather etc.  She has severe anxiety & agoraphobia,  on ativan  ON spiriva since june 2011with improved exercise tolerance       Has a dog &bird- cockatoo, clean cage.    Six-month follow-up today She has done remarkably well on xolair &5mg  prednisone since jan 2014 - reduced hospitalisations   Accompanied by Daughter,  Currently on pulmicort neb Twice daily , spiriva daily and [name]  q 2 weeks . Remains on daily prednisone 5mg  daily &daliresp   In the past, she has not tolerated decrease in frequency of [name] She continues to have severe anxiety and agoraphobia Her son who is a paramedic has now moved to [place] and she is thinking about moving in with him She complains of constant throat irritation and a dry cough, medication review shows enalapril She is compliant with CPAP and denies any problems with mask or pressure  Significant tests/ events Spirometry  03/2017 >>FEv1 56 % , improved from 48% in the past  RAST - IGE 636, +house dust, mildly pos for cat &dog dander  PSG -Moderate OSA - on CPAP 12 cm  02/2011 >>started daliresp     03/2014 cath w/ patent stents .    Review of Systems neg for any significant sore throat, dysphagia, itching, sneezing, nasal congestion or excess/ purulent secretions, fever, chills, sweats, unintended wt loss, pleuritic or exertional cp, hempoptysis, orthopnea pnd or change in chronic leg swelling.   Also denies presyncope, palpitations, heartburn, abdominal pain, nausea, vomiting, diarrhea or change in bowel or urinary habits,  dysuria,hematuria, rash, arthralgias, visual complaints, headache, numbness weakness or ataxia.     Objective:   Physical Exam  Gen. Pleasant, obese, in no distress ENT - no lesions, no post nasal drip Neck: No JVD, no thyromegaly, no carotid bruits Lungs: no use of accessory muscles, no dullness to percussion, decreased without rales or rhonchi  Cardiovascular: Rhythm regular, heart sounds  normal, no murmurs or gallops, no peripheral edema Musculoskeletal: No deformities, no cyanosis or clubbing , no tremors Skin- ecchymoses +       Assessment & Plan:

## 2017-04-03 NOTE — Assessment & Plan Note (Signed)
Ct xolair

## 2017-04-10 ENCOUNTER — Ambulatory Visit (INDEPENDENT_AMBULATORY_CARE_PROVIDER_SITE_OTHER): Payer: Medicare Other

## 2017-04-10 DIAGNOSIS — J4520 Mild intermittent asthma, uncomplicated: Secondary | ICD-10-CM

## 2017-04-11 MED ORDER — OMALIZUMAB 150 MG ~~LOC~~ SOLR
375.0000 mg | SUBCUTANEOUS | Status: DC
  Administered 2017-04-10: 375 mg via SUBCUTANEOUS

## 2017-04-11 NOTE — Progress Notes (Signed)
Documentation of medication administration of [name] and charges have been completed by Hildred Pharo, CMA based on the [name] documentation sheet completed by Tammy Scott.  

## 2017-04-16 ENCOUNTER — Telehealth: Payer: Self-pay | Admitting: Pulmonary Disease

## 2017-04-16 NOTE — Telephone Encounter (Signed)
#   vials:6 Ordered date:04/16/17 Shipping Date:04/16/17 

## 2017-04-17 NOTE — Telephone Encounter (Signed)
#   Vials:6 Arrival Date:04/17/17 Lot #[id number]#:[id number] Exp Date:11/21

## 2017-04-24 ENCOUNTER — Ambulatory Visit (INDEPENDENT_AMBULATORY_CARE_PROVIDER_SITE_OTHER): Payer: Medicare Other

## 2017-04-24 DIAGNOSIS — J4540 Moderate persistent asthma, uncomplicated: Secondary | ICD-10-CM | POA: Diagnosis not present

## 2017-04-25 MED ORDER — OMALIZUMAB 150 MG ~~LOC~~ SOLR
375.0000 mg | Freq: Once | SUBCUTANEOUS | Status: AC
  Administered 2017-04-24: 375 mg via SUBCUTANEOUS

## 2017-04-25 NOTE — Progress Notes (Signed)
Mr. [name] Lane came in on 04/24/17 to receive a [name] injection. The patient is given 375mg  every 14 days. Due to each vial equalling 150mg , 15mg  of medication was wasted.

## 2017-05-08 ENCOUNTER — Ambulatory Visit (INDEPENDENT_AMBULATORY_CARE_PROVIDER_SITE_OTHER): Payer: Medicare Other

## 2017-05-08 DIAGNOSIS — J4520 Mild intermittent asthma, uncomplicated: Secondary | ICD-10-CM | POA: Diagnosis not present

## 2017-05-09 MED ORDER — OMALIZUMAB 150 MG ~~LOC~~ SOLR
375.0000 mg | SUBCUTANEOUS | Status: DC
  Administered 2017-05-08: 375 mg via SUBCUTANEOUS

## 2017-05-09 NOTE — Progress Notes (Signed)
Documentation of medication administration and charges of [name] have been completed by [name] Lane, CMA based on the [name] [facility] sheet [facility].   [name] Lane came in on 05/08/2017 to receive a [name] injection. The patient is given 375mg  every 14 days. Due to each vial equalling 150mg , 75mg  of medication was wasted.

## 2017-05-16 ENCOUNTER — Telehealth: Payer: Self-pay | Admitting: Pulmonary Disease

## 2017-05-17 NOTE — Telephone Encounter (Signed)
#   Vials:6 Arrival Date:05/17/17 Lot #[id number]#:[id number] Exp Date:12/21

## 2017-05-17 NOTE — Telephone Encounter (Signed)
#   vials:6 Ordered date:05/16/17 Shipping Date:05/16/17 forgot to put order in.

## 2017-05-22 ENCOUNTER — Ambulatory Visit (INDEPENDENT_AMBULATORY_CARE_PROVIDER_SITE_OTHER): Payer: Medicare Other

## 2017-05-22 DIAGNOSIS — J4540 Moderate persistent asthma, uncomplicated: Secondary | ICD-10-CM

## 2017-05-23 MED ORDER — OMALIZUMAB 150 MG ~~LOC~~ SOLR
375.0000 mg | SUBCUTANEOUS | Status: DC
  Administered 2017-05-22: 375 mg via SUBCUTANEOUS

## 2017-05-23 NOTE — Progress Notes (Signed)
[name] injection documentation and charges entered by [name], RMA, based on injection sheet filled out by Dimas Millinammy Scott per office protocol.   Patient  came in on 05/22/2017 to receive a [name] injection. The patient is given 375mg  every 14 days. Due to each vial equalling 150mg , 15mg  of medication was wasted.

## 2017-06-05 ENCOUNTER — Ambulatory Visit (INDEPENDENT_AMBULATORY_CARE_PROVIDER_SITE_OTHER): Payer: Medicare Other

## 2017-06-05 DIAGNOSIS — J4540 Moderate persistent asthma, uncomplicated: Secondary | ICD-10-CM | POA: Diagnosis not present

## 2017-06-06 MED ORDER — OMALIZUMAB 150 MG ~~LOC~~ SOLR
375.0000 mg | SUBCUTANEOUS | Status: DC
  Administered 2017-06-05: 375 mg via SUBCUTANEOUS

## 2017-06-06 NOTE — Progress Notes (Signed)
Documentation of medication administration and charges of [name] have been completed by Jaynee EaglesLindsay Lemons, CMA based on the [name] documentation sheet [facility] by [facility].   [name] Lane came in on 06/05/2017 to receive a [name] injection. The patient is given 375mg  every 28 days. Due to each vial equalling 150mg , 75mg  of medication was wasted.

## 2017-06-14 ENCOUNTER — Telehealth: Payer: Self-pay | Admitting: Pulmonary Disease

## 2017-06-14 NOTE — Telephone Encounter (Signed)
#   vials:6 Ordered date:06/14/17 Shipping Date:06/14/17

## 2017-06-15 NOTE — Telephone Encounter (Signed)
#   Vials:6 Arrival Date:06/15/17 Lot #[id number] Exp Date:09/2020

## 2017-06-19 ENCOUNTER — Ambulatory Visit (INDEPENDENT_AMBULATORY_CARE_PROVIDER_SITE_OTHER): Payer: Medicare Other

## 2017-06-19 ENCOUNTER — Telehealth: Payer: Self-pay | Admitting: Pulmonary Disease

## 2017-06-19 DIAGNOSIS — J4540 Moderate persistent asthma, uncomplicated: Secondary | ICD-10-CM

## 2017-06-19 DIAGNOSIS — J45901 Unspecified asthma with (acute) exacerbation: Secondary | ICD-10-CM

## 2017-06-19 NOTE — Telephone Encounter (Signed)
lmtcb X1 for pt  

## 2017-06-20 NOTE — Telephone Encounter (Signed)
Called and spoke with pts son and he stated that RA told the pt that she could still come and see him but the pt would like to get her xolair shots at this practice in [place].  RA please advise. Thanks

## 2017-06-21 MED ORDER — OMALIZUMAB 150 MG ~~LOC~~ SOLR
375.0000 mg | Freq: Once | SUBCUTANEOUS | Status: AC
  Administered 2017-06-19: 375 mg via SUBCUTANEOUS

## 2017-06-21 NOTE — Progress Notes (Signed)
[name] Lane came in on 8.21.18 to receive a [name] injection. The patient is given 375mg  every 14 days. Due to each vial equalling 150mg , 15mg  of medication was wasted.

## 2017-06-22 NOTE — Telephone Encounter (Signed)
Yes, it is possible. I asked [name] Lane about the referral end of it,[place] with [facility]. She knows what to do now and will take care of as she able. I will help with the xolair inj. Records if needed (Kaleem Sartwell S.) Routing to [place].

## 2017-06-22 NOTE — Telephone Encounter (Signed)
OK to refer Send last note

## 2017-06-22 NOTE — Telephone Encounter (Signed)
Tammy, is it possible to switch [name] injection offices? If so, can you help me switch this patient? Thanks!

## 2017-06-28 NOTE — Telephone Encounter (Signed)
Referral has been placed. Will close this message.

## 2017-07-04 ENCOUNTER — Ambulatory Visit (INDEPENDENT_AMBULATORY_CARE_PROVIDER_SITE_OTHER): Payer: Medicare Other

## 2017-07-04 DIAGNOSIS — J4540 Moderate persistent asthma, uncomplicated: Secondary | ICD-10-CM | POA: Diagnosis not present

## 2017-07-05 MED ORDER — OMALIZUMAB 150 MG ~~LOC~~ SOLR
375.0000 mg | SUBCUTANEOUS | Status: DC
  Administered 2017-07-04: 375 mg via SUBCUTANEOUS

## 2017-07-05 NOTE — Progress Notes (Signed)
[name] injection documentation and charges entered by Ashley Caulfield, RMA, based on injection sheet filled out by Tammy Scott per office protocol.   patient came in on 07/04/17 to receive a [name] injection. The patient is given 375mg every 14 days. Due to each vial equalling 150mg, 75mg of medication was wasted.  

## 2017-07-09 ENCOUNTER — Telehealth: Payer: Self-pay | Admitting: Pulmonary Disease

## 2017-07-09 NOTE — Telephone Encounter (Signed)
#   vials:6 Ordered date:07/09/17 Shipping Date:07/09/17 

## 2017-07-10 NOTE — Telephone Encounter (Signed)
#   Vials:6 Arrival Date:07/10/17 Lot #[id number] Exp Date:11/2020  

## 2017-07-17 ENCOUNTER — Ambulatory Visit: Payer: Medicare Other | Admitting: Adult Health

## 2017-07-23 ENCOUNTER — Ambulatory Visit (INDEPENDENT_AMBULATORY_CARE_PROVIDER_SITE_OTHER): Payer: Medicare Other

## 2017-07-23 DIAGNOSIS — J4540 Moderate persistent asthma, uncomplicated: Secondary | ICD-10-CM | POA: Diagnosis not present

## 2017-07-25 MED ORDER — OMALIZUMAB 150 MG ~~LOC~~ SOLR
375.0000 mg | SUBCUTANEOUS | Status: DC
  Administered 2017-07-23: 375 mg via SUBCUTANEOUS

## 2017-07-25 NOTE — Progress Notes (Signed)
[name] Lane came in on 07/23/2017 to receive a [name] injection. The patient is given  every 28 days. Due to each vial equalling ,  of medication was wasted.   Documentation of medication administration and charges of [name] have been completed by Jaynee Eagles, CMA based on the [name] documentation sheet completed by [facility].

## 2017-08-06 ENCOUNTER — Ambulatory Visit (INDEPENDENT_AMBULATORY_CARE_PROVIDER_SITE_OTHER): Payer: Medicare Other

## 2017-08-06 DIAGNOSIS — J4540 Moderate persistent asthma, uncomplicated: Secondary | ICD-10-CM | POA: Diagnosis not present

## 2017-08-07 MED ORDER — OMALIZUMAB 150 MG ~~LOC~~ SOLR
375.0000 mg | SUBCUTANEOUS | Status: DC
  Administered 2017-08-06: 375 mg via SUBCUTANEOUS

## 2017-08-07 NOTE — Progress Notes (Signed)
[name] injection documentation and charges entered by Dorethea Clan, RMA, based on injection sheet filled out by [name] Lane per office protocol.   Patient came in on 08/06/2017 to receive a [name] injection. The patient is given  every 14 days. Due to each vial equalling ,  of medication was wasted.

## 2017-08-13 ENCOUNTER — Telehealth: Payer: Self-pay | Admitting: Pulmonary Disease

## 2017-08-13 NOTE — Telephone Encounter (Signed)
#   vials:6 Ordered date:08/13/17 Shipping Date:08/13/17 

## 2017-08-14 NOTE — Telephone Encounter (Signed)
#   Vials: 6 Arrival Date:08/14/17 Lot #[id number] Exp Date:12/2020  

## 2017-08-20 ENCOUNTER — Ambulatory Visit (INDEPENDENT_AMBULATORY_CARE_PROVIDER_SITE_OTHER): Payer: Medicare Other

## 2017-08-20 DIAGNOSIS — J4550 Severe persistent asthma, uncomplicated: Secondary | ICD-10-CM

## 2017-08-21 MED ORDER — OMALIZUMAB 150 MG ~~LOC~~ SOLR
375.0000 mg | Freq: Once | SUBCUTANEOUS | Status: AC
  Administered 2017-08-20: 375 mg via SUBCUTANEOUS

## 2017-08-21 NOTE — Progress Notes (Signed)
Mrs. [name] Lane came in on 08/20/17 to receive a [name] injection. The patient is given 375mg  every 14 days. Due to each vial equalling 150mg , 15mg  of medication was wasted.

## 2017-09-03 ENCOUNTER — Ambulatory Visit (INDEPENDENT_AMBULATORY_CARE_PROVIDER_SITE_OTHER): Payer: Medicare Other

## 2017-09-03 DIAGNOSIS — J4540 Moderate persistent asthma, uncomplicated: Secondary | ICD-10-CM | POA: Diagnosis not present

## 2017-09-04 DIAGNOSIS — J4540 Moderate persistent asthma, uncomplicated: Secondary | ICD-10-CM | POA: Diagnosis not present

## 2017-09-04 MED ORDER — OMALIZUMAB 150 MG ~~LOC~~ SOLR
375.0000 mg | SUBCUTANEOUS | Status: DC
  Administered 2017-09-04: 375 mg via SUBCUTANEOUS

## 2017-09-04 NOTE — Progress Notes (Signed)
[name] Lane came in on 09/03/2017 to receive a [name] injection. The patient is given 375mg  every 28 days. Due to each vial equalling 150mg , 75mg  of medication was wasted.   Documentation of medication administration and charges of [name] have been completed by Jaynee EaglesLindsay Story Vanvranken, CMA based on the [name] documentation sheet completed by [facility].

## 2017-09-11 ENCOUNTER — Telehealth: Payer: Self-pay | Admitting: Pulmonary Disease

## 2017-09-11 NOTE — Telephone Encounter (Signed)
#   vials:6 Ordered date:09/11/17 Shipping Date:09/11/17

## 2017-09-12 NOTE — Telephone Encounter (Signed)
#   Vials:6 Arrival Date:09/12/17 Lot #[id number] Exp Date:12/2020

## 2017-09-17 ENCOUNTER — Ambulatory Visit (INDEPENDENT_AMBULATORY_CARE_PROVIDER_SITE_OTHER): Payer: Medicare Other

## 2017-09-17 DIAGNOSIS — J4540 Moderate persistent asthma, uncomplicated: Secondary | ICD-10-CM | POA: Diagnosis not present

## 2017-09-18 MED ORDER — OMALIZUMAB 150 MG ~~LOC~~ SOLR
375.0000 mg | SUBCUTANEOUS | Status: DC
  Administered 2017-09-17: 375 mg via SUBCUTANEOUS

## 2017-09-18 NOTE — Progress Notes (Signed)
[name] injection documentation and charges entered by Dorethea ClanAshley Deeana Atwater, RMA, based on injection sheet filled out by [name] Millinammy Lane per office protocol.   Patient came in on 09/17/17 to receive a [name] injection. The patient is given 375mg  every 14 days. Due to each vial equalling 150mg , 75mg  of medication was wasted.

## 2017-10-01 ENCOUNTER — Ambulatory Visit (INDEPENDENT_AMBULATORY_CARE_PROVIDER_SITE_OTHER): Payer: Medicare Other

## 2017-10-01 ENCOUNTER — Ambulatory Visit: Payer: Medicare Other

## 2017-10-01 ENCOUNTER — Encounter: Payer: Self-pay | Admitting: Adult Health

## 2017-10-01 ENCOUNTER — Ambulatory Visit (INDEPENDENT_AMBULATORY_CARE_PROVIDER_SITE_OTHER): Payer: Medicare Other | Admitting: Adult Health

## 2017-10-01 DIAGNOSIS — J4540 Moderate persistent asthma, uncomplicated: Secondary | ICD-10-CM | POA: Diagnosis not present

## 2017-10-01 DIAGNOSIS — J449 Chronic obstructive pulmonary disease, unspecified: Secondary | ICD-10-CM | POA: Diagnosis not present

## 2017-10-01 DIAGNOSIS — J45901 Unspecified asthma with (acute) exacerbation: Secondary | ICD-10-CM

## 2017-10-01 NOTE — Patient Instructions (Addendum)
Get flu shot as discussed.  Continue on current regimen  Decrease prednisone 5mg  alternating with 5mg  1/2 daily for 1 month then 5mg  1/2 daily until seen back .  Follow up Dr. Vassie [name]  In 3 months and .As needed

## 2017-10-01 NOTE — Progress Notes (Signed)
@Patient  ID: Acey Lav, female    DOB: Jul 28, 1941, 76 y.o.   MRN: [id number]  Chief Complaint  Patient presents with  . Follow-up    COPD     Referring provider: [name], MD  HPI: 76 yo female followed for COPD/Asthma  Steroid dependent since 2011.  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 Triggers being activity, perfumes, spring allergies (claritin helps) , cold weather etc.  She hassevere anxiety & agoraphobia,  on ativan  ON spiriva since june 2011with improved exercise tolerance       Has a dog &bird- cockatoo, clean cage.   Significant tests/ events Spirometry  03/2017 >>FEv1 56 % , improved from 48% in the past  RAST - IGE 636, +house dust, mildly pos for cat &dog dander  PSG -Moderate OSA - on CPAP 12 cm  02/2011 >>started daliresp   10/01/2017 Follow up : COPD/Ashtma  Pt returns for 6 month follow up . Says breathing is doing well . No flare of cough or wheezing . She has moved to [place] with son but wants to keep pulmonary care here at our clinic . Is looking to see if she can get xolair closer to home. Referral was sent but did not hear from [facility] asthma and allergy .  Remains on Prednisone 5mg  daily . She was instructed to taper down but says she was afraid to do this as she is doing so well and does not want to mess it up . We discussed steroid side effects. . On pulmicort neb Twice daily  , spiriva daily. Daliresp daily.  On [name] . Got injection today .    Allergies  Allergen Reactions  . Heparin Other (See Comments)    PATIENT ALMOST DIED FROM SIGNIFICANT BLEEDING EVENT, WAS BROUGHT TO [facility] ED AS A RESULT (2001)  . Morphine And Related Other (See Comments)    CAUSED SIGNIFICANT HYPOTENSION AND PATIENT CRASHED AND NEEDED INSTANT INTERVENTION  . Atorvastatin Other (See Comments)    REACTION: muscle aches  . Benzonatate Other (See Comments)    REACTION: confusion    Immunization  History  Administered Date(s) Administered  . Influenza Split 08/25/2011, 07/30/2013  . Influenza Whole 07/30/2009, 09/07/2010, 08/28/2012  . Influenza, High Dose Seasonal PF 10/17/2016  . Influenza,inj,Quad PF,6+ Mos 09/03/2015    Past Medical History:  Diagnosis Date  . Anxiety   . CAD (coronary artery disease)    a. 2003 BMS to RCA & LAD; PTCA of ISR 2003 b.12/2013 - staged DES PCI of the D1 and mRCA. c. 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  . Carotid bruit   . CKD (chronic kidney disease), stage III (HCC)    a. Stage III-IV.  Marland Kitchen COPD (chronic obstructive pulmonary disease) (HCC)   . Edema   . GE reflux   . HLD (hyperlipidemia)   . Hypertension   . Mild aortic stenosis    a. By cath 04/09/2014.  Marland Kitchen Obstructive sleep apnea   . Retroperitoneal bleed    a. Hx of this on lovenox in 2001 (had been admitted for NSTEMI).  . Type 2 diabetes mellitus treated with insulin (HCC)   . Unspecified asthma(493.90)     Tobacco History: Social History   Tobacco Use  Smoking Status Former Smoker  . Last attempt to quit: 10/31/1999  . Years since quitting: 17.9  Smokeless Tobacco  Never Used   Counseling given: Not Answered   Outpatient Encounter Medications as of 10/01/2017  Medication Sig  . acetaminophen (TYLENOL) 325 MG tablet Take 325-650 mg by mouth every 6 (six) hours as needed for moderate pain.   Marland Kitchen. albuterol (PROVENTIL HFA;VENTOLIN HFA) 108 (90 BASE) MCG/ACT inhaler Inhale 2 puffs into the lungs every 6 (six) hours as needed for wheezing or shortness of breath.  Marland Kitchen. albuterol (PROVENTIL) (2.5 MG/3ML) 0.083% nebulizer solution Take 2.5 mg by nebulization every 6 (six) hours as needed for wheezing or shortness of breath.  Marland Kitchen. amLODipine (NORVASC) 5 MG tablet Take 5 mg by mouth daily.  Marland Kitchen. aspirin 81 MG tablet Take 81 mg by mouth daily.    . brimonidine (ALPHAGAN) 0.2 % ophthalmic solution Place 2 drops into both eyes daily. 1 drop in  the morning and 1 drop in the evening.  . budesonide (PULMICORT) 0.5 MG/2ML nebulizer solution Take 0.5 mg by nebulization 2 (two) times daily.  . cetirizine (ZYRTEC) 10 MG tablet Take 10 mg by mouth daily.    Marland Kitchen. DALIRESP 500 MCG TABS tablet TAKE 1 TABLET BY MOUTH EVERY DAY  . enalapril (VASOTEC) 20 MG tablet Take 1 tablet by mouth daily.  Marland Kitchen. ezetimibe (ZETIA) 10 MG tablet Take 1 tablet (10 mg total) by mouth daily.  . fenofibrate (TRICOR) 145 MG tablet Take 1 tablet (145 mg total) by mouth daily. PLEASE CONTACT OFFICE FOR ADDITIONAL REFILLS  . FLUoxetine (PROZAC) 20 MG capsule Take 20 mg by mouth daily.  . furosemide (LASIX) 20 MG tablet Take 20-40 mg by mouth See admin instructions. TYPICALLY TAKES 1 TAB DAILY, BUT CAN TAKE ADDT'L TAB AS NEEDED FOR SHORTNESS OF BREATH, FLUID BUILDUP  . gentamicin (GARAMYCIN) 0.3 % ophthalmic ointment Place 1 application into the left eye 2 (two) times daily.  . insulin glargine (LANTUS) 100 UNIT/ML injection Inject 30 Units into the skin at bedtime.   . insulin lispro (HUMALOG) 100 UNIT/ML injection Inject 0-12 Units into the skin 3 (three) times daily with meals. If BG less than 150, no insulin given 151-200, takes 3 units 201-250, takes 5 units  251-300, takes 8 units 301-350, takes 10 units 351-400, takes 12 units 400+, call MD  . isosorbide mononitrate (IMDUR) 30 MG 24 hr tablet TAKE 1 TABLET(30 MG) BY MOUTH DAILY  . isosorbide mononitrate (IMDUR) 30 MG 24 hr tablet Take 1 tablet (30 mg total) by mouth daily. Need appointment before anymore refills  . LORazepam (ATIVAN) 1 MG tablet Take 1 mg by mouth 2 (two) times daily as needed for anxiety or sleep.   . metoprolol tartrate (LOPRESSOR) 25 MG tablet Take 1 tablet (25 mg total) by mouth 2 (two) times daily.  . nitroGLYCERIN (NITROSTAT) 0.4 MG SL tablet Place 1 tablet (0.4 mg total) under the tongue every 5 (five) minutes as needed for chest pain.  . potassium chloride (K-DUR) 10 MEQ tablet TAKE 1 TABLET BY  MOUTH EVERY DAY. TAKE ADDTIONAL 1 TABLET ON DAYS YOU TAKE EXTRA LASIX  . pravastatin (PRAVACHOL) 40 MG tablet Take 1 tablet (40 mg total) by mouth daily. (Patient taking differently: Take 40 mg by mouth every evening. )  . prednisoLONE acetate (PRED FORTE) 1 % ophthalmic suspension Place 1 drop into both eyes 3 (three) times daily.  . predniSONE (DELTASONE) 5 MG tablet Take 5 mg by mouth 3 (three) times a week.   . ranitidine (ZANTAC) 150 MG tablet Take 150 mg by mouth 2 (two) times daily.   .Marland Kitchen  tetracaine (PONTOCAINE) 0.5 % ophthalmic solution Place 2 drops into the left eye every 6 (six) hours as needed (Pain).  . ticagrelor (BRILINTA) 90 MG TABS tablet Take 1 tablet (90 mg total) by mouth 2 (two) times daily. NEED OV.  . tiotropium (SPIRIVA) 18 MCG inhalation capsule Place 1 capsule (18 mcg total) into inhaler and inhale daily.   Facility-Administered Encounter Medications as of 10/01/2017  Medication  . omalizumab Geoffry Paradise([name]) injection 375 mg  . omalizumab Geoffry Paradise([name]) injection 375 mg     Review of Systems  Constitutional:   No  weight loss, night sweats,  Fevers, chills, fatigue, or  lassitude.  HEENT:   No headaches,  Difficulty swallowing,  Tooth/dental problems, or  Sore throat,                No sneezing, itching, ear ache, nasal congestion, post nasal drip,   CV:  No chest pain,  Orthopnea, PND, swelling in lower extremities, anasarca, dizziness, palpitations, syncope.   GI  No heartburn, indigestion, abdominal pain, nausea, vomiting, diarrhea, change in bowel habits, loss of appetite, bloody stools.   Resp: No shortness of breath with exertion or at rest.  No excess mucus, no productive cough,  No non-productive cough,  No coughing up of blood.  No change in color of mucus.  No wheezing.  No chest wall deformity  Skin: no rash or lesions.  GU: no dysuria, change in color of urine, no urgency or frequency.  No flank pain, no hematuria   MS:  No joint pain or swelling.  No decreased  range of motion.  No back pain.    Physical Exam  BP 112/68 (BP Location: Right Arm, Cuff Size: Large)   Pulse 61   Ht 5\' 3"  (1.6 m)   Wt 179 lb 6.4 oz (81.4 kg)   SpO2 98%   BMI 31.78 kg/m   GEN: A/Ox3; pleasant , NAD, elderly    HEENT:  [place]/AT,  EACs-clear, TMs-wnl, NOSE-clear, THROAT-clear, no lesions, no postnasal drip or exudate noted.   NECK:  Supple w/ fair ROM; no JVD; normal carotid impulses w/o bruits; no thyromegaly or nodules palpated; no lymphadenopathy.    RESP  Clear  P & A; w/o, wheezes/ rales/ or rhonchi. no accessory muscle use, no dullness to percussion  CARD:  RRR, no m/r/g, no peripheral edema, pulses intact, no cyanosis or clubbing.  GI:   Soft & nt; nml bowel sounds; no organomegaly or masses detected.   Musco: Warm bil, no deformities or joint swelling noted.   Neuro: alert, no focal deficits noted.    Skin: Warm, no lesions or rashes    Lab Results:   BNP No results found for: BNP  ProBNP  Imaging: No results found.   Assessment & Plan:   No problem-specific Assessment & Plan notes found for this encounter.     Rubye Oaksammy Parrett, NP 10/01/2017

## 2017-10-01 NOTE — Assessment & Plan Note (Signed)
Controlled on present regimen   Plan  Patient Instructions  Get flu shot as discussed.  Continue on current regimen  Decrease prednisone 5mg  alternating with 5mg  1/2 daily for 1 month then 5mg  1/2 daily until seen back .  Follow up Dr. Vassie [name]  In 3 months and .As needed

## 2017-10-01 NOTE — Assessment & Plan Note (Addendum)
Doing well on current regimen  Will try to slowly taper down on steroids very slowly as she has been on for years if able  [place]   Plan  Patient Instructions  Get flu shot as discussed.  Continue on current regimen  Decrease prednisone 5mg  alternating with 5mg  1/2 daily for 1 month then 5mg  1/2 daily until seen back .  Follow up Dr. Vassie [name]  In 3 months and .As needed

## 2017-10-02 MED ORDER — OMALIZUMAB 150 MG ~~LOC~~ SOLR
375.0000 mg | Freq: Once | SUBCUTANEOUS | Status: AC
  Administered 2017-10-01: 375 mg via SUBCUTANEOUS

## 2017-10-02 NOTE — Progress Notes (Signed)
Mrs. [name] Lane came in on 10/01/17 to receive a [name] injection. The patient is given 375mg  every 14 days. Due to each vial equalling 150mg , 15mg  of medication was wasted.

## 2017-10-03 ENCOUNTER — Ambulatory Visit: Payer: Medicare Other | Admitting: Adult Health

## 2017-10-05 ENCOUNTER — Telehealth: Payer: Self-pay | Admitting: Pulmonary Disease

## 2017-10-05 NOTE — Telephone Encounter (Signed)
#   vials:6 Ordered date:10/05/17 Shipping Date:10/08/17 

## 2017-10-08 ENCOUNTER — Ambulatory Visit: Payer: Medicare Other | Admitting: Adult Health

## 2017-10-10 NOTE — Telephone Encounter (Signed)
#   Vials:6 Arrival Date:10/10/17 Lot #:[id number] Exp Date:12/2020  

## 2017-10-11 NOTE — Progress Notes (Signed)
Reviewed & agree with plan  

## 2017-10-15 ENCOUNTER — Ambulatory Visit (INDEPENDENT_AMBULATORY_CARE_PROVIDER_SITE_OTHER): Payer: Medicare Other

## 2017-10-15 DIAGNOSIS — J4540 Moderate persistent asthma, uncomplicated: Secondary | ICD-10-CM | POA: Diagnosis not present

## 2017-10-16 MED ORDER — OMALIZUMAB 150 MG ~~LOC~~ SOLR
375.0000 mg | SUBCUTANEOUS | Status: DC
  Administered 2017-10-15: 375 mg via SUBCUTANEOUS

## 2017-10-16 NOTE — Progress Notes (Signed)
[name] Lane came in on 12/17/21018 to receive a [name] injection. The patient is given 375mg  every 14 days. Due to each vial equalling 150mg , 75mg  of medication was wasted.

## 2017-10-29 ENCOUNTER — Ambulatory Visit (INDEPENDENT_AMBULATORY_CARE_PROVIDER_SITE_OTHER): Payer: Medicare Other

## 2017-10-29 DIAGNOSIS — J4540 Moderate persistent asthma, uncomplicated: Secondary | ICD-10-CM

## 2017-11-06 MED ORDER — OMALIZUMAB 150 MG ~~LOC~~ SOLR
375.0000 mg | SUBCUTANEOUS | Status: DC
Start: 2017-10-29 — End: 2017-12-11
  Administered 2017-10-29: 375 mg via SUBCUTANEOUS

## 2017-11-06 NOTE — Progress Notes (Addendum)
[name] RuaCarolyn Kreiger came in on 10/29/2017 to receive a [name] injection. The patient is given 375mg  every 14 days. Due to each vial equalling 150mg , 75mg  of medication was wasted.   Documentation of medication administration and charges of [name] have been completed by Jaynee EaglesLindsay Azalee Weimer, CMA based on the [name] documentation sheet [facility] by [facility].

## 2017-11-12 ENCOUNTER — Ambulatory Visit (INDEPENDENT_AMBULATORY_CARE_PROVIDER_SITE_OTHER): Payer: Medicare Other

## 2017-11-12 DIAGNOSIS — J4540 Moderate persistent asthma, uncomplicated: Secondary | ICD-10-CM | POA: Diagnosis not present

## 2017-11-14 ENCOUNTER — Telehealth: Payer: Self-pay | Admitting: Pulmonary Disease

## 2017-11-15 MED ORDER — OMALIZUMAB 150 MG ~~LOC~~ SOLR
375.0000 mg | SUBCUTANEOUS | Status: DC
  Administered 2017-11-12: 375 mg via SUBCUTANEOUS

## 2017-11-15 NOTE — Progress Notes (Signed)
[name] injection documentation and charges entered by Dorethea ClanAshley Caulfield, RMA, based on injection sheet filled out by Dimas Millinammy Scott per office protocol.   patient came in on 1.14.19 to receive a [name] injection. The patient is given 375mg  every 14 days. Due to each vial equalling 150mg , 75mg  of medication was wasted.

## 2017-11-15 NOTE — Telephone Encounter (Signed)
Vials: 6 Order Date: 11/14/17 Ship Date: 11/14/17

## 2017-11-21 NOTE — Telephone Encounter (Signed)
Error

## 2017-11-26 ENCOUNTER — Ambulatory Visit: Payer: Medicare Other

## 2017-12-10 ENCOUNTER — Ambulatory Visit: Payer: Medicare Other

## 2017-12-10 ENCOUNTER — Encounter: Payer: Self-pay | Admitting: Cardiovascular Disease

## 2017-12-10 ENCOUNTER — Ambulatory Visit (INDEPENDENT_AMBULATORY_CARE_PROVIDER_SITE_OTHER): Payer: Medicare Other | Admitting: Cardiovascular Disease

## 2017-12-10 VITALS — BP 124/58 | HR 70 | Ht 63.0 in | Wt 183.8 lb

## 2017-12-10 DIAGNOSIS — G4733 Obstructive sleep apnea (adult) (pediatric): Secondary | ICD-10-CM | POA: Diagnosis not present

## 2017-12-10 DIAGNOSIS — E669 Obesity, unspecified: Secondary | ICD-10-CM | POA: Diagnosis not present

## 2017-12-10 DIAGNOSIS — N183 Chronic kidney disease, stage 3 unspecified: Secondary | ICD-10-CM

## 2017-12-10 DIAGNOSIS — E782 Mixed hyperlipidemia: Secondary | ICD-10-CM | POA: Diagnosis not present

## 2017-12-10 DIAGNOSIS — J449 Chronic obstructive pulmonary disease, unspecified: Secondary | ICD-10-CM

## 2017-12-10 DIAGNOSIS — I2510 Atherosclerotic heart disease of native coronary artery without angina pectoris: Secondary | ICD-10-CM | POA: Diagnosis not present

## 2017-12-10 DIAGNOSIS — J4540 Moderate persistent asthma, uncomplicated: Secondary | ICD-10-CM

## 2017-12-10 DIAGNOSIS — I10 Essential (primary) hypertension: Secondary | ICD-10-CM | POA: Diagnosis not present

## 2017-12-10 MED ORDER — TICAGRELOR 60 MG PO TABS: 60.0000 mg | ORAL_TABLET | Freq: Two times a day (BID) | ORAL | 3 refills | Status: DC

## 2017-12-10 NOTE — Patient Instructions (Signed)
Medication Instructions:  DECREASE Brilinta to 60 mg two times daily  Follow-Up: Your physician wants you to follow-up in: 12 months with Dr. Tresa EndoKelly. You will receive a reminder letter in the mail two months in advance. If you don't receive a letter, please call our office to schedule the follow-up appointment.   Any Other Special Instructions Will Be Listed Below (If Applicable).     If you need a refill on your cardiac medications before your next appointment, please call your pharmacy.

## 2017-12-10 NOTE — Progress Notes (Signed)
Patient ID: [name] Lane, female   DOB: 06-19-1941, 77 y.o.   MRN: [id number]     HPI: [name] Lane is a 77 y.o. female who presents to the office today for a 14 month follow up cardiology evaluation.  [name] Lane has a history of hypertension, hyperlipidemia, prior CABG with bare-metal stenting to her RCA and LAD in 2003, and PTCA for in-stent restenosis in 2003.  She was admitted to [facility] on 12/30/2013 with chest pain.  A nuclear study suggested distal anterolateral ischemia and she had mildly positive troponin.  Cardiac catheterization by Dr. Ellyn Hack showed severe 2 vessel CAD with 95% proximal diagonal stenosis which was felt to be the culprit lesion.  She also at 80% RCA stenosis.  She underwent single vessel intervention to the diagonal vessel initially and due to renal insufficiency underwent staged intervention to her proximal RCA with DES stent.  She was seen by Dr. Ellyn Hack on 02/12/2014 and had been on dual antiplatelet therapy.  She was rehospitalized with chest pain and was transferred to from [facility].  She underwent repeat cardiac catheterization and her stents were widely patent and the LAD, diagonal, and right coronary artery.  It was felt that her right posterior lateral 3 lesion was the culprit for chest pain, but due to small size medical therapy was initially recommended with plans for potential cutting balloon intervention if recurrent symptoms developed.  She was also noted to have mild aortic stenosis with a peak gradient of 24 and a mean gradient of 19.   When I saw her in followup in June 2015, I recommended further titration of her carvedilol to 12.5 mg twice a day which improved  her chest pain. Time, she had occasional muscle spasms.  She had a history of depression and was taking Prozac.    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  Since I last saw her in December 2017.  She states that she was hospitalized week in  [place] several weeks ago.  She also had broken her left foot.  She denies any chest pain or shortness of breath.  She admits to back discomfort due to degenerative disc disease.  She denies recent bleeding.  She has obstructive sleep apnea and uses CPAP with 100% compliance.  She presents for evaluation.   Past Medical History:  Diagnosis Date  . Anxiety   . CAD (coronary artery disease)    a. 2003 BMS to RCA & LAD; PTCA of ISR 2003 b.12/2013 - staged DES PCI of the D1 and mRCA. c. 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  . Carotid bruit   . CKD (chronic kidney disease), stage III (HCC)    a. Stage III-IV.  Marland Kitchen COPD (chronic obstructive pulmonary disease) ([place])   . Edema   . GE reflux   . HLD (hyperlipidemia)   . Hypertension   . Mild aortic stenosis    a. By cath 04/09/2014.  Marland Kitchen Obstructive sleep apnea   . Retroperitoneal bleed    a. Hx of this on lovenox in 2001 (had been admitted for NSTEMI).  . Type 2 diabetes mellitus treated with insulin ([place])   . Unspecified asthma(493.90)     Past Surgical History:  Procedure Laterality Date  . APPENDECTOMY    . CARDIAC CATHETERIZATION  11/18/1999   Small non-STEMI; moderate 50-60% RCA lesion treated medically secondary to percutaneous RP bleed  . CARDIAC CATHETERIZATION  April 2004, October 2010, December 2011   Stable CAD with mild ISR in LAD and RCA; treated medically  . CARDIAC CATHETERIZATION  01/04/2012   4 small non-STEMI; Myoview with lateral ischemia-- 95% proximal D1, 80% mid RCA post stent -- staged PCI   . CORONARY ANGIOPLASTY   04/29/2002   Cutting Balloon PTCA of LAD stent for ISR with 2.5 mm balloon  . LEFT HEART CATHETERIZATION WITH CORONARY ANGIOGRAM N/A 01/02/2014   Procedure: LEFT HEART CATHETERIZATION WITH CORONARY ANGIOGRAM;  Surgeon: Leonie Man, MD;  Location: [facility] CATH LAB;  Service: Cardiovascular;  Laterality: N/A;  . LEFT HEART CATHETERIZATION WITH CORONARY  ANGIOGRAM N/A 04/09/2014   Procedure: LEFT HEART CATHETERIZATION WITH CORONARY ANGIOGRAM;  Surgeon: Leonie Man, MD;  Location: [facility] CATH LAB;  Service: Cardiovascular;  Laterality: N/A;  . NM MYOVIEW LTD  01/01/2014   EF 64%. The distal anterior/anterolateral ischemia -- referred for cath  . PERCUTANEOUS CORONARY STENT INTERVENTION (PCI-S)  02/17/2002   Two-vessel PCI: 80% mid LAD -- 2.25 mm x 8 mm Express 2 BMS; in RCA RCA 80% 3.5 mm x 12 mm Express 2 BMS  . PERCUTANEOUS CORONARY STENT INTERVENTION (PCI-S)   03/07 & 06/2012   7th - Two-vessel PCI: 80% mid LAD -- 2.25 mm x 8 mm Express 2 BMS; in RCA RCA 80% 3.5 mm x 12 mm Express 2 BMS; 9th - mid RCA a Xience alpine DES 3.5 mm x 15 mm (postdilated 3.5 mm)   . PERCUTANEOUS CORONARY STENT INTERVENTION (PCI-S) N/A 01/06/2014   Procedure: PERCUTANEOUS CORONARY STENT INTERVENTION (PCI-S);  Surgeon: Leonie Man, MD;  Location: [facility] CATH LAB;  Service: Cardiovascular;  Laterality: N/A;  . TRANSTHORACIC ECHOCARDIOGRAM   01/02/2014   (Most recent) moderate concentric LVH. Normal EF of 65-70%. Grade 1 diastolic dysfunction. Mild AI    Allergies  Allergen Reactions  . Heparin Other (See Comments)    PATIENT ALMOST DIED FROM SIGNIFICANT BLEEDING EVENT, WAS BROUGHT TO [facility] ED AS A RESULT (2001)  . Morphine And Related Other (See Comments)    CAUSED SIGNIFICANT HYPOTENSION AND PATIENT CRASHED AND NEEDED INSTANT INTERVENTION  . Atorvastatin Other (See Comments)    REACTION: muscle aches  . Benzonatate Other (See Comments)    REACTION: confusion    Current Outpatient Medications  Medication Sig Dispense Refill  . acetaminophen (TYLENOL) 325 MG tablet Take 325-650 mg by mouth every 6 (six) hours as needed for moderate pain.     Marland Kitchen albuterol (PROVENTIL HFA;VENTOLIN HFA) 108 (90 BASE) MCG/ACT inhaler Inhale 2 puffs into the lungs every 6 (six) hours as needed for wheezing or shortness of breath.    Marland Kitchen albuterol (PROVENTIL) (2.5 MG/3ML) 0.083% nebulizer  solution Take 2.5 mg by nebulization every 6 (six) hours as needed for wheezing or shortness of breath.    Marland Kitchen aspirin 81 MG tablet Take 81 mg by mouth daily.      . budesonide (PULMICORT) 0.5 MG/2ML nebulizer solution Take 0.5 mg by nebulization 2 (two) times daily.    . cetirizine (ZYRTEC) 10 MG tablet Take 10 mg by mouth daily.      Marland Kitchen DALIRESP 500 MCG TABS tablet TAKE 1 TABLET BY MOUTH EVERY DAY 30 tablet 0  . enalapril (VASOTEC) 20 MG tablet Take 1 tablet by mouth daily.  3  . FLUoxetine (PROZAC) 20 MG capsule Take 20 mg by mouth daily.  3  . furosemide (LASIX) 20 MG tablet Take 20-40 mg by mouth See admin instructions. TYPICALLY TAKES  1 TAB DAILY, BUT CAN TAKE ADDT'L TAB AS NEEDED FOR SHORTNESS OF BREATH, FLUID BUILDUP    . gabapentin (NEURONTIN) 300 MG capsule Take 1 capsule by mouth 2 (two) times daily.  0  . insulin glargine (LANTUS) 100 UNIT/ML injection Inject 30 Units into the skin at bedtime.     . insulin lispro (HUMALOG) 100 UNIT/ML injection Inject 0-12 Units into the skin 3 (three) times daily with meals. If BG less than 150, no insulin given 151-200, takes 3 units 201-250, takes 5 units  251-300, takes 8 units 301-350, takes 10 units 351-400, takes 12 units 400+, call MD    . isosorbide mononitrate (IMDUR) 30 MG 24 hr tablet TAKE 1 TABLET(30 MG) BY MOUTH DAILY 90 tablet 0  . LORazepam (ATIVAN) 1 MG tablet Take 1 mg by mouth 2 (two) times daily as needed for anxiety or sleep.     . metoprolol tartrate (LOPRESSOR) 25 MG tablet Take 1 tablet (25 mg total) by mouth 2 (two) times daily. 180 tablet 3  . nitroGLYCERIN (NITROSTAT) 0.4 MG SL tablet Place 1 tablet (0.4 mg total) under the tongue every 5 (five) minutes as needed for chest pain. 25 tablet 6  . potassium chloride (K-DUR) 10 MEQ tablet TAKE 1 TABLET BY MOUTH EVERY DAY. TAKE ADDTIONAL 1 TABLET ON DAYS YOU TAKE EXTRA LASIX 30 tablet 11  . prednisoLONE acetate (PRED FORTE) 1 % ophthalmic suspension Place 1 drop into both eyes 3  (three) times daily.  2  . predniSONE (DELTASONE) 5 MG tablet Take 5 mg by mouth 3 (three) times a week.     . ranitidine (ZANTAC) 150 MG tablet Take 150 mg by mouth 2 (two) times daily.     . rosuvastatin (CRESTOR) 5 MG tablet Take 1 tablet by mouth daily.    Marland Kitchen tetracaine (PONTOCAINE) 0.5 % ophthalmic solution Place 2 drops into the left eye every 6 (six) hours as needed (Pain). 2 mL 0  . ticagrelor (BRILINTA) 60 MG TABS tablet Take 1 tablet (60 mg total) by mouth 2 (two) times daily. 180 tablet 3  . tiotropium (SPIRIVA) 18 MCG inhalation capsule Place 1 capsule (18 mcg total) into inhaler and inhale daily. 30 capsule 1   No current facility-administered medications for this visit.     Social History   Socioeconomic History  . Marital status: Married    Spouse name: Not on file  . Number of children: Not on file  . Years of education: Not on file  . Highest education level: Not on file  Social Needs  . Financial resource strain: Not on file  . Food insecurity - worry: Not on file  . Food insecurity - inability: Not on file  . Transportation needs - medical: Not on file  . Transportation needs - non-medical: Not on file  Occupational History  . Occupation: Retirede  Tobacco Use  . Smoking status: Former Smoker    Last attempt to quit: 10/31/1999    Years since quitting: 18.1  . Smokeless tobacco: Never Used  Substance and Sexual Activity  . Alcohol use: No  . Drug use: No  . Sexual activity: Not on file  Other Topics Concern  . Not on file  Social History Narrative   She is a single mother of 7 (no note of being added divorced or without) 10 grandchildren and 15 great-grandchildren. She lives with her son. Either one of 2 sons or the daughter come with her to appointments. They carry a notebook  complete all her medical data. She does all her activities of daily living. She is a former smoker smoked 20 packs a day for 30 years and quit in 2001. She did not drink alcohol. He does  not get routine exercise due to dyspnea.   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    Family History  Problem Relation Age of Onset  . Breast cancer Mother   . Heart disease Unknown     ROS General: Negative; No fevers, chills, or night sweats HEENT: Positive for left eye blindness; No changes in  hearing, sinus congestion, difficulty swallowing Pulmonary: positive for occasional wheezing/COPD Cardiovascular: See HPI: No chest pain, presyncope, syncope, palpitations Positive for hyperlipidemia GI: Negative; No nausea, vomiting, diarrhea, or abdominal pain GU: Negative; No dysuria, hematuria, or difficulty voiding Musculoskeletal:  broke her left foot Hematologic: Negative; no easy bruising, bleeding Endocrine: Negative; no heat/cold intolerance; no diabetes, Neuro: Negative; no changes in balance, headaches Skin: Negative; No rashes or skin lesions Psychiatric: Positive for depression No behavioral problems, Sleep: Positive for OSA on CPAP therapy, no daytime sleepiness, hypersomnolence, bruxism, restless legs, hypnogognic hallucinations. Other comprehensive 14 point system review is negative   Physical Exam BP (!) 124/58   Pulse 70   Ht _0  (1.6 m)   Wt 183 lb 12.8 oz (83.4 kg)   BMI 32.56 kg/m    Repeat blood pressures 120/70  Wt Readings from Last 3 Encounters:  12/10/17 183 lb 12.8 oz (83.4 kg)  10/01/17 179 lb 6.4 oz (81.4 kg)  04/03/17 160 lb (72.6 kg)   General: Alert, oriented, no distress.  Skin: normal turgor, no rashes, warm and dry HEENT: Normocephalic, atraumatic. Pupils equal round and reactive to light; sclera anicteric; extraocular muscles intact;  Nose without nasal septal hypertrophy Mouth/Parynx benign; Mallinpatti scale 3 Neck: No JVD, no carotid bruits; normal carotid upstroke Lungs: clear to ausculatation and percussion; no wheezing or rales Chest wall: without tenderness to palpitation Heart: PMI not displaced, RRR, s1 s2 normal, 1/6 systolic  murmur, no diastolic murmur, no rubs, gallops, thrills, or heaves Abdomen: soft, nontender; no hepatosplenomehaly, BS+; abdominal aorta nontender and not dilated by palpation. Back: no CVA tenderness Pulses 2+ Musculoskeletal: full range of motion, normal strength, no joint deformities Extremities: no clubbing cyanosis or edema, Homan's sign negative  Neurologic: grossly nonfocal; Cranial nerves grossly wnl Psychologic: Normal mood and affect   ECG (independently read by me): Normal sinus rhythm at 70 bpm.  Nonspecific ST changes.  Normal intervals.  No ectopy.  December 2017 ECG (independently read by me): Normal sinus rhythm at 81 bpm.  Nonspecific ST-T changes.  November 2016 ECG (independently read by me):  Normal sinus rhythm at 72 bpm.  Nonspecific T changes.  September 2015 ECG (independently read by me): Normal sinus rhythm at 71 beats per minute.  Nonspecific ST changes.  04/20/2014 ECG (independently read by me): Normal sinus rhythm at 85 beats per minute.  Nonspecific ST changes.  LABS:  BMP Latest Ref Rng & Units 06/01/2015 05/31/2015 05/30/2015  Glucose 65 - 99 mg/dL 128(H) 187(H) 177(H)  BUN 6 - 20 mg/dL 14 24(H) 29(H)  Creatinine 0.44 - 1.00 mg/dL 1.62(H) 2.06(H) 2.65(H)  Sodium 135 - 145 mmol/L 139 137 137  Potassium 3.5 - 5.1 mmol/L 3.7 3.6 4.1  Chloride 101 - 111 mmol/L 110 109 104  CO2 22 - 32 mmol/L 21(L) 23 19(L)  Calcium 8.9 - 10.3 mg/dL 9.0 8.6(L) 9.0   Hepatic Function Latest Ref Rng &  Units 05/31/2015 02/08/2015 11/03/2014  Total Protein 6.5 - 8.1 g/dL 6.0(L) 7.3 7.0  Albumin 3.5 - 5.0 g/dL 3.4(L) 4.3 4.1  AST 15 - 41 U/L _0 ALT 14 - 54 U/L _1 Alk Phosphatase 38 - 126 U/L 31(L) 42 63  Total Bilirubin 0.3 - 1.2 mg/dL 0.6 0.6 0.5  Bilirubin, Direct 0.0 - 0.3 mg/dL - - -   CBC Latest Ref Rng & Units 06/01/2015 05/31/2015 05/30/2015  WBC 4.0 - 10.5 K/uL 8.6 7.7 8.9  Hemoglobin 12.0 - 15.0 g/dL 9.5(L) 9.3(L) 10.1(L)  Hematocrit 36.0 - 46.0 % 30.5(L)  29.1(L) 30.7(L)  Platelets 150 - 400 K/uL 313 292 333   Lab Results  Component Value Date   MCV 78.4 06/01/2015   MCV 78.4 05/31/2015   MCV 77.1 (L) 05/30/2015   Lab Results  Component Value Date   TSH 0.255 (L) 12/31/2013   Lab Results  Component Value Date   HGBA1C 9.0 (H) 01/01/2014   Lipid Panel     Component Value Date/Time   CHOL 186 02/08/2015 1345   TRIG 205 (H) 02/08/2015 1345   HDL 47 02/08/2015 1345   CHOLHDL 4.0 02/08/2015 1345   VLDL 41 (H) 02/08/2015 1345   LDLCALC 98 02/08/2015 1345   LDLDIRECT 136.1 11/02/2010 1300   RADIOLOGY: Dg Ribs Unilateral W/chest Right  04/08/2014   CLINICAL DATA:  Fall, right rib pain  EXAM: RIGHT RIBS AND CHEST - 3+ VIEW  COMPARISON:  04/07/2014  FINDINGS: Three views right ribs submitted. No acute infiltrate or pulmonary edema. No rib fracture. No pneumothorax.  IMPRESSION: Negative.   Electronically Signed   By: Lahoma Crocker M.D.   On: 04/08/2014 09:13    IMPRESSION: 1. CAD in native artery   2. Essential hypertension   3. Mixed hyperlipidemia   4. OSA (obstructive sleep apnea)   5. Obesity (BMI 30-39.9)   6. Chronic obstructive pulmonary disease, unspecified COPD type ([place])   7. Stage 3 chronic kidney disease (HCC)     ASSESSMENT AND PLAN: CarolBerlanga is a 77 year old female, who has known CAD and is status post prior intervention to her LAD and RCA with bare-metal stents.  She developed unstable angina symptoms in March 2015 and underwent staged DES PCI of the diagonal vessel and mid RCA. Due to recurrent chest pain repeat catheterization revealed previous stented segments to be patent but there was progressive disease in a a small third right posterolateral branch for which initial medical therapy was recommended.  Her blood pressure today is well controlled on her regimen consisting of enalapril 29 g daily, furosemide 20 mg, isosorbide 30 mg, and metoprolol tartrate 25 mg twice a day.  Wheezing improved with discontinuance of  carvedilol and substituting this with metoprolol for improved cardioselectivity.  She continues to use CPAP with 1% compliance.  She's not had any recurrent anginal symptomatology.  Since her last stents were placed in 2015, I have recommended a reduction in her Brilinta dose to 60 Mill grams twice a day from her present 90 twice a day regimen.  She is on rosuvastatin 5 mg for hyperlipidemia with target LDL less than 70.  She is diabetic on insulin.  With reference to her COPD, there is no wheezing presently on Spiriva, Pulmicort and PRN albuterol.   She is obese and weight loss was recommended.  She has a history of chronic kidney disease.  A complete set of fasting laboratory will be obtained.  Medication adjustment will  be done if necessary.  I will see her one year for reevaluation or sooner if problems arise. Time spent: 25 minutes  Troy Sine, MD, [facility]  12/17/2017 1:41 PM

## 2017-12-11 MED ORDER — OMALIZUMAB 150 MG ~~LOC~~ SOLR
375.0000 mg | Freq: Once | SUBCUTANEOUS | Status: AC
  Administered 2017-12-11: 375 mg via SUBCUTANEOUS

## 2017-12-17 ENCOUNTER — Encounter: Payer: Self-pay | Admitting: Cardiovascular Disease

## 2017-12-24 ENCOUNTER — Ambulatory Visit (INDEPENDENT_AMBULATORY_CARE_PROVIDER_SITE_OTHER): Payer: Medicare Other

## 2017-12-24 DIAGNOSIS — J4540 Moderate persistent asthma, uncomplicated: Secondary | ICD-10-CM | POA: Diagnosis not present

## 2017-12-25 MED ORDER — OMALIZUMAB 150 MG ~~LOC~~ SOLR
375.0000 mg | Freq: Once | SUBCUTANEOUS | Status: AC
Start: 2017-12-24 — End: 2017-12-24
  Administered 2017-12-24: 375 mg via SUBCUTANEOUS

## 2018-01-06 ENCOUNTER — Telehealth: Payer: Self-pay | Admitting: Pulmonary Disease

## 2018-01-07 ENCOUNTER — Ambulatory Visit (INDEPENDENT_AMBULATORY_CARE_PROVIDER_SITE_OTHER)
Admission: RE | Admit: 2018-01-07 | Discharge: 2018-01-07 | Disposition: A | Discharge status: Home / Self Care | Payer: Medicare Other | Source: Ambulatory Visit | Attending: Adult Health | Admitting: Adult Health

## 2018-01-07 ENCOUNTER — Ambulatory Visit (INDEPENDENT_AMBULATORY_CARE_PROVIDER_SITE_OTHER): Payer: Medicare Other | Admitting: Adult Health

## 2018-01-07 ENCOUNTER — Encounter: Payer: Self-pay | Admitting: Adult Health

## 2018-01-07 ENCOUNTER — Ambulatory Visit: Payer: Medicare Other | Admitting: Pulmonary Disease

## 2018-01-07 ENCOUNTER — Ambulatory Visit: Payer: Medicare Other | Admitting: Adult Health

## 2018-01-07 ENCOUNTER — Ambulatory Visit (INDEPENDENT_AMBULATORY_CARE_PROVIDER_SITE_OTHER): Payer: Medicare Other

## 2018-01-07 DIAGNOSIS — J4540 Moderate persistent asthma, uncomplicated: Secondary | ICD-10-CM | POA: Diagnosis not present

## 2018-01-07 DIAGNOSIS — J449 Chronic obstructive pulmonary disease, unspecified: Secondary | ICD-10-CM | POA: Diagnosis not present

## 2018-01-07 DIAGNOSIS — I2510 Atherosclerotic heart disease of native coronary artery without angina pectoris: Secondary | ICD-10-CM

## 2018-01-07 MED ORDER — PREDNISONE 5 MG PO TABS: ORAL_TABLET | ORAL | 4 refills | Status: AC

## 2018-01-07 NOTE — Patient Instructions (Signed)
Increase Prednisone 10mg  daily for 1 week then 5mg  daily and hold at this dose .  Continue on Pulmicort Neb Twice daily   Continue on Spiriva daily  Continue Daliresp daily.  Continue [name] .  Discuss with Primary MD that Vasotec may be aggravating your cough .  Follow up with Dr. Vassie [name]  In 4 months and As needed   Please contact office for sooner follow up if symptoms do not improve or worsen or seek emergency care

## 2018-01-07 NOTE — Assessment & Plan Note (Signed)
AB/COPD increased sx on lower chronic steroid dose  Check cxr   Plan  Patient Instructions  Increase Prednisone 10mg  daily for 1 week then 5mg  daily and hold at this dose .  Continue on Pulmicort Neb Twice daily   Continue on Spiriva daily  Continue Daliresp daily.  Continue [name] .  Discuss with Primary MD that Vasotec may be aggravating your cough .  Follow up with Dr. Vassie [name]  In 4 months and As needed   Please contact office for sooner follow up if symptoms do not improve or worsen or seek emergency care

## 2018-01-07 NOTE — Progress Notes (Signed)
@Patient  ID: [name] Lane, female    DOB: 07-Feb-1941, 77 y.o.   MRN: [id number]  Chief Complaint  Patient presents with  . Follow-up    COPD     Referring provider: [name], MD  HPI: 77 yo female former smoker followed for COPD/Asthma  Steroid dependent since 2011.  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 Triggers being activity, perfumes, spring allergies (claritin helps) , cold weather etc.  She hassevere anxiety &agoraphobia, on ativan  ON spiriva since june 2011with improved exercise tolerance  Has a dog &bird- cockatoo, clean cage.   Significant tests/ events Spirometry6/2018>>FEv156% ,improved from 48% in the past RAST - IGE 636, +house dust, mildly pos for cat &dog dander  PSG -Moderate OSA - on CPAP 12 cm  02/2011 >>started daliresp   01/07/2018 Follow up : COPD /Asthma  Patient returns for 39-month follow-up.  Says overall her breathing is doing okay.  Does feel that is not quite as good since going down on prednisone.  Remains on [name] injections. Remains on Pulmicort nebulizer twice daily, Spiriva daily and Daliresp daily.. Patient has moved to [place] with her son but prefers to keep pulmonary care here in [place]. Currently on prednisone 2.5 mg daily.   Pt had fall with foot fracture requiring rehab.    Allergies  Allergen Reactions  . Heparin Other (See Comments)    PATIENT ALMOST DIED FROM SIGNIFICANT BLEEDING EVENT, WAS BROUGHT TO [facility] ED AS A RESULT (2001)  . Morphine And Related Other (See Comments)    CAUSED SIGNIFICANT HYPOTENSION AND PATIENT CRASHED AND NEEDED INSTANT INTERVENTION  . Atorvastatin Other (See Comments)    REACTION: muscle aches  . Benzonatate Other (See Comments)    REACTION: confusion    Immunization History  Administered Date(s) Administered  . Influenza Split 08/25/2011, 07/30/2013  . Influenza Whole 07/30/2009, 09/07/2010, 08/28/2012,  08/21/2017  . Influenza, High Dose Seasonal PF 10/17/2016  . Influenza,inj,Quad PF,6+ Mos 09/03/2015    Past Medical History:  Diagnosis Date  . Anxiety   . CAD (coronary artery disease)    a. 2003 BMS to RCA & LAD; PTCA of ISR 2003 b.12/2013 - staged DES PCI of the D1 and mRCA. c. 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  . Carotid bruit   . CKD (chronic kidney disease), stage III (HCC)    a. Stage III-IV.  Marland Kitchen COPD (chronic obstructive pulmonary disease) (HCC)   . Edema   . GE reflux   . HLD (hyperlipidemia)   . Hypertension   . Mild aortic stenosis    a. By cath 04/09/2014.  Marland Kitchen Obstructive sleep apnea   . Retroperitoneal bleed    a. Hx of this on lovenox in 2001 (had been admitted for NSTEMI).  . Type 2 diabetes mellitus treated with insulin (HCC)   . Unspecified asthma(493.90)     Tobacco History: Social History   Tobacco Use  Smoking Status Former Smoker  . Last attempt to quit: 10/31/1999  . Years since quitting: 18.2  Smokeless Tobacco Never Used   Counseling given: Not Answered   Outpatient Encounter Medications as of 01/07/2018  Medication Sig  . acetaminophen (TYLENOL) 325 MG tablet Take 325-650 mg by mouth every 6 (six) hours as needed for moderate pain.   Marland Kitchen albuterol (PROVENTIL HFA;VENTOLIN HFA) 108 (90 BASE) MCG/ACT inhaler Inhale 2 puffs into the lungs every  6 (six) hours as needed for wheezing or shortness of breath.  Marland Kitchen albuterol (PROVENTIL) (2.5 MG/3ML) 0.083% nebulizer solution Take 2.5 mg by nebulization every 6 (six) hours as needed for wheezing or shortness of breath.  Marland Kitchen aspirin 81 MG tablet Take 81 mg by mouth daily.    . budesonide (PULMICORT) 0.5 MG/2ML nebulizer solution Take 0.5 mg by nebulization 2 (two) times daily.  . cetirizine (ZYRTEC) 10 MG tablet Take 10 mg by mouth daily.    Marland Kitchen DALIRESP 500 MCG TABS tablet TAKE 1 TABLET BY MOUTH EVERY DAY  . enalapril (VASOTEC) 20 MG tablet Take 1 tablet by  mouth daily.  Marland Kitchen FLUoxetine (PROZAC) 20 MG capsule Take 20 mg by mouth daily.  . furosemide (LASIX) 20 MG tablet Take 20-40 mg by mouth See admin instructions. TYPICALLY TAKES 1 TAB DAILY, BUT CAN TAKE ADDT'L TAB AS NEEDED FOR SHORTNESS OF BREATH, FLUID BUILDUP  . gabapentin (NEURONTIN) 300 MG capsule Take 400 mg by mouth 2 (two) times daily.   . insulin glargine (LANTUS) 100 UNIT/ML injection Inject 30 Units into the skin at bedtime.   . insulin lispro (HUMALOG) 100 UNIT/ML injection Inject 0-12 Units into the skin 3 (three) times daily with meals. If BG less than 150, no insulin given 151-200, takes 3 units 201-250, takes 5 units  251-300, takes 8 units 301-350, takes 10 units 351-400, takes 12 units 400+, call MD  . isosorbide mononitrate (IMDUR) 30 MG 24 hr tablet TAKE 1 TABLET(30 MG) BY MOUTH DAILY  . LORazepam (ATIVAN) 1 MG tablet Take 1 mg by mouth 2 (two) times daily as needed for anxiety or sleep.   . metoprolol tartrate (LOPRESSOR) 25 MG tablet Take 1 tablet (25 mg total) by mouth 2 (two) times daily.  . nitroGLYCERIN (NITROSTAT) 0.4 MG SL tablet Place 1 tablet (0.4 mg total) under the tongue every 5 (five) minutes as needed for chest pain.  . potassium chloride (K-DUR) 10 MEQ tablet TAKE 1 TABLET BY MOUTH EVERY DAY. TAKE ADDTIONAL 1 TABLET ON DAYS YOU TAKE EXTRA LASIX  . prednisoLONE acetate (PRED FORTE) 1 % ophthalmic suspension Place 1 drop into both eyes 3 (three) times daily.  . predniSONE (DELTASONE) 5 MG tablet Take 2.5 mg by mouth daily.   . ranitidine (ZANTAC) 150 MG tablet Take 150 mg by mouth 2 (two) times daily.   . rosuvastatin (CRESTOR) 5 MG tablet Take 1 tablet by mouth daily.  Marland Kitchen tetracaine (PONTOCAINE) 0.5 % ophthalmic solution Place 2 drops into the left eye every 6 (six) hours as needed (Pain).  . ticagrelor (BRILINTA) 60 MG TABS tablet Take 1 tablet (60 mg total) by mouth 2 (two) times daily.  Marland Kitchen tiotropium (SPIRIVA) 18 MCG inhalation capsule Place 1 capsule (18 mcg  total) into inhaler and inhale daily.   No facility-administered encounter medications on file as of 01/07/2018.      Review of Systems  Constitutional:   No  weight loss, night sweats,  Fevers, chills,  +fatigue, or  lassitude.  HEENT:   No headaches,  Difficulty swallowing,  Tooth/dental problems, or  Sore throat,                No sneezing, itching, ear ache, nasal congestion, post nasal drip,   CV:  No chest pain,  Orthopnea, PND, swelling in lower extremities, anasarca, dizziness, palpitations, syncope.   GI  No heartburn, indigestion, abdominal pain, nausea, vomiting, diarrhea, change in bowel habits, loss of appetite, bloody stools.   Resp:  No chest wall deformity  Skin: no rash or lesions.  GU: no dysuria, change in color of urine, no urgency or frequency.  No flank pain, no hematuria   MS:  No joint pain or swelling.  No decreased range of motion.  No back pain.    Physical Exam  BP 112/62 (BP Location: Left Arm, Cuff Size: Normal)   Pulse 63   Ht 5\' 3"  (1.6 m)   Wt 186 lb 3.2 oz (84.5 kg)   SpO2 98%   BMI 32.98 kg/m   GEN: A/Ox3; pleasant , NAD, elderly , wc    HEENT:  [place]/AT,  EACs-clear, TMs-wnl, NOSE-clear, THROAT-clear, no lesions, no postnasal drip or exudate noted.   NECK:  Supple w/ fair ROM; no JVD; normal carotid impulses w/o bruits; no thyromegaly or nodules palpated; no lymphadenopathy.    RESP  Clear  P & A; w/o, wheezes/ rales/ or rhonchi. no accessory muscle use, no dullness to percussion  CARD:  RRR, no m/r/g, no peripheral edema, pulses intact, no cyanosis or clubbing.  GI:   Soft & nt; nml bowel sounds; no organomegaly or masses detected.   Musco: Warm bil, no deformities or joint swelling noted. Left ankle boot  Neuro: alert, no focal deficits noted.    Skin: Warm, no lesions or rashes    Lab Results:  CBC  BMET  Imaging: No results found.   Assessment & Plan:   COPD (chronic obstructive pulmonary disease) AB/COPD  increased sx on lower chronic steroid dose  Check cxr   Plan  Patient Instructions  Increase Prednisone 10mg  daily for 1 week then 5mg  daily and hold at this dose .  Continue on Pulmicort Neb Twice daily   Continue on Spiriva daily  Continue Daliresp daily.  Continue [name] .  Discuss with Primary MD that Vasotec may be aggravating your cough .  Follow up with Dr. Vassie [name]  In 4 months and As needed   Please contact office for sooner follow up if symptoms do not improve or worsen or seek emergency care          Rubye Oaksammy Trudi Morgenthaler, NP 01/07/2018

## 2018-01-07 NOTE — Addendum Note (Signed)
Addended by: Boone Master E on: 01/07/2018 03:43 PM   Modules accepted: Orders

## 2018-01-08 MED ORDER — OMALIZUMAB 150 MG ~~LOC~~ SOLR
375.0000 mg | Freq: Once | SUBCUTANEOUS | Status: AC
  Administered 2018-01-07: 375 mg via SUBCUTANEOUS

## 2018-01-09 ENCOUNTER — Telehealth: Payer: Self-pay | Admitting: Pulmonary Disease

## 2018-01-09 NOTE — Telephone Encounter (Signed)
Notes recorded by Julio SicksParrett, Tammy S, NP on 01/09/2018 at 9:21 AM EDT CXR is ok  No changes  Cont w/ ov recs Please contact office for sooner follow up if symptoms do not improve or worsen or seek emergency care   Advised pt of results. Pt understood and nothing further is needed.

## 2018-01-21 ENCOUNTER — Ambulatory Visit (INDEPENDENT_AMBULATORY_CARE_PROVIDER_SITE_OTHER): Payer: Medicare Other

## 2018-01-21 DIAGNOSIS — J4540 Moderate persistent asthma, uncomplicated: Secondary | ICD-10-CM | POA: Diagnosis not present

## 2018-01-22 MED ORDER — OMALIZUMAB 150 MG ~~LOC~~ SOLR
375.0000 mg | SUBCUTANEOUS | Status: DC
  Administered 2018-01-21: 375 mg via SUBCUTANEOUS

## 2018-01-22 NOTE — Telephone Encounter (Signed)
Prefilled Syringes: # 150mg  4  #75mg  2 Arrival Date:o1/17/19 Lot #: 150mg  [id number]      75mg [id number] Exp Date: 150mg  09/2018   75mg  09/2018

## 2018-01-22 NOTE — Progress Notes (Signed)
[name] injection documentation and charges entered by Stepfanie Yott, RMA, based on injection sheet filled out by Tammy Scott during preparation and administration. This documentation process is due to office requirements.   

## 2018-01-22 NOTE — Telephone Encounter (Signed)
Prefilled Syringes: # 150mg  4  #75mg  2 Arrival Date: 01/06/18 Lot #: 150mg  [id number]      75mg [id number] Exp Date: 150mg  09/2018   75mg  09/2018  PerKatie use [name] Lane [name] because Dr. Belia HemanKasa d/c'd her's,  b/c of reaction. (It had already been ordered.) Nothing further needed.

## 2018-02-04 ENCOUNTER — Ambulatory Visit (INDEPENDENT_AMBULATORY_CARE_PROVIDER_SITE_OTHER): Payer: Medicare Other

## 2018-02-04 ENCOUNTER — Telehealth: Payer: Self-pay | Admitting: Pulmonary Disease

## 2018-02-04 DIAGNOSIS — J4540 Moderate persistent asthma, uncomplicated: Secondary | ICD-10-CM | POA: Diagnosis not present

## 2018-02-04 NOTE — Telephone Encounter (Signed)
Prefilled Syringe: #150mg 4  #75mg 2 Ordered Date: 02/04/2018 Shipping Date: 02/04/2018 

## 2018-02-05 MED ORDER — OMALIZUMAB 150 MG ~~LOC~~ SOLR
375.0000 mg | SUBCUTANEOUS | Status: DC
Start: 2018-02-04 — End: 2018-04-30
  Administered 2018-02-04: 375 mg via SUBCUTANEOUS

## 2018-02-05 NOTE — Progress Notes (Signed)
Documentation of medication administration and charges of [name] have been completed by Jaynee EaglesLindsay Esmond Hinch, CMA based on the [name] documentation sheet completed by [facility].   **Pt was given samples due to her not having medication in stock.**

## 2018-02-05 NOTE — Telephone Encounter (Signed)
Prefilled Syringes: # 150mg 4  #75mg 2 Arrival Date:02/05/2018 Lot #: 150mg [id number]      75mg [id number] Exp Date: 150mg 12/19   75mg 12/19   

## 2018-02-18 ENCOUNTER — Ambulatory Visit (INDEPENDENT_AMBULATORY_CARE_PROVIDER_SITE_OTHER): Payer: Medicare Other

## 2018-02-18 DIAGNOSIS — J4540 Moderate persistent asthma, uncomplicated: Secondary | ICD-10-CM

## 2018-02-19 MED ORDER — OMALIZUMAB 150 MG ~~LOC~~ SOLR
375.0000 mg | SUBCUTANEOUS | Status: DC
  Administered 2018-02-18: 375 mg via SUBCUTANEOUS

## 2018-02-19 NOTE — Progress Notes (Signed)
Documentation of medication administration and charges of [name] have been completed by Lindsay Lemons, CMA based on the [name] documentation sheet completed by Tammy Scott.  

## 2018-03-04 ENCOUNTER — Ambulatory Visit (INDEPENDENT_AMBULATORY_CARE_PROVIDER_SITE_OTHER): Payer: Medicare Other

## 2018-03-04 DIAGNOSIS — J4540 Moderate persistent asthma, uncomplicated: Secondary | ICD-10-CM | POA: Diagnosis not present

## 2018-03-05 DIAGNOSIS — J4540 Moderate persistent asthma, uncomplicated: Secondary | ICD-10-CM

## 2018-03-05 MED ORDER — OMALIZUMAB 150 MG ~~LOC~~ SOLR
375.0000 mg | SUBCUTANEOUS | Status: DC
  Administered 2018-03-05: 375 mg via SUBCUTANEOUS

## 2018-03-05 NOTE — Progress Notes (Signed)
Documentation of medication administration and charges of [name] have been completed by Lindsay Lemons, CMA based on the [name] documentation sheet completed by Tammy Scott.  

## 2018-03-07 ENCOUNTER — Telehealth: Payer: Self-pay | Admitting: Pulmonary Disease

## 2018-03-07 NOTE — Telephone Encounter (Signed)
Prefilled Syringe: #150mg 4  #75mg 2 Ordered Date: 03/07/18 Shipping Date: 03/07/18 

## 2018-03-08 NOTE — Telephone Encounter (Signed)
Prefilled Syringes: #  4  #75mg  2 Arrival Date: 03/08/2018 Lot #:  [id number]       [id number] Exp Date:  09/2018    09/2018

## 2018-03-18 ENCOUNTER — Ambulatory Visit (INDEPENDENT_AMBULATORY_CARE_PROVIDER_SITE_OTHER): Payer: Medicare Other

## 2018-03-18 DIAGNOSIS — J4540 Moderate persistent asthma, uncomplicated: Secondary | ICD-10-CM | POA: Diagnosis not present

## 2018-03-19 DIAGNOSIS — J4540 Moderate persistent asthma, uncomplicated: Secondary | ICD-10-CM

## 2018-03-19 MED ORDER — OMALIZUMAB 150 MG ~~LOC~~ SOLR
375.0000 mg | SUBCUTANEOUS | Status: DC
  Administered 2018-03-19: 375 mg via SUBCUTANEOUS

## 2018-03-19 NOTE — Progress Notes (Signed)
Documentation of medication administration and charges of [name] have been completed by Lonya Johannesen, CMA based on the [name] documentation sheet completed by Ashley Caulfield, RMA. 

## 2018-04-01 ENCOUNTER — Ambulatory Visit (INDEPENDENT_AMBULATORY_CARE_PROVIDER_SITE_OTHER): Payer: Medicare Other

## 2018-04-01 DIAGNOSIS — J4540 Moderate persistent asthma, uncomplicated: Secondary | ICD-10-CM

## 2018-04-02 ENCOUNTER — Telehealth: Payer: Self-pay | Admitting: Pulmonary Disease

## 2018-04-02 MED ORDER — OMALIZUMAB 150 MG ~~LOC~~ SOLR
375.0000 mg | Freq: Once | SUBCUTANEOUS | Status: AC
  Administered 2018-04-01: 375 mg via SUBCUTANEOUS

## 2018-04-03 ENCOUNTER — Telehealth: Payer: Self-pay | Admitting: Pulmonary Disease

## 2018-04-03 NOTE — Telephone Encounter (Signed)
Created in error

## 2018-04-03 NOTE — Telephone Encounter (Addendum)
Prefilled Syringe: #150mg  4  #75mg  2 Ordered Date: 04/02/18 Shipping Date: 04/02/18

## 2018-04-03 NOTE — Telephone Encounter (Signed)
Prefilled Syringes: # 150mg  4  #75mg  2 Arrival Date: 04/03/18 Lot #: 150mg  [id number]      75mg  [id number] Exp Date: 150mg  09/2018   75mg  09/2018

## 2018-04-15 ENCOUNTER — Ambulatory Visit (INDEPENDENT_AMBULATORY_CARE_PROVIDER_SITE_OTHER): Payer: Medicare Other

## 2018-04-15 DIAGNOSIS — J4540 Moderate persistent asthma, uncomplicated: Secondary | ICD-10-CM

## 2018-04-16 MED ORDER — OMALIZUMAB 150 MG ~~LOC~~ SOLR
375.0000 mg | SUBCUTANEOUS | Status: DC
  Administered 2018-04-15: 375 mg via SUBCUTANEOUS

## 2018-04-16 NOTE — Progress Notes (Signed)
Documentation of medication administration and charges of [name] have been completed by Latrish Mogel, CMA based on the [name] documentation sheet completed by Tammy Scott.  

## 2018-04-29 ENCOUNTER — Ambulatory Visit (INDEPENDENT_AMBULATORY_CARE_PROVIDER_SITE_OTHER): Payer: Medicare Other

## 2018-04-29 DIAGNOSIS — J4540 Moderate persistent asthma, uncomplicated: Secondary | ICD-10-CM | POA: Diagnosis not present

## 2018-04-30 MED ORDER — OMALIZUMAB 150 MG ~~LOC~~ SOLR
375.0000 mg | Freq: Once | SUBCUTANEOUS | Status: AC
  Administered 2018-04-29: 375 mg via SUBCUTANEOUS

## 2018-05-10 ENCOUNTER — Telehealth: Payer: Self-pay | Admitting: Pulmonary Disease

## 2018-05-10 NOTE — Telephone Encounter (Signed)
Prefilled Syringe: #150mg  4  #75mg  2 Ordered Date: 05/10/18 Shipping Date: 05/12/18

## 2018-05-13 ENCOUNTER — Ambulatory Visit (INDEPENDENT_AMBULATORY_CARE_PROVIDER_SITE_OTHER): Payer: Medicare Other

## 2018-05-13 DIAGNOSIS — J4540 Moderate persistent asthma, uncomplicated: Secondary | ICD-10-CM

## 2018-05-13 MED ORDER — OMALIZUMAB 150 MG ~~LOC~~ SOLR
375.0000 mg | SUBCUTANEOUS | Status: DC
  Administered 2018-05-13: 375 mg via SUBCUTANEOUS

## 2018-05-15 NOTE — Telephone Encounter (Signed)
Me       10:31 AM  Note    Prefilled Syringes: # 150mg  4  #75mg  2 Arrival Date: 7.15.19 Lot #: 150mg  [id number]      75mg  [id number] Exp Date: 150mg  10/2018   75mg  11/2018

## 2018-05-27 ENCOUNTER — Ambulatory Visit (INDEPENDENT_AMBULATORY_CARE_PROVIDER_SITE_OTHER): Payer: Medicare Other

## 2018-05-27 DIAGNOSIS — J4540 Moderate persistent asthma, uncomplicated: Secondary | ICD-10-CM

## 2018-05-28 MED ORDER — OMALIZUMAB 150 MG ~~LOC~~ SOLR
375.0000 mg | SUBCUTANEOUS | Status: DC
  Administered 2018-05-27: 375 mg via SUBCUTANEOUS

## 2018-05-28 NOTE — Progress Notes (Signed)
Documentation of medication administration and charges of [name] have been completed by Lindsay Lemons, CMA based on the [name] documentation sheet completed by Tammy Scott.  

## 2018-06-10 ENCOUNTER — Ambulatory Visit (INDEPENDENT_AMBULATORY_CARE_PROVIDER_SITE_OTHER): Payer: Medicare Other

## 2018-06-10 DIAGNOSIS — J4540 Moderate persistent asthma, uncomplicated: Secondary | ICD-10-CM

## 2018-06-10 MED ORDER — OMALIZUMAB 150 MG ~~LOC~~ SOLR
375.0000 mg | Freq: Once | SUBCUTANEOUS | Status: AC
  Administered 2018-06-10: 375 mg via SUBCUTANEOUS

## 2018-06-12 ENCOUNTER — Telehealth: Payer: Self-pay | Admitting: Pulmonary Disease

## 2018-06-12 NOTE — Telephone Encounter (Signed)
Prefilled Syringe: #150mg 4  #75mg 2 Ordered Date: 06/12/18 Shipping Date: 06/12/18  

## 2018-06-13 NOTE — Telephone Encounter (Signed)
Prefilled Syringes: # 150mg  4  #75mg  2 Arrival Date: 06/13/18 Lot #: 150mg  [id number]      75mg  [id number] Exp Date: 150mg  11/2018   75mg  11/2018

## 2018-06-24 ENCOUNTER — Ambulatory Visit (INDEPENDENT_AMBULATORY_CARE_PROVIDER_SITE_OTHER): Payer: Medicare Other

## 2018-06-24 DIAGNOSIS — J4540 Moderate persistent asthma, uncomplicated: Secondary | ICD-10-CM

## 2018-06-24 MED ORDER — OMALIZUMAB 150 MG ~~LOC~~ SOLR
375.0000 mg | SUBCUTANEOUS | Status: DC
  Administered 2018-06-24: 375 mg via SUBCUTANEOUS

## 2018-06-24 NOTE — Progress Notes (Signed)
Documentation of medication administration and charges of [name] have been completed by Ashford Clouse, CMA based on the hand written [name] documentation sheet completed by Tammy Scott, who administered the medication.  

## 2018-07-08 ENCOUNTER — Ambulatory Visit (INDEPENDENT_AMBULATORY_CARE_PROVIDER_SITE_OTHER): Payer: Medicare Other

## 2018-07-08 DIAGNOSIS — J4540 Moderate persistent asthma, uncomplicated: Secondary | ICD-10-CM

## 2018-07-10 MED ORDER — OMALIZUMAB 150 MG ~~LOC~~ SOLR
375.0000 mg | SUBCUTANEOUS | Status: DC
  Administered 2018-07-08: 375 mg via SUBCUTANEOUS

## 2018-07-10 NOTE — Progress Notes (Signed)
Documentation of medication administration and charges of [name] have been completed by Jaynee Eagles, CMA based on the hand written [name] documentation sheet completed by Dorethea Clan, RMA, who administered the medication.

## 2018-07-18 ENCOUNTER — Ambulatory Visit (INDEPENDENT_AMBULATORY_CARE_PROVIDER_SITE_OTHER): Payer: Medicare Other | Admitting: Pulmonary Disease

## 2018-07-18 ENCOUNTER — Encounter: Payer: Self-pay | Admitting: Pulmonary Disease

## 2018-07-18 DIAGNOSIS — Z9989 Dependence on other enabling machines and devices: Secondary | ICD-10-CM

## 2018-07-18 DIAGNOSIS — I2510 Atherosclerotic heart disease of native coronary artery without angina pectoris: Secondary | ICD-10-CM

## 2018-07-18 DIAGNOSIS — J4551 Severe persistent asthma with (acute) exacerbation: Secondary | ICD-10-CM | POA: Diagnosis not present

## 2018-07-18 DIAGNOSIS — Z23 Encounter for immunization: Secondary | ICD-10-CM | POA: Diagnosis not present

## 2018-07-18 DIAGNOSIS — G4733 Obstructive sleep apnea (adult) (pediatric): Secondary | ICD-10-CM

## 2018-07-18 MED ORDER — FLUTICASONE-UMECLIDIN-VILANT 100-62.5-25 MCG/INH IN AEPB: 1.0000 | INHALATION_SPRAY | Freq: Every day | RESPIRATORY_TRACT | 0 refills | Status: DC

## 2018-07-18 NOTE — Progress Notes (Signed)
   Subjective:    Patient ID: [name] Lane, female    DOB: 02/27/1941, 77 y.o.   MRN: [id number]  HPI  77 yo ex- smoker, quit 2001 for FU of COPD/asthma,  She issteroid dependent since 2011,lowest 5 mg   Worked in a hosiery mill x 12y. Triggers being activity, perfumes, spring allergies (claritin helps) , cold weather etc.  She hassevere anxiety & agoraphobia,  on ativan    She has now moved to [place] and lives with her son but still drives in  for her 2 weekly [name] shots   Her hospitalizations were remarkably reduced on xolair &5mg  prednisone since jan 2014 -after visit 03/2017, drop prednisone to 2.5 mg but she had flareup and prednisone was increased to 5 mg 12/2017  Currently on pulmicort neb Twice daily , spiriva daily and [name] q 2 weeks . Remains on daily prednisone 5mg  daily &daliresp  Vasotec was stopped after she developed tongue edema   Significant tests/ events Spirometry  03/2017 >>FEv1 56 % , improved from 48% in the past  RAST - IGE 636, +house dust, mildly pos for cat &dog dander  PSG -Moderate OSA - on CPAP 12 cm  02/2011 >>added daliresp      Review of Systems neg for any significant sore throat, dysphagia, itching, sneezing, nasal congestion or excess/ purulent secretions, fever, chills, sweats, unintended wt loss, pleuritic or exertional cp, hempoptysis, orthopnea pnd or change in chronic leg swelling. Also denies presyncope, palpitations, heartburn, abdominal pain, nausea, vomiting, diarrhea or change in bowel or urinary habits, dysuria,hematuria, rash, arthralgias, visual complaints, headache, numbness weakness or ataxia.     Objective:   Physical Exam   Gen. Pleasant, well-nourished, elderly, in no distress ENT - no thrush, no post nasal drip Neck: No JVD, no thyromegaly, no carotid bruits Lungs: no use of accessory muscles, no dullness to percussion, coarse BS without rales or rhonchi  Cardiovascular: Rhythm regular, heart sounds   normal, no murmurs or gallops, no peripheral edema Musculoskeletal: No deformities, no cyanosis or clubbing         Assessment & Plan:

## 2018-07-18 NOTE — Assessment & Plan Note (Signed)
Ct CPAP 12 cm  She is compliant and CPAP is certainly helped improve her daytime somnolence and fatigue

## 2018-07-18 NOTE — Addendum Note (Signed)
Addended by: Maurene CapesPOTTS, Alanda Colton M on: 07/18/2018 03:36 PM   Modules accepted: Orders

## 2018-07-18 NOTE — Assessment & Plan Note (Addendum)
Sample of Trelegy -once daily, rinse mouth after use. This takes the place of Pulmicort and Spiriva Ct 5 mg pred -previous attempt to taper did not work Continue [name] every 2 weeks. High-dose flu shot today   Take calcium and vitamin D supplements daily  Give patient and son the option of establishing with another pulmonologist in [place] to transfer her care but they would like to continue coming to us

## 2018-07-18 NOTE — Patient Instructions (Addendum)
Sample of Trelegy -once daily, rinse mouth after use. This takes the place of Pulmicort and Spiriva  Continue [name] every 2 weeks. High-dose flu shot today  Take calcium and vitamin D supplements daily

## 2018-07-19 IMAGING — DX DG CHEST 2V
2 series · 2 of 2 positions shown · non-contrast
Comparison: 04/08/2014

CLINICAL DATA: COPD.  No current chest complaints.

EXAM:
CHEST - 2 VIEW

[chest pa]
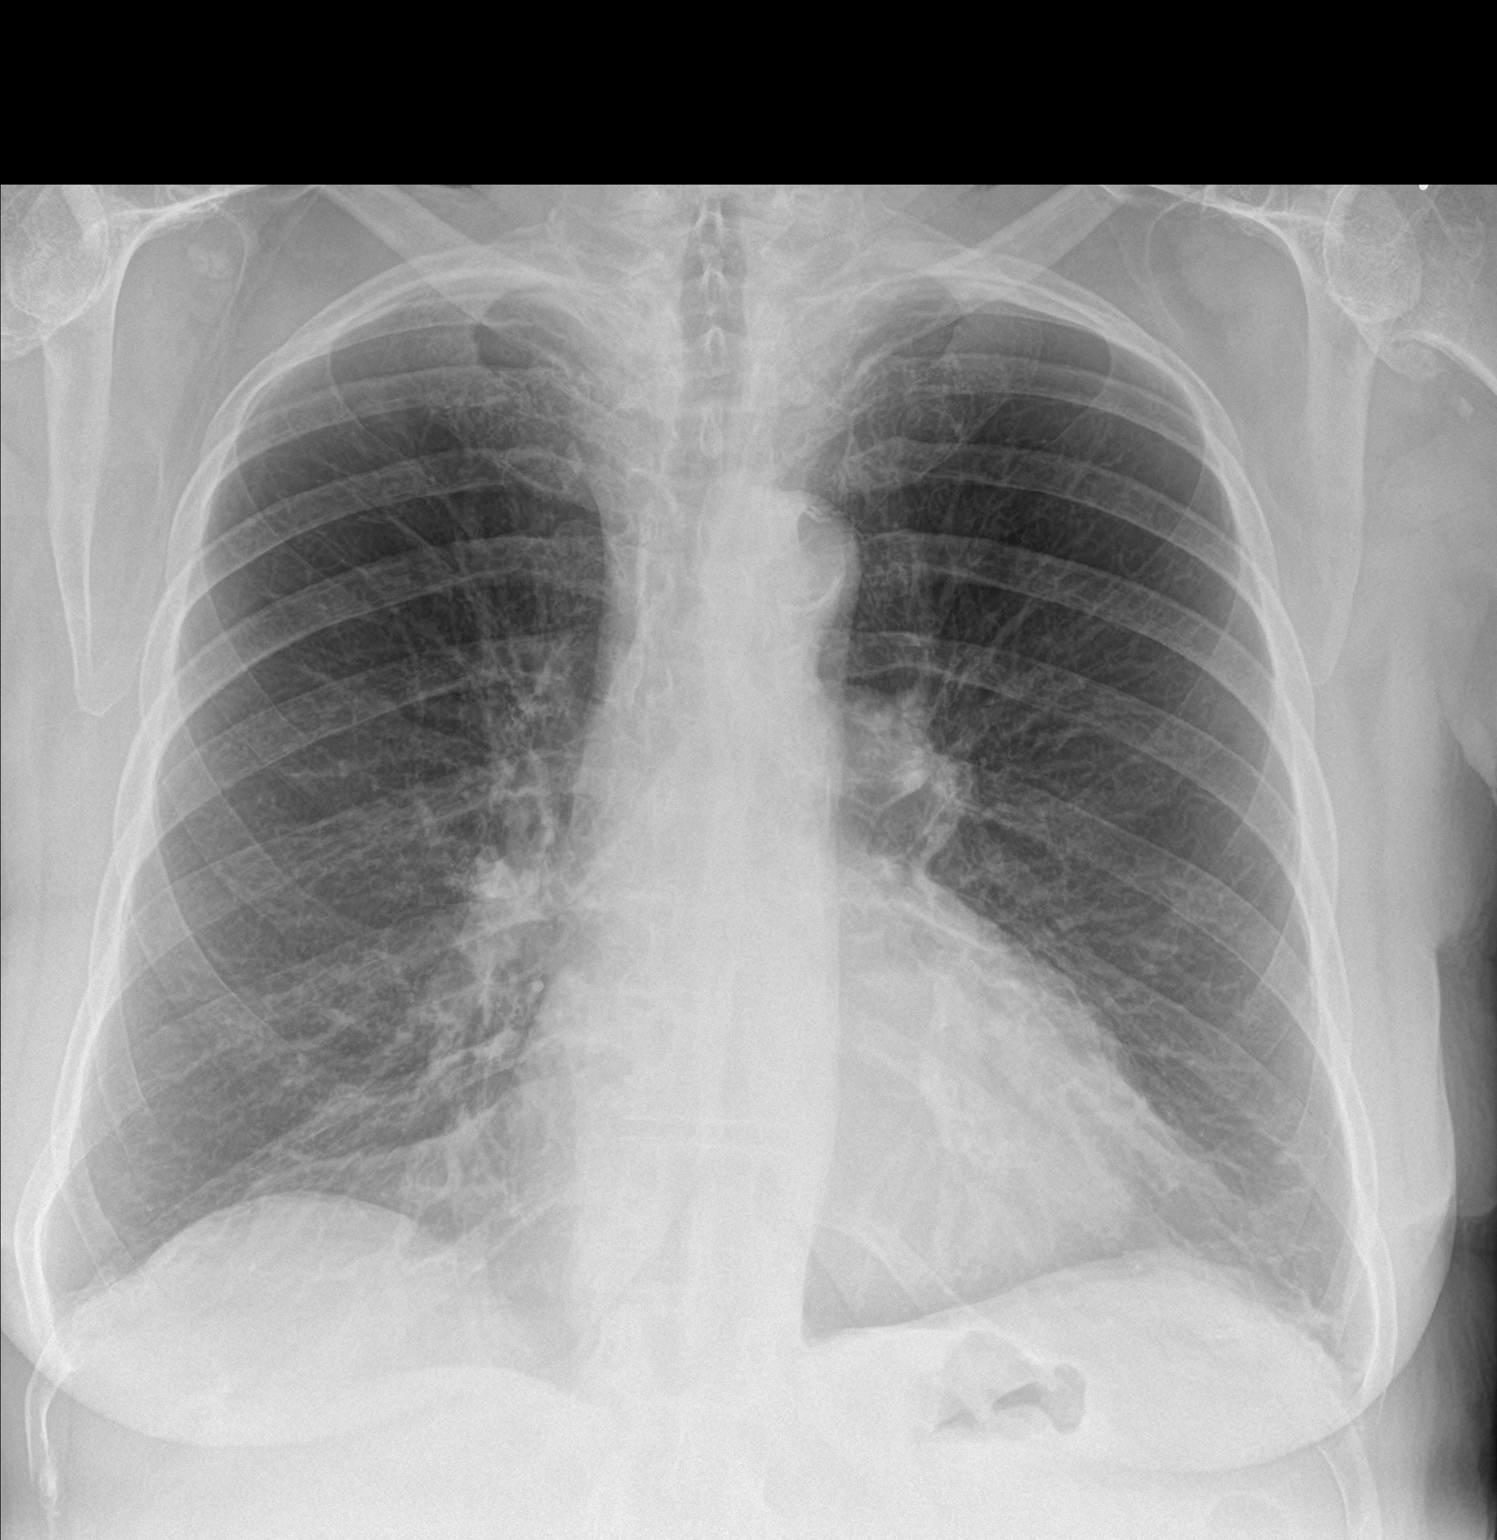

[chest lat]
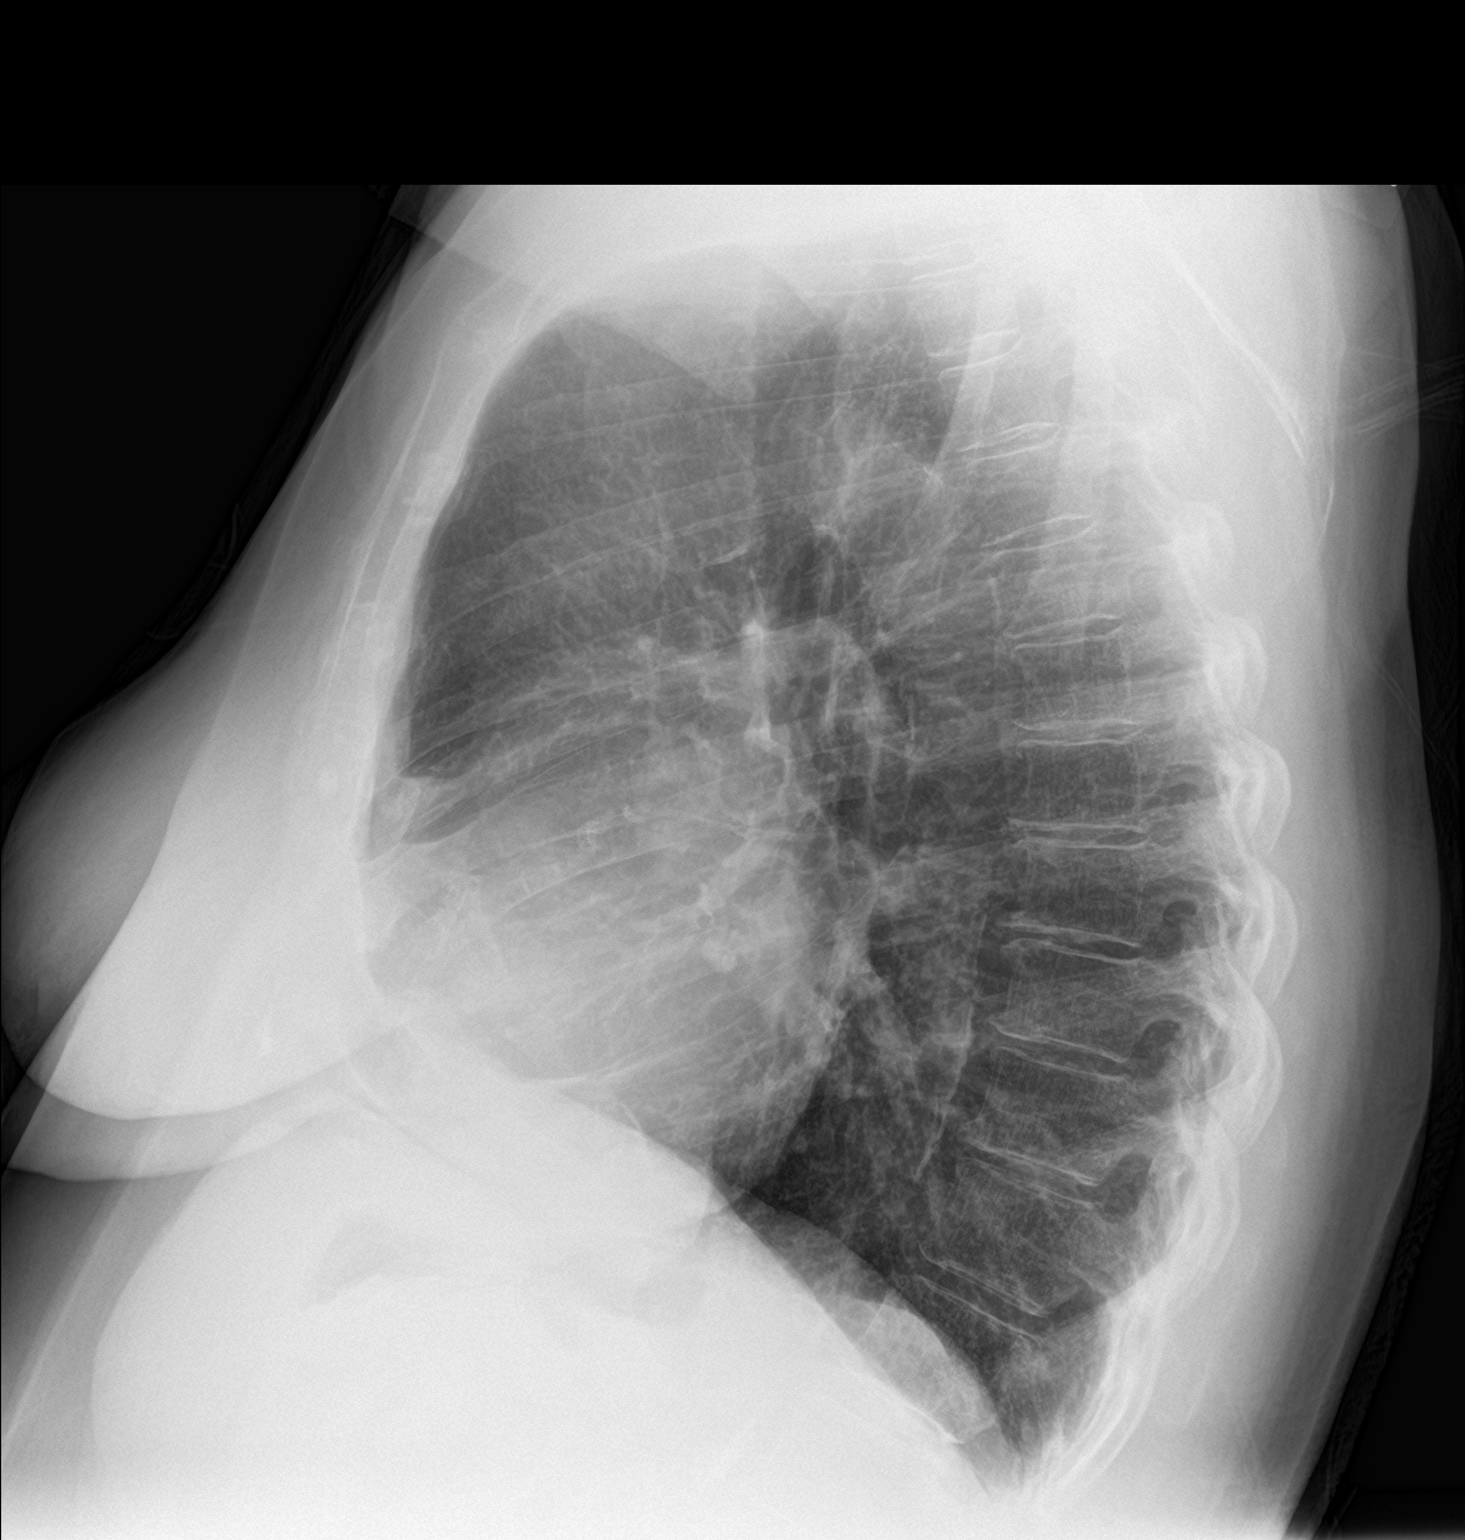

[2 of 2 positions shown; findings below may reference images not displayed]

FINDINGS: The cardiomediastinal silhouette is unchanged and within normal
limits. Aortic atherosclerosis is noted. There is mild chronic
scarring in the lung bases. The lungs are mildly hyperinflated
without evidence of acute airspace consolidation, edema, pleural
effusion, or pneumothorax. There are degenerative changes about the
shoulders.
IMPRESSION: No active cardiopulmonary disease.

## 2018-07-22 ENCOUNTER — Ambulatory Visit (INDEPENDENT_AMBULATORY_CARE_PROVIDER_SITE_OTHER): Payer: Medicare Other

## 2018-07-22 DIAGNOSIS — J4540 Moderate persistent asthma, uncomplicated: Secondary | ICD-10-CM | POA: Diagnosis not present

## 2018-07-22 MED ORDER — OMALIZUMAB 150 MG ~~LOC~~ SOLR
375.0000 mg | Freq: Once | SUBCUTANEOUS | Status: AC
  Administered 2018-07-22: 375 mg via SUBCUTANEOUS

## 2018-07-24 ENCOUNTER — Telehealth: Payer: Self-pay | Admitting: Pulmonary Disease

## 2018-07-24 NOTE — Telephone Encounter (Signed)
Prefilled Syringe: #150mg 4  #75mg 2 Ordered Date: 07/24/18 Shipping Date: 07/24/18 

## 2018-07-25 NOTE — Telephone Encounter (Signed)
Prefilled Syringes: # 150mg 4  #75mg 2 Arrival Date: 07/25/18 Lot #: 150mg [id number]      75mg [id number] Exp Date: 150mg 12/2018   75mg 12/2018  

## 2018-08-05 ENCOUNTER — Ambulatory Visit (INDEPENDENT_AMBULATORY_CARE_PROVIDER_SITE_OTHER): Payer: Medicare Other

## 2018-08-05 DIAGNOSIS — J4540 Moderate persistent asthma, uncomplicated: Secondary | ICD-10-CM

## 2018-08-05 MED ORDER — OMALIZUMAB 150 MG ~~LOC~~ SOLR
375.0000 mg | SUBCUTANEOUS | Status: DC
Start: 2018-08-05 — End: 2018-11-04
  Administered 2018-08-05: 375 mg via SUBCUTANEOUS

## 2018-08-05 NOTE — Progress Notes (Signed)
Documentation of medication administration and charges of [name] have been completed by Lindsay Lemons, CMA based on the hand written [name] documentation sheet completed by Tammy Scott, who administered the medication.  

## 2018-08-08 ENCOUNTER — Telehealth: Payer: Self-pay | Admitting: Pulmonary Disease

## 2018-08-08 NOTE — Telephone Encounter (Signed)
Prefilled Syringe: #150mg 4  #75mg 2 Ordered Date: 10.10.19 Shipping Date: 10.11.19  

## 2018-08-09 NOTE — Telephone Encounter (Signed)
Prefilled Syringes: # 150mg 4  #75mg 2 Arrival Date: 10.11.19 Lot #: 150mg [id number]      [id number] Exp Date: 150mg 01/2019   75mg 12/2018   

## 2018-08-19 ENCOUNTER — Ambulatory Visit: Payer: Medicare Other

## 2018-08-26 ENCOUNTER — Ambulatory Visit (INDEPENDENT_AMBULATORY_CARE_PROVIDER_SITE_OTHER): Payer: Medicare Other

## 2018-08-26 DIAGNOSIS — J4540 Moderate persistent asthma, uncomplicated: Secondary | ICD-10-CM

## 2018-08-26 MED ORDER — OMALIZUMAB 150 MG ~~LOC~~ SOLR
375.0000 mg | SUBCUTANEOUS | Status: DC
  Administered 2018-08-26: 375 mg via SUBCUTANEOUS

## 2018-08-26 NOTE — Progress Notes (Signed)
Documentation of medication administration and charges of [name] have been completed by Lindsay Lemons, CMA based on the hand written [name] documentation sheet completed by Tammy Scott, who administered the medication.  

## 2018-08-30 ENCOUNTER — Telehealth: Payer: Self-pay | Admitting: Cardiovascular Disease

## 2018-08-30 NOTE — Telephone Encounter (Signed)
New Message      [facility] Pre-operative Risk Assessment    Request for surgical clearance:  1. What type of surgery is being performed? Bilateral nerve root blocked  2. When is this surgery scheduled? TBD   3. What type of clearance is required (medical clearance vs. Pharmacy clearance to hold med vs. Both)?  Pharmacy   4. Are there any medications that need to be held prior to surgery and how long? Stop Brilinta and aspirin at the cardiologist recommendation   5. Practice name and name of physician performing surgery? [facility] in [place] Dr. Madlyn Frankel    6. What is your office phone number [phone]    7.   What is your office fax number [phone]  8.   Anesthesia type (None, local, MAC, general) ? none   [name] Lane 08/30/2018, 3:20 PM  _________________________________________________________________   (provider comments below)

## 2018-09-02 NOTE — Telephone Encounter (Signed)
   Primary Cardiologist:[name], MD  Chart reviewed as part of pre-operative protocol coverage. Because of [name] Lane past medical history and time since last visit, he/she will require a follow-up visit in order to better assess preoperative cardiovascular risk.  Pre-op covering staff: - Please schedule appointment and call patient to inform them. - Please contact requesting surgeon's office via preferred method (i.e, phone, fax) to inform them of need for appointment prior to surgery.  If applicable, this message will also be routed to pharmacy pool and/or primary cardiologist for input on holding anticoagulant/antiplatelet agent as requested below so that this information is available at time of patient's appointment.   Berton Bon, NP  09/02/2018, 5:02 PM

## 2018-09-03 NOTE — Telephone Encounter (Signed)
Okay to hold aspirin and Plavix for minimum of 5 days prior to the procedure 

## 2018-09-03 NOTE — Telephone Encounter (Signed)
Attempted to reach pt. No answer , Left message for pt to call preop back .

## 2018-09-04 NOTE — Telephone Encounter (Signed)
2nd attempt to reach pt re: appt for surgical clearance. Left detailed message pt needs to call the office for an appt.

## 2018-09-04 NOTE — Telephone Encounter (Signed)
   Primary Cardiologist: Nicki Guadalajara, MD  Chart reviewed as part of pre-operative protocol coverage.   Dr. Tresa Endo - did you mean Brilinta instead of Plavix? Thanks.  Otherwise, will route to callback to make sure this is in their queue given determination that patient needs appt.  Laurann Montana, PA-C 09/04/2018, 2:13 PM

## 2018-09-05 NOTE — Telephone Encounter (Signed)
Yes, okay to hold Brilinta and aspirin for procedure

## 2018-09-09 ENCOUNTER — Ambulatory Visit (INDEPENDENT_AMBULATORY_CARE_PROVIDER_SITE_OTHER): Payer: Medicare Other

## 2018-09-09 DIAGNOSIS — J4540 Moderate persistent asthma, uncomplicated: Secondary | ICD-10-CM

## 2018-09-09 MED ORDER — OMALIZUMAB 150 MG ~~LOC~~ SOLR
375.0000 mg | SUBCUTANEOUS | Status: DC
  Administered 2018-09-09: 375 mg via SUBCUTANEOUS

## 2018-09-09 NOTE — Telephone Encounter (Signed)
Pt has ben scheduled to see Joni Reining, NP, 09/16/18 @ 11:00.

## 2018-09-09 NOTE — Progress Notes (Signed)
Documentation of medication administration and charges of [name] have been completed by Lindsay Lemons, CMA based on the hand written [name] documentation sheet completed by Tammy Scott, who administered the medication.  

## 2018-09-15 NOTE — Progress Notes (Signed)
Cardiology Office Note   Date:  09/16/2018   ID:  [name] Lane, DOB 04/08/1941, MRN [id number]  PCP:  Quentin CornwallFriedman, Roy J, MD  Cardiologist:  Dr. Tresa EndoKelly  Chief Complaint  Patient presents with  . Medical Clearance  . Coronary Artery Disease     History of Present Illness: [name] Lane is a 77 y.o. female who presents for ongoing assessment and management of CAD, with prior CABG, BMS to the RCA and LAD in 2003, PTCA for in-stent restenosis in 2003. Cardiac cath in 2015 revealed severe 2 vessel disease with 95% proximal diagonal stenosis which was determined to be the culprit lesion for recurrent chest pain. She also had 80% RCA stenosis. She had a staged intervention to her proximal RCA with DES stent. Repeat cardiac cath in 01/2014 in the setting of recurrent chest pain, revealed a posterior lateral 3 lesion but was too small for intervention and therefore medical therapy was recommended.   Other history includes a CVA with residual left eye blindness, a broken left foot with hospitalization in 09/2016, OSA on CPAP. She was last seen by Dr. Tresa EndoKelly on 12/10/2017 and was doing well. She was to be see in one year.   She is here today for pre-operative evaluation to have bilateral nerve rood block by Dr. Vira AgarVandernoord on date to be determined. .   She is without cardiac complaints. Continues to have exertional dyspnea especially with climbing stairs. She denies bleeding, epistaxis or bruising from anticoagulants.   Past Medical History:  Diagnosis Date  . Anxiety   . CAD (coronary artery disease)    a. 2003 BMS to RCA & LAD; PTCA of ISR 2003 b.12/2013 - staged DES PCI of the D1 and mRCA. c. 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  . Carotid bruit   . CKD (chronic kidney disease), stage III (HCC)    a. Stage III-IV.  Marland Kitchen. COPD (chronic obstructive pulmonary disease) (HCC)   . Edema   . GE reflux   . HLD (hyperlipidemia)   . Hypertension   .  Mild aortic stenosis    a. By cath 04/09/2014.  Marland Kitchen. Obstructive sleep apnea   . Retroperitoneal bleed    a. Hx of this on lovenox in 2001 (had been admitted for NSTEMI).  . Type 2 diabetes mellitus treated with insulin (HCC)   . Unspecified asthma(493.90)     Past Surgical History:  Procedure Laterality Date  . APPENDECTOMY    . CARDIAC CATHETERIZATION  11/18/1999   Small non-STEMI; moderate 50-60% RCA lesion treated medically secondary to percutaneous RP bleed  . CARDIAC CATHETERIZATION  April 2004, October 2010, December 2011   Stable CAD with mild ISR in LAD and RCA; treated medically  . CARDIAC CATHETERIZATION  01/04/2012   4 small non-STEMI; Myoview with lateral ischemia-- 95% proximal D1, 80% mid RCA post stent -- staged PCI   . CORONARY ANGIOPLASTY   04/29/2002   Cutting Balloon PTCA of LAD stent for ISR with 2.5 mm balloon  . LEFT HEART CATHETERIZATION WITH CORONARY ANGIOGRAM N/A 01/02/2014   Procedure: LEFT HEART CATHETERIZATION WITH CORONARY ANGIOGRAM;  Surgeon: Marykay Lexavid W Harding, MD;  [facility];  Service: Cardiovascular;  Laterality: N/A;  . LEFT HEART CATHETERIZATION WITH CORONARY ANGIOGRAM N/A 04/09/2014   Procedure: LEFT HEART CATHETERIZATION WITH CORONARY ANGIOGRAM;  Surgeon: Marykay Lexavid W Harding, MD;  [facility];  Service: Cardiovascular;  Laterality: N/A;  . NM MYOVIEW LTD  01/01/2014   EF 64%. The distal anterior/anterolateral ischemia -- referred for cath  . PERCUTANEOUS CORONARY STENT INTERVENTION (PCI-S)  02/17/2002   Two-vessel PCI: 80% mid LAD -- 2.25 mm x 8 mm Express 2 BMS; in RCA RCA 80% 3.5 mm x 12 mm Express 2 BMS  . PERCUTANEOUS CORONARY STENT INTERVENTION (PCI-S)   03/07 & 06/2012   7th - Two-vessel PCI: 80% mid LAD -- 2.25 mm x 8 mm Express 2 BMS; in RCA RCA 80% 3.5 mm x 12 mm Express 2 BMS; 9th - mid RCA a Xience alpine DES 3.5 mm x 15 mm (postdilated 3.5 mm)   . PERCUTANEOUS CORONARY STENT INTERVENTION (PCI-S) N/A 01/06/2014   Procedure:  PERCUTANEOUS CORONARY STENT INTERVENTION (PCI-S);  Surgeon: Marykay Lex, MD;  Location: [facility] CATH LAB;  Service: Cardiovascular;  Laterality: N/A;  . TRANSTHORACIC ECHOCARDIOGRAM   01/02/2014   (Most recent) moderate concentric LVH. Normal EF of 65-70%. Grade 1 diastolic dysfunction. Mild AI     Current Outpatient Medications  Medication Sig Dispense Refill  . acetaminophen (TYLENOL) 325 MG tablet Take 325-650 mg by mouth every 6 (six) hours as needed for moderate pain.     Marland Kitchen albuterol (PROVENTIL HFA;VENTOLIN HFA) 108 (90 BASE) MCG/ACT inhaler Inhale 2 puffs into the lungs every 6 (six) hours as needed for wheezing or shortness of breath.    Marland Kitchen albuterol (PROVENTIL) (2.5 MG/3ML) 0.083% nebulizer solution Take 2.5 mg by nebulization every 6 (six) hours as needed for wheezing or shortness of breath.    . allopurinol (ZYLOPRIM) 100 MG tablet Take 100 mg by mouth daily.    Marland Kitchen aspirin 81 MG tablet Take 81 mg by mouth daily.      . budesonide (PULMICORT) 0.5 MG/2ML nebulizer solution Take 0.5 mg by nebulization 2 (two) times daily.    . cetirizine (ZYRTEC) 10 MG tablet Take 10 mg by mouth daily.      Marland Kitchen DALIRESP 500 MCG TABS tablet TAKE 1 TABLET BY MOUTH EVERY DAY 30 tablet 0  . FLUoxetine (PROZAC) 20 MG capsule Take 20 mg by mouth daily.  3  . Fluticasone-Umeclidin-Vilant (TRELEGY ELLIPTA) 100-62.5-25 MCG/INH AEPB Inhale 1 puff into the lungs daily. 1 each 0  . furosemide (LASIX) 20 MG tablet Take 20-40 mg by mouth See admin instructions. TYPICALLY TAKES 1 TAB DAILY, BUT CAN TAKE ADDT'L TAB AS NEEDED FOR SHORTNESS OF BREATH, FLUID BUILDUP    . gabapentin (NEURONTIN) 300 MG capsule Take 400 mg by mouth 2 (two) times daily.   0  . insulin glargine (LANTUS) 100 UNIT/ML injection Inject 30 Units into the skin at bedtime.     . insulin lispro (HUMALOG) 100 UNIT/ML injection Inject 0-12 Units into the skin 3 (three) times daily with meals. If BG less than 150, no insulin given 151-200, takes 3  units 201-250, takes 5 units  251-300, takes 8 units 301-350, takes 10 units 351-400, takes 12 units 400+, call MD    . isosorbide mononitrate (IMDUR) 30 MG 24 hr tablet TAKE 1 TABLET(30 MG) BY MOUTH DAILY 90 tablet 0  . LORazepam (ATIVAN) 1 MG tablet Take 1 mg by mouth 2 (two) times daily as needed for anxiety or sleep.     . metoprolol tartrate (LOPRESSOR) 25 MG tablet Take 1 tablet (25 mg total) by mouth 2 (two) times daily. 180 tablet 3  . nitroGLYCERIN (NITROSTAT) 0.4 MG SL tablet Place 1 tablet (0.4 mg total) under the tongue every 5 (five) minutes as needed for  chest pain. 25 tablet 6  . omeprazole (PRILOSEC) 40 MG capsule Take 1 capsule by mouth daily at 2 PM.  12  . potassium chloride (K-DUR) 10 MEQ tablet TAKE 1 TABLET BY MOUTH EVERY DAY. TAKE ADDTIONAL 1 TABLET ON DAYS YOU TAKE EXTRA LASIX 30 tablet 11  . prednisoLONE acetate (PRED FORTE) 1 % ophthalmic suspension Place 1 drop into both eyes 3 (three) times daily.  2  . predniSONE (DELTASONE) 5 MG tablet Increase Prednisone 10mg  daily for 1 week then 5mg  daily and hold at this dose 45 tablet 4  . ranitidine (ZANTAC) 150 MG tablet Take 150 mg by mouth 2 (two) times daily.     Marland Kitchen tetracaine (PONTOCAINE) 0.5 % ophthalmic solution Place 2 drops into the left eye every 6 (six) hours as needed (Pain). 2 mL 0  . ticagrelor (BRILINTA) 60 MG TABS tablet Take 1 tablet (60 mg total) by mouth 2 (two) times daily. 180 tablet 3  . tiotropium (SPIRIVA) 18 MCG inhalation capsule Place 1 capsule (18 mcg total) into inhaler and inhale daily. 30 capsule 1   Current Facility-Administered Medications  Medication Dose Route Frequency Provider Last Rate Last Dose  . omalizumab Geoffry Paradise) injection 375 mg  375 mg Subcutaneous Q14 Days Oretha Milch, MD   375 mg at 08/05/18 1645  . omalizumab Geoffry Paradise) injection 375 mg  375 mg Subcutaneous Q14 Days Oretha Milch, MD   375 mg at 08/26/18 1207  . omalizumab Geoffry Paradise) injection 375 mg  375 mg Subcutaneous Q14  Days Oretha Milch, MD   375 mg at 09/09/18 1443    Allergies:   Enalapril; Heparin; Morphine and related; Ace inhibitors; Acetaminophen; Atorvastatin; and Benzonatate    Social History:  The patient  reports that she quit smoking about 18 years ago. She has never used smokeless tobacco. She reports that she does not drink alcohol or use drugs.   Family History:  The patient's family history includes Breast cancer in her mother; Heart disease in her unknown relative.    ROS: All other systems are reviewed and negative. Unless otherwise mentioned in H&P    PHYSICAL EXAM: VS:  BP 129/73   Pulse 74   Ht 5\' 3"  (1.6 m)   Wt 211 lb 6.4 oz (95.9 kg)   BMI 37.45 kg/m  , BMI Body mass index is 37.45 kg/m. GEN: Well nourished, well developed, in no acute distress, sitting in a wheelchair  HEENT: normal Neck: no JVD, carotid bruits, or masses Cardiac: RRR; no murmurs, rubs, or gallops.  Respiratory:  Clear to auscultation bilaterally, normal work of breathing GI: soft, nontender, nondistended, + BS MS: no deformity or atrophy, diminished pulses in the LE, mild dependent edema. Skin: warm and dry, no rash Neuro:  Strength and sensation are intact Psych: euthymic mood, full affect   EKG:  NSR rate of 74 bpm. Non-specific ST and T wave abnormality.  Recent Labs: No results found for requested labs within last 8760 hours.    Lipid Panel    Component Value Date/Time   CHOL 186 02/08/2015 1345   TRIG 205 (H) 02/08/2015 1345   HDL 47 02/08/2015 1345   CHOLHDL 4.0 02/08/2015 1345   VLDL 41 (H) 02/08/2015 1345   LDLCALC 98 02/08/2015 1345   LDLDIRECT 136.1 11/02/2010 1300      Wt Readings from Last 3 Encounters:  09/16/18 211 lb 6.4 oz (95.9 kg)  07/18/18 204 lb (92.5 kg)  01/07/18 186 lb 3.2 oz (84.5  kg)      Other studies Reviewed: Echocardiogram 01-12-18  Left ventricle: The cavity size was normal. There was moderate concentric hypertrophy. Systolic function  was vigorous. The estimated ejection fraction was in the range of 65% to 70%. Wall motion was normal; there were no regional wall motion abnormalities. Doppler parameters are consistent with abnormal left ventricular relaxation (grade 1 diastolic dysfunction). The E/e' ratio is >15, suggesting normal LV filling pressure. - Aortic valve: Trileaflet; mildly calcified leaflets. There was no stenosis. Mild regurgitation. - Mitral valve: Mildly thickened leaflets . Trivial regurgitation. - Left atrium: The atrium was normal in size. - Atrial septum: No defect or patent foramen ovale was identified. - Inferior vena cava: The vessel was normal in size; the respirophasic diameter changes were in the normal range (= 50%); findings are consistent with normal central venous pressure. - Pericardium, extracardiac: There was no pericardial effusion.  NM Stress Test 01/01/2014 IMPRESSION: This is interpreted as an abnormal stress Myoview study. There is evidence of ischemia in the distal anterior lateral wall. On the raw data images I do not see a breast shadow to account for this anterolateral attenuation. Her left ventricular systolic function is well-preserved. Ejection fraction is calculated to be 64% but may in fact be greater than 64%.  ASSESSMENT AND PLAN:  1. Pre-Operative Cardiac Evaluation: She is asymptomatic from cardiac standpoint.    Chart reviewed as part of pre-operative protocol coverage. Given past medical history and time since last visit, based on ACC/AHA guidelines, [name] Lane would be at acceptable risk for the planned procedure without further cardiovascular testing. She is to stop Brilinta for 5 days prior to procedure and start back ASAP.   2. CAD: Hx of CABG, with PCI and stents to the RCA X 2, LAD. She is on DAPT with Brilinta 60 mg BID and ASA. She can stop the Brilinta for 5 days prior to nerve block procedure but start ASAP following it.  Continue nitrates and metoprolol without ceasing. I do not see that she is on lipid control medication. Will need to drawn labs on follow up.   3. COPD: She is followed by Dr. Vassie Loll for management.   Current medicines are reviewed at length with the patient today.    Labs/ tests ordered today include: None  Bettey Mare. Liborio Nixon, ANP, AACC   09/16/2018 11:30 AM    [facility] [address] Office [phone] Fax [phone]

## 2018-09-16 ENCOUNTER — Encounter: Payer: Self-pay | Admitting: Adult Health

## 2018-09-16 ENCOUNTER — Ambulatory Visit (INDEPENDENT_AMBULATORY_CARE_PROVIDER_SITE_OTHER): Payer: Medicare Other | Admitting: Adult Health

## 2018-09-16 VITALS — BP 129/73 | HR 74 | Ht 63.0 in | Wt 211.4 lb

## 2018-09-16 DIAGNOSIS — I639 Cerebral infarction, unspecified: Secondary | ICD-10-CM

## 2018-09-16 DIAGNOSIS — Z01810 Encounter for preprocedural cardiovascular examination: Secondary | ICD-10-CM

## 2018-09-16 DIAGNOSIS — I2510 Atherosclerotic heart disease of native coronary artery without angina pectoris: Secondary | ICD-10-CM

## 2018-09-16 NOTE — Patient Instructions (Signed)
Special Instructions: CLEARED FOR INJECTION  Follow-Up: You will need a follow up appointment in 6 MONTHS.  Please call our office 2 months in advance(MAR 2020) to schedule the (MAY 2020) appointment.  You may see  DR Richelle ItoKELLY, Kathryn Lawrence, DNP, AACC -or- one of the following Advanced Practice Providers on your designated Care Team:    . Azalee CourseHao Meng, PA-C . Micah FlesherAngela Duke, PA-C  Medication Instructions:  NO CHANGES- Your physician recommends that you continue on your current medications as directed. Please refer to the Current Medication list given to you today.  If you need a refill on your cardiac medications before your next appointment, please call your pharmacy.  Labwork: If you have labs (blood work) drawn today and your tests are completely normal, you will receive your results ONLY by: . MyChart Message (if you have MyChart) -OR- . A paper copy in the mail  At [facility], you and your health needs are our priority.  As part of our continuing mission to provide you with exceptional heart care, we have created designated Provider Care Teams.  These Care Teams include your primary Cardiologist (physician) and Advanced Practice Providers (APPs -  Physician Assistants and Nurse Practitioners) who all work together to provide you with the care you need, when you need it.  Thank you for choosing [facility]!!

## 2018-09-23 ENCOUNTER — Telehealth: Payer: Self-pay | Admitting: Pulmonary Disease

## 2018-09-23 ENCOUNTER — Ambulatory Visit (INDEPENDENT_AMBULATORY_CARE_PROVIDER_SITE_OTHER): Payer: Medicare Other

## 2018-09-23 DIAGNOSIS — J4540 Moderate persistent asthma, uncomplicated: Secondary | ICD-10-CM | POA: Diagnosis not present

## 2018-09-23 MED ORDER — OMALIZUMAB 150 MG ~~LOC~~ SOLR
375.0000 mg | SUBCUTANEOUS | Status: DC
  Administered 2018-09-23 – 2018-10-21 (×3): 375 mg via SUBCUTANEOUS

## 2018-09-23 NOTE — Progress Notes (Signed)
Documentation of medication administration and charges of [name] have been completed by Nadra Hritz, CMA based on the hand written [name] documentation sheet completed by Tammy Scott, who administered the medication.  

## 2018-09-23 NOTE — Telephone Encounter (Signed)
Prefilled Syringes: # 150mg  4  #75mg  2 Arrival Date: 09/23/18 Lot #: 150mg  [id number]      75mg  [id number] Exp Date: 150mg  02/2019   75mg  12/2018 There was a mix up with the address and the order in general.

## 2018-10-07 ENCOUNTER — Ambulatory Visit (INDEPENDENT_AMBULATORY_CARE_PROVIDER_SITE_OTHER): Payer: Medicare Other

## 2018-10-07 DIAGNOSIS — J4540 Moderate persistent asthma, uncomplicated: Secondary | ICD-10-CM | POA: Diagnosis not present

## 2018-10-08 DIAGNOSIS — J4540 Moderate persistent asthma, uncomplicated: Secondary | ICD-10-CM

## 2018-10-08 NOTE — Progress Notes (Signed)
Documented by Demarkus Remmel CMA based on hand-written [name] documentation sheet completed by Tammy Scott CMA, who administered the medication.  

## 2018-10-21 ENCOUNTER — Ambulatory Visit (INDEPENDENT_AMBULATORY_CARE_PROVIDER_SITE_OTHER): Payer: Medicare Other

## 2018-10-21 DIAGNOSIS — J4540 Moderate persistent asthma, uncomplicated: Secondary | ICD-10-CM | POA: Diagnosis not present

## 2018-10-21 NOTE — Progress Notes (Signed)
Documented by Odella Appelhans CMA based on hand-written [name] documentation sheet completed by Tammy Scott CMA, who administered the medication.  

## 2018-11-04 ENCOUNTER — Ambulatory Visit (INDEPENDENT_AMBULATORY_CARE_PROVIDER_SITE_OTHER): Payer: Medicare Other

## 2018-11-04 DIAGNOSIS — J4540 Moderate persistent asthma, uncomplicated: Secondary | ICD-10-CM | POA: Diagnosis not present

## 2018-11-04 MED ORDER — OMALIZUMAB 150 MG ~~LOC~~ SOLR
375.0000 mg | Freq: Once | SUBCUTANEOUS | Status: AC
  Administered 2018-11-04: 375 mg via SUBCUTANEOUS

## 2018-11-13 ENCOUNTER — Telehealth: Payer: Self-pay | Admitting: Pulmonary Disease

## 2018-11-14 ENCOUNTER — Telehealth: Payer: Self-pay | Admitting: Pulmonary Disease

## 2018-11-14 NOTE — Telephone Encounter (Signed)
Prefilled Syringe: #150mg  4  #75mg  2 Ordered Date: 11/13/2018 Shipping Date: 11/13/2018

## 2018-11-14 NOTE — Telephone Encounter (Signed)
Prefilled Syringes: # 150mg  4  #75mg  2 Arrival Date: 11/14/2018 Lot #: 150mg  [id number]      75mg  [id number] Exp Date: 150mg  03/2019   75mg  04/2019

## 2018-11-14 NOTE — Telephone Encounter (Signed)
Created error.

## 2018-11-18 ENCOUNTER — Ambulatory Visit (INDEPENDENT_AMBULATORY_CARE_PROVIDER_SITE_OTHER): Payer: Medicare Other

## 2018-11-18 DIAGNOSIS — J4551 Severe persistent asthma with (acute) exacerbation: Secondary | ICD-10-CM | POA: Diagnosis not present

## 2018-11-20 MED ORDER — OMALIZUMAB 150 MG ~~LOC~~ SOLR
150.0000 mg | Freq: Once | SUBCUTANEOUS | Status: AC
  Administered 2018-11-20: 150 mg via SUBCUTANEOUS

## 2018-12-02 ENCOUNTER — Ambulatory Visit (INDEPENDENT_AMBULATORY_CARE_PROVIDER_SITE_OTHER): Payer: Medicare Other

## 2018-12-02 DIAGNOSIS — J4540 Moderate persistent asthma, uncomplicated: Secondary | ICD-10-CM | POA: Diagnosis not present

## 2018-12-04 MED ORDER — OMALIZUMAB 150 MG ~~LOC~~ SOLR
375.0000 mg | Freq: Once | SUBCUTANEOUS | Status: AC
  Administered 2018-12-02: 375 mg via SUBCUTANEOUS

## 2018-12-09 ENCOUNTER — Telehealth: Payer: Self-pay | Admitting: Pulmonary Disease

## 2018-12-09 NOTE — Telephone Encounter (Signed)
Prefilled Syringe: #150mg  4  #75mg  2 Ordered Date: 2.10.2020 Shipping Date: 2.12.2020

## 2018-12-12 NOTE — Telephone Encounter (Signed)
Prefilled Syringes: # 150mg  4  #75mg  2 Arrival Date: 12/12/2018 Lot #: 150mg  [id number]      75mg  [id number] Exp Date: 150mg  09/2019   75mg  04/2019

## 2018-12-16 ENCOUNTER — Ambulatory Visit (INDEPENDENT_AMBULATORY_CARE_PROVIDER_SITE_OTHER): Payer: Medicare Other

## 2018-12-16 DIAGNOSIS — J4540 Moderate persistent asthma, uncomplicated: Secondary | ICD-10-CM

## 2018-12-17 MED ORDER — OMALIZUMAB 150 MG ~~LOC~~ SOLR
375.0000 mg | SUBCUTANEOUS | Status: AC
  Administered 2018-12-16: 375 mg via SUBCUTANEOUS
  Administered 2019-02-14: 17:00:00 300 mg via SUBCUTANEOUS

## 2018-12-17 NOTE — Progress Notes (Signed)
[name] injection documentation and charges entered by Ashley Caulfield, RMA, based on injection sheet filled out by Tammy Scott during preparation and administration. This documentation process is due to office requirements.   

## 2018-12-27 ENCOUNTER — Other Ambulatory Visit: Payer: Self-pay | Admitting: Cardiovascular Disease

## 2018-12-27 NOTE — Telephone Encounter (Signed)
Rx(s) sent to pharmacy electronically.  

## 2018-12-30 ENCOUNTER — Ambulatory Visit (INDEPENDENT_AMBULATORY_CARE_PROVIDER_SITE_OTHER): Payer: Medicare Other

## 2018-12-30 DIAGNOSIS — J4551 Severe persistent asthma with (acute) exacerbation: Secondary | ICD-10-CM

## 2018-12-30 MED ORDER — OMALIZUMAB 150 MG ~~LOC~~ SOLR
375.0000 mg | Freq: Once | SUBCUTANEOUS | Status: AC
  Administered 2018-12-30: 375 mg via SUBCUTANEOUS

## 2018-12-31 MED ORDER — OMALIZUMAB 150 MG ~~LOC~~ SOLR
375.0000 mg | Freq: Once | SUBCUTANEOUS | Status: AC
  Administered 2018-11-18: 375 mg via SUBCUTANEOUS

## 2018-12-31 NOTE — Addendum Note (Signed)
Addended by: Dimas Millin B on: 12/31/2018 04:22 PM   Modules accepted: Orders

## 2019-01-13 ENCOUNTER — Ambulatory Visit: Payer: Medicare Other

## 2019-01-13 ENCOUNTER — Telehealth: Payer: Self-pay | Admitting: Pulmonary Disease

## 2019-01-13 NOTE — Telephone Encounter (Signed)
Prefilled Syringe: #150mg 4  #75mg 2 Ordered Date: 01/13/2019 Shipping Date: 01/13/2019 

## 2019-01-14 NOTE — Telephone Encounter (Signed)
Prefilled Syringes: # 150mg 4  #75mg 2 Arrival Date: 01/14/2019 Lot #: 150mg [id number]      75mg [id number] Exp Date: 150mg 12/2019   75mg 09/2019  

## 2019-01-17 ENCOUNTER — Telehealth: Payer: Self-pay | Admitting: Adult Health

## 2019-01-17 ENCOUNTER — Ambulatory Visit (INDEPENDENT_AMBULATORY_CARE_PROVIDER_SITE_OTHER): Payer: Medicare Other | Admitting: Adult Health

## 2019-01-17 ENCOUNTER — Encounter: Payer: Self-pay | Admitting: Adult Health

## 2019-01-17 ENCOUNTER — Other Ambulatory Visit: Payer: Self-pay

## 2019-01-17 ENCOUNTER — Ambulatory Visit (INDEPENDENT_AMBULATORY_CARE_PROVIDER_SITE_OTHER): Payer: Medicare Other

## 2019-01-17 VITALS — BP 128/72 | HR 80 | Ht 63.0 in | Wt 210.6 lb

## 2019-01-17 DIAGNOSIS — J449 Chronic obstructive pulmonary disease, unspecified: Secondary | ICD-10-CM

## 2019-01-17 DIAGNOSIS — J4551 Severe persistent asthma with (acute) exacerbation: Secondary | ICD-10-CM

## 2019-01-17 MED ORDER — FLUTTER DEVI: 1.0000 | Freq: Three times a day (TID) | 0 refills | Status: AC

## 2019-01-17 MED ORDER — OMALIZUMAB 150 MG ~~LOC~~ SOLR
375.0000 mg | Freq: Once | SUBCUTANEOUS | Status: AC
  Administered 2019-01-17: 375 mg via SUBCUTANEOUS

## 2019-01-17 NOTE — Progress Notes (Signed)
  ID: [name] Lane, female    DOB: [birth year], 78 y.o.   MRN: [id number]  Chief Complaint  Patient presents with  . Follow-up    Asthma     Referring provider: Quentin Cornwall, MD  HPI: 78 yo female former smoker followed for COPD/Asthma  Steroid dependent since 2011. 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 Triggers being activity, perfumes, spring allergies (claritin helps) , cold weather etc.  She hassevere anxiety &agoraphobia, on ativan  ON spiriva since june 2011with improved exercise tolerance  Has a dog &bird- cockatoo, clean cage.   Significant tests/ events Spirometry6/2018>>FEv156% ,improved from 48% in the past RAST - IGE 636, +house dust, mildly pos for cat &dog dander  PSG -Moderate OSA - on CPAP 12 cm  02/2011 >>started daliresp   01/17/2019 Follow up : COPD /Asthma Patient presents for a 6 month follow up .  Patient is followed for COPD and severe asthma. Since last visit patient says she has been doing okay , has good and bad days . Gets winded easily . Did have cold like symptoms 1 month ago, seen by PCP given abx and steroids which helped a lot. Does feel cough at times is hard to get up mucus .  Remains on chronic steroids currently with prednisone at 5 mg daily. Last visit she was changed from Pulmicort and Spiriva to Trelegy.  She said that she did not like Trelegy and is back on her previous regimen of Pulmicort and Spiriva.  . She is on Zyrtec 10 mg daily along with Daliresp 500 mcg daily.  [name] injections every 2 weeks Would like to get her [name] injection today. She has moved to [place] with her son.  But prefers to keep her pulmonary visits in [place]. He is with her today . She is under a lot of stress, one of her sons died last week.  Denies any chest pain orthopnea PND .   Allergies  Allergen Reactions  . Enalapril Swelling    Per patient's son, her  mouth and tongue became swollen.   . Heparin Other (See Comments)    PATIENT ALMOST DIED FROM SIGNIFICANT BLEEDING EVENT, WAS BROUGHT TO [facility] ED AS A RESULT (2001) Other reaction(s): OTHER Other reaction(s): Bleeding, Other PATIENT ALMOST DIED FROM SIGNIFICANT BLEEDING EVENT, WAS BROUGHT TO [facility] ED AS A RESULT (2001)  Other reaction(s): OTHER  . Morphine And Related Other (See Comments)    CAUSED SIGNIFICANT HYPOTENSION AND PATIENT CRASHED AND NEEDED INSTANT INTERVENTION Other reaction(s): Hypotension, Other Other reaction(s): OTHER CAUSED SIGNIFICANT HYPOTENSION AND PATIENT CRASHED AND NEEDED INSTANT INTERVENTION Other reaction(s): OTHER Hypotension   . Ace Inhibitors Swelling  . Acetaminophen     Other reaction(s): Other Excessively sleepy, fatigue  . Atorvastatin Other (See Comments)    REACTION: muscle aches Other reaction(s): Other Other reaction(s): MUSCLE PAIN REACTION: muscle aches Other reaction(s): MUSCLE PAIN   . Benzonatate Other (See Comments)    REACTION: confusion Other reaction(s): Other REACTION: confusion    Immunization History  Administered Date(s) Administered  . Influenza Split 08/25/2011, 07/30/2013  . Influenza Whole 07/30/2009, 09/07/2010, 08/28/2012, 08/21/2017  . Influenza, High Dose Seasonal PF 10/17/2016, 07/18/2018  . Influenza, Seasonal, Injecte, Preservative Fre 08/30/2017  . Influenza,inj,Quad PF,6+ Mos 09/03/2015, 07/18/2018  . Pneumococcal Conjugate-13 08/12/2018    Past Medical History:  Diagnosis Date  . Anxiety   . CAD (coronary artery disease)    a.  2003 BMS to RCA & LAD; PTCA of ISR 2003 b.12/2013 - staged DES PCI of the D1 and mRCA. c. 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  . Carotid bruit   . CKD (chronic kidney disease), stage III (HCC)    a. Stage III-IV.  Marland Kitchen COPD (chronic obstructive pulmonary disease) (HCC)   . Edema   . GE reflux   . HLD (hyperlipidemia)   .  Hypertension   . Mild aortic stenosis    a. By cath 04/09/2014.  Marland Kitchen Obstructive sleep apnea   . Retroperitoneal bleed    a. Hx of this on lovenox in 2001 (had been admitted for NSTEMI).  . Type 2 diabetes mellitus treated with insulin (HCC)   . Unspecified asthma(493.90)     Tobacco History: Social History   Tobacco Use  Smoking Status Former Smoker  . Last attempt to quit: 10/31/1999  . Years since quitting: 19.2  Smokeless Tobacco Never Used   Counseling given: Not Answered   Outpatient Medications Prior to Visit  Medication Sig Dispense Refill  . acetaminophen (TYLENOL) 325 MG tablet Take 325-650 mg by mouth every 6 (six) hours as needed for moderate pain.     Marland Kitchen albuterol (PROVENTIL HFA;VENTOLIN HFA) 108 (90 BASE) MCG/ACT inhaler Inhale 2 puffs into the lungs every 6 (six) hours as needed for wheezing or shortness of breath.    Marland Kitchen albuterol (PROVENTIL) (2.5 MG/3ML) 0.083% nebulizer solution Take 2.5 mg by nebulization every 6 (six) hours as needed for wheezing or shortness of breath.    . allopurinol (ZYLOPRIM) 100 MG tablet Take 100 mg by mouth daily.    Marland Kitchen aspirin 81 MG tablet Take 81 mg by mouth daily.      . budesonide (PULMICORT) 0.5 MG/2ML nebulizer solution Take 0.5 mg by nebulization 2 (two) times daily.    . cetirizine (ZYRTEC) 10 MG tablet Take 10 mg by mouth daily.      Marland Kitchen DALIRESP 500 MCG TABS tablet TAKE 1 TABLET BY MOUTH EVERY DAY 30 tablet 0  . FLUoxetine (PROZAC) 20 MG capsule Take 20 mg by mouth daily.  3  . furosemide (LASIX) 20 MG tablet Take 20-40 mg by mouth See admin instructions. TYPICALLY TAKES 1 TAB DAILY, BUT CAN TAKE ADDT'L TAB AS NEEDED FOR SHORTNESS OF BREATH, FLUID BUILDUP    . gabapentin (NEURONTIN) 300 MG capsule Take 400 mg by mouth 2 (two) times daily.   0  . insulin glargine (LANTUS) 100 UNIT/ML injection Inject 30 Units into the skin at bedtime.     . insulin lispro (HUMALOG) 100 UNIT/ML injection Inject 0-12 Units into the skin 3 (three) times  daily with meals. If BG less than 150, no insulin given 151-200, takes 3 units 201-250, takes 5 units  251-300, takes 8 units 301-350, takes 10 units 351-400, takes 12 units 400+, call MD    . isosorbide mononitrate (IMDUR) 30 MG 24 hr tablet TAKE 1 TABLET(30 MG) BY MOUTH DAILY 90 tablet 0  . LORazepam (ATIVAN) 1 MG tablet Take 1 mg by mouth 2 (two) times daily as needed for anxiety or sleep.     . metoprolol tartrate (LOPRESSOR) 25 MG tablet Take 1 tablet (25 mg total) by mouth 2 (two) times daily. 180 tablet 3  . nitroGLYCERIN (NITROSTAT) 0.4 MG SL tablet Place 1 tablet (0.4 mg total) under the tongue every 5 (five) minutes as needed for chest pain. 25 tablet 6  . omeprazole (  PRILOSEC) 40 MG capsule Take 1 capsule by mouth daily at 2 PM.  12  . potassium chloride (K-DUR) 10 MEQ tablet TAKE 1 TABLET BY MOUTH EVERY DAY. TAKE ADDTIONAL 1 TABLET ON DAYS YOU TAKE EXTRA LASIX 30 tablet 11  . prednisoLONE acetate (PRED FORTE) 1 % ophthalmic suspension Place 1 drop into both eyes 3 (three) times daily.  2  . predniSONE (DELTASONE) 5 MG tablet Increase Prednisone 10mg  daily for 1 week then 5mg  daily and hold at this dose 45 tablet 4  . ranitidine (ZANTAC) 150 MG tablet Take 150 mg by mouth 2 (two) times daily.     Marland Kitchen tetracaine (PONTOCAINE) 0.5 % ophthalmic solution Place 2 drops into the left eye every 6 (six) hours as needed (Pain). 2 mL 0  . ticagrelor (BRILINTA) 60 MG TABS tablet Take 1 tablet (60 mg total) by mouth 2 (two) times daily. 180 tablet 2  . tiotropium (SPIRIVA) 18 MCG inhalation capsule Place 1 capsule (18 mcg total) into inhaler and inhale daily. 30 capsule 1  . Fluticasone-Umeclidin-Vilant (TRELEGY ELLIPTA) 100-62.5-25 MCG/INH AEPB Inhale 1 puff into the lungs daily. (Patient not taking: Reported on 01/17/2019) 1 each 0   Facility-Administered Medications Prior to Visit  Medication Dose Route Frequency Provider Last Rate Last Dose  . omalizumab Geoffry Paradise) injection 375 mg  375 mg  Subcutaneous Q14 Days Oretha Milch, MD   375 mg at 12/16/18 1648     Review of Systems:   Constitutional:   No  weight loss, night sweats,  Fevers, chills, + fatigue, or  lassitude.  HEENT:   No headaches,  Difficulty swallowing,  Tooth/dental problems, or  Sore throat,                No sneezing, itching, ear ache,  +nasal congestion, post nasal drip,   CV:  No chest pain,  Orthopnea, PND, swelling in lower extremities, anasarca, dizziness, palpitations, syncope.   GI  No heartburn, indigestion, abdominal pain, nausea, vomiting, diarrhea, change in bowel habits, loss of appetite, bloody stools.   Resp:   No chest wall deformity  Skin: no rash or lesions.  GU: no dysuria, change in color of urine, no urgency or frequency.  No flank pain, no hematuria   MS:  No joint pain or swelling.  No decreased range of motion.  No back pain.    Physical Exam  BP 128/72 (BP Location: Right Arm, Patient Position: Sitting, Cuff Size: Normal)   Pulse 80   Ht 5\' 3"  (1.6 m)   Wt 210 lb 9.6 oz (95.5 kg)   SpO2 98%   BMI 37.31 kg/m   GEN: A/Ox3; pleasant , NAD , elderly    HEENT:  [place]/AT,  EACs-clear, TMs-wnl, NOSE-clear, THROAT-clear, no lesions, no postnasal drip or exudate noted.   NECK:  Supple w/ fair ROM; no JVD; normal carotid impulses w/o bruits; no thyromegaly or nodules palpated; no lymphadenopathy.    RESP  Decreased BS in bases   no accessory muscle use, no dullness to percussion  CARD:  RRR, no m/r/g, tr  peripheral edema, pulses intact, no cyanosis or clubbing.  GI:   Soft & nt; nml bowel sounds; no organomegaly or masses detected.   Musco: Warm bil, no deformities or joint swelling noted.   Neuro: alert, no focal deficits noted.    Skin: Warm, no lesions or rashes    Lab Results:   BMET  BNP No results found for: BNP  ProBNP  Imaging: No  results found.  omalizumab Geoffry Paradise) injection 150 mg    Date Action Dose Route User   11/20/2018 1028 Given 150 mg  Subcutaneous (Other) Scott, Shalayne Leach B    omalizumab Geoffry Paradise) injection 375 mg    Date Action Dose Route User   12/02/2018 1422 Given 375 mg Subcutaneous (Other) Welchel, Katie C, CMA    omalizumab Geoffry Paradise) injection 375 mg    Date Action Dose Route User   12/16/2018 1648 Given by Other 375 mg Subcutaneous (Other) Velvet Bathe, CMA    omalizumab Geoffry Paradise) injection 375 mg    Date Action Dose Route User   12/30/2018 1147 Given 375 mg Subcutaneous (Other) Scott, Trinitee Horgan B    omalizumab Geoffry Paradise) injection 375 mg    Date Action Dose Route User   11/18/2018 1618 Given 375 mg Subcutaneous (Other) Scott, Laramie Meissner B      No flowsheet data found.  No results found for: NITRICOXIDE      Assessment & Plan:   Asthma exacerbation Recent flare now improving   Plan  Patient Instructions  Continue on Prednisone 5mg  daily .  Continue on Pulmicort Neb Twice daily   Continue on Spiriva daily  Continue Daliresp daily.  Continue [name] . -May get [name] today .  Mucinex DM Twice daily  As needed  Cough/congestion  Add Flutter valve Three times a day  .  Follow up with Dr. Vassie Loll  In 6 months and As needed   Please contact office for sooner follow up if symptoms do not improve or worsen or seek emergency care       COPD (chronic obstructive pulmonary disease) Add flutter valve   Plan  Patient Instructions  Continue on Prednisone 5mg  daily .  Continue on Pulmicort Neb Twice daily   Continue on Spiriva daily  Continue Daliresp daily.  Continue [name] . -May get [name] today .  Mucinex DM Twice daily  As needed  Cough/congestion  Add Flutter valve Three times a day  .  Follow up with Dr. Vassie Loll  In 6 months and As needed   Please contact office for sooner follow up if symptoms do not improve or worsen or seek emergency care          Rubye Oaks, NP 01/17/2019

## 2019-01-17 NOTE — Patient Instructions (Addendum)
Continue on Prednisone 5mg  daily .  Continue on Pulmicort Neb Twice daily   Continue on Spiriva daily  Continue Daliresp daily.  Continue [name] . -May get [name] today .  Mucinex DM Twice daily  As needed  Cough/congestion  Add Flutter valve Three times a day  .  Follow up with Dr. Vassie Loll  In 6 months and As needed   Please contact office for sooner follow up if symptoms do not improve or worsen or seek emergency care

## 2019-01-17 NOTE — Telephone Encounter (Signed)
Will route message over to [employer]

## 2019-01-17 NOTE — Assessment & Plan Note (Signed)
Recent flare now improving   Plan  Patient Instructions  Continue on Prednisone 5mg  daily .  Continue on Pulmicort Neb Twice daily   Continue on Spiriva daily  Continue Daliresp daily.  Continue [name] . -May get [name] today .  Mucinex DM Twice daily  As needed  Cough/congestion  Add Flutter valve Three times a day  .  Follow up with Dr. Vassie Loll  In 6 months and As needed   Please contact office for sooner follow up if symptoms do not improve or worsen or seek emergency care

## 2019-01-17 NOTE — Assessment & Plan Note (Signed)
Add flutter valve   Plan  Patient Instructions  Continue on Prednisone 5mg  daily .  Continue on Pulmicort Neb Twice daily   Continue on Spiriva daily  Continue Daliresp daily.  Continue [name] . -May get [name] today .  Mucinex DM Twice daily  As needed  Cough/congestion  Add Flutter valve Three times a day  .  Follow up with Dr. Vassie Loll  In 6 months and As needed   Please contact office for sooner follow up if symptoms do not improve or worsen or seek emergency care

## 2019-01-20 ENCOUNTER — Ambulatory Visit: Payer: Medicare Other

## 2019-01-20 NOTE — Telephone Encounter (Signed)
Patient has already been seen in the office 3.20.2020 by TP Nothing further needed, will sign off

## 2019-01-31 ENCOUNTER — Ambulatory Visit (INDEPENDENT_AMBULATORY_CARE_PROVIDER_SITE_OTHER): Payer: Medicare Other

## 2019-01-31 ENCOUNTER — Other Ambulatory Visit: Payer: Self-pay

## 2019-01-31 DIAGNOSIS — J4551 Severe persistent asthma with (acute) exacerbation: Secondary | ICD-10-CM

## 2019-01-31 MED ORDER — OMALIZUMAB 150 MG ~~LOC~~ SOLR
375.0000 mg | Freq: Once | SUBCUTANEOUS | Status: AC
  Administered 2019-01-31: 13:00:00 375 mg via SUBCUTANEOUS

## 2019-01-31 NOTE — Progress Notes (Signed)
Have you been hospitalized within the last 10 days?  No Do you have a fever?  No Do you have a cough?  No Do you have a headache or sore throat? No  

## 2019-02-03 ENCOUNTER — Telehealth: Payer: Self-pay | Admitting: Pulmonary Disease

## 2019-02-03 NOTE — Telephone Encounter (Signed)
Prefilled Syringe: #150mg  4  #75mg  2 Ordered Date: 02/03/2019 Shipping Date: 02/03/2019

## 2019-02-04 NOTE — Telephone Encounter (Signed)
Prefilled Syringes: # 150mg  4  #75mg  2 Arrival Date: 02/04/2019 Lot #: 150mg  [id number]      75mg  [id number] Exp Date: 150mg  12/2019   75mg  09/2019 [name] rc'd 02/04/2019

## 2019-02-11 MED ORDER — OMALIZUMAB 150 MG ~~LOC~~ SOLR
375.0000 mg | Freq: Once | SUBCUTANEOUS | Status: AC
  Administered 2019-01-31: 13:00:00 375 mg via SUBCUTANEOUS

## 2019-02-11 NOTE — Addendum Note (Signed)
Addended by: Dimas Millin B on: 02/11/2019 01:31 PM   Modules accepted: Orders

## 2019-02-14 ENCOUNTER — Other Ambulatory Visit: Payer: Self-pay

## 2019-02-14 ENCOUNTER — Ambulatory Visit (INDEPENDENT_AMBULATORY_CARE_PROVIDER_SITE_OTHER): Payer: Medicare Other

## 2019-02-14 DIAGNOSIS — J4551 Severe persistent asthma with (acute) exacerbation: Secondary | ICD-10-CM | POA: Diagnosis not present

## 2019-02-14 MED ORDER — OMALIZUMAB 150 MG/ML ~~LOC~~ SOSY
300.0000 mg | PREFILLED_SYRINGE | Freq: Once | SUBCUTANEOUS | Status: AC
  Administered 2019-02-14: 11:00:00 300 mg via SUBCUTANEOUS

## 2019-02-14 MED ORDER — OMALIZUMAB 75 MG/0.5ML ~~LOC~~ SOSY
75.0000 mg | PREFILLED_SYRINGE | Freq: Once | SUBCUTANEOUS | Status: AC
  Administered 2019-02-14: 11:00:00 75 mg via SUBCUTANEOUS

## 2019-02-14 NOTE — Progress Notes (Signed)
Have you been hospitalized within the last 10 days?  No Do you have a fever?  No Do you have a cough?  No Do you have a headache or sore throat? No   Billing code is not populating, therefore the charge for med is not generating.

## 2019-02-15 ENCOUNTER — Other Ambulatory Visit: Payer: Self-pay | Admitting: Adult Health

## 2019-02-25 MED ORDER — OMALIZUMAB 75 MG/0.5ML ~~LOC~~ SOSY
75.0000 mg | PREFILLED_SYRINGE | Freq: Once | SUBCUTANEOUS | Status: AC
  Administered 2019-02-25: 75 mg via SUBCUTANEOUS

## 2019-02-25 MED ORDER — OMALIZUMAB 150 MG/ML ~~LOC~~ SOSY
300.0000 mg | PREFILLED_SYRINGE | Freq: Once | SUBCUTANEOUS | Status: AC
Start: 2019-02-25 — End: 2019-02-14
  Administered 2019-02-14: 16:00:00 300 mg via SUBCUTANEOUS

## 2019-02-25 MED ORDER — OMALIZUMAB 150 MG/ML ~~LOC~~ SOSY
300.0000 mg | PREFILLED_SYRINGE | Freq: Once | SUBCUTANEOUS | Status: AC
Start: 2019-02-25 — End: 2019-02-14
  Administered 2019-02-14: 17:00:00 300 mg via SUBCUTANEOUS

## 2019-02-25 NOTE — Addendum Note (Signed)
Addended by: Dimas Millin B on: 02/25/2019 04:55 PM   Modules accepted: Orders

## 2019-02-28 ENCOUNTER — Ambulatory Visit (INDEPENDENT_AMBULATORY_CARE_PROVIDER_SITE_OTHER): Payer: Medicare Other

## 2019-02-28 ENCOUNTER — Other Ambulatory Visit: Payer: Self-pay

## 2019-02-28 DIAGNOSIS — J4551 Severe persistent asthma with (acute) exacerbation: Secondary | ICD-10-CM | POA: Diagnosis not present

## 2019-02-28 MED ORDER — OMALIZUMAB 150 MG ~~LOC~~ SOLR
375.0000 mg | SUBCUTANEOUS | Status: AC
  Administered 2019-02-28: 11:00:00 375 mg via SUBCUTANEOUS

## 2019-03-03 ENCOUNTER — Telehealth: Payer: Self-pay | Admitting: *Deleted

## 2019-03-03 NOTE — Telephone Encounter (Signed)
03/03/19 LMOM @ 12:24 pm.re: follow up appointment.

## 2019-03-14 ENCOUNTER — Ambulatory Visit (INDEPENDENT_AMBULATORY_CARE_PROVIDER_SITE_OTHER): Payer: Medicare Other

## 2019-03-14 ENCOUNTER — Other Ambulatory Visit: Payer: Self-pay

## 2019-03-14 DIAGNOSIS — J4551 Severe persistent asthma with (acute) exacerbation: Secondary | ICD-10-CM

## 2019-03-14 MED ORDER — OMALIZUMAB 150 MG ~~LOC~~ SOLR
375.0000 mg | SUBCUTANEOUS | Status: AC
  Administered 2019-03-14: 11:00:00 375 mg via SUBCUTANEOUS

## 2019-03-14 NOTE — Progress Notes (Signed)
Have you been hospitalized in the last 10 days? No Do you have a fever? No Do you have a cough? No Do you have a headache or sore throat? No  

## 2019-03-28 ENCOUNTER — Ambulatory Visit: Payer: Medicare Other

## 2019-03-31 ENCOUNTER — Ambulatory Visit (INDEPENDENT_AMBULATORY_CARE_PROVIDER_SITE_OTHER): Payer: Medicare Other

## 2019-03-31 ENCOUNTER — Other Ambulatory Visit: Payer: Self-pay

## 2019-03-31 DIAGNOSIS — J4551 Severe persistent asthma with (acute) exacerbation: Secondary | ICD-10-CM | POA: Diagnosis not present

## 2019-03-31 MED ORDER — OMALIZUMAB 150 MG ~~LOC~~ SOLR
375.0000 mg | Freq: Once | SUBCUTANEOUS | Status: AC
  Administered 2019-03-31: 375 mg via SUBCUTANEOUS

## 2019-03-31 NOTE — Progress Notes (Signed)
Have you been hospitalized within the last 10 days?  No Do you have a fever?  No Do you have a cough?  No Do you have a headache or sore throat? No  

## 2019-03-31 NOTE — Patient Instructions (Addendum)
[name] Lane came in on 03/31/2019 to receive a [name] injection. The patient is given 375mg  every 14 days. Due to each vial equalling 150mg , 75mg  of medication was wasted.  In [facility], in comments: I put the last 3 #s of the Lot # with the Exp date. I was going to edit it in [facility] but I couldn't b/c I created another encounter by mistake.

## 2019-04-14 ENCOUNTER — Ambulatory Visit (INDEPENDENT_AMBULATORY_CARE_PROVIDER_SITE_OTHER): Payer: Medicare Other

## 2019-04-14 ENCOUNTER — Other Ambulatory Visit: Payer: Self-pay

## 2019-04-14 DIAGNOSIS — J4551 Severe persistent asthma with (acute) exacerbation: Secondary | ICD-10-CM

## 2019-04-14 MED ORDER — OMALIZUMAB 150 MG/ML ~~LOC~~ SOSY: 150.0000 mg | PREFILLED_SYRINGE | Freq: Once | SUBCUTANEOUS | Status: DC

## 2019-04-14 MED ORDER — OMALIZUMAB 150 MG ~~LOC~~ SOLR
150.0000 mg | SUBCUTANEOUS | Status: AC
  Administered 2019-04-14: 150 mg via SUBCUTANEOUS

## 2019-04-14 NOTE — Progress Notes (Signed)
All questions were answered by the patient before medication was administered. Have you been hospitalized in the last 10 days? No Do you have a fever? No Do you have a cough? No Do you have a headache or sore throat? No  

## 2019-04-21 ENCOUNTER — Telehealth: Payer: Self-pay | Admitting: Pulmonary Disease

## 2019-04-21 NOTE — Telephone Encounter (Signed)
[name] Prefilled Syringe Order: 150mg Prefilled Syringe:  #4 75mg Prefilled Syringe: #2 Ordered Date: 04/21/2019 Expected date of arrival: 04/22/2019 Ordered by: Lindsay Lemons, CMA  Speciality Pharmacy: [facility]   

## 2019-04-22 NOTE — Telephone Encounter (Signed)
[name] Prefilled Syringe Received:  150mg  Prefilled Syringe >> quantity 4, lot # [id number], exp date 12/2019 75mg  Prefilled Syringe >> quantity 2, lot # [id number], exp date 01/2020 Medication arrival date: 04/22/2019 Received by: tbs

## 2019-04-28 ENCOUNTER — Ambulatory Visit (INDEPENDENT_AMBULATORY_CARE_PROVIDER_SITE_OTHER): Payer: Medicare Other

## 2019-04-28 ENCOUNTER — Other Ambulatory Visit: Payer: Self-pay

## 2019-04-28 DIAGNOSIS — J4551 Severe persistent asthma with (acute) exacerbation: Secondary | ICD-10-CM | POA: Diagnosis not present

## 2019-04-28 MED ORDER — OMALIZUMAB 150 MG/ML ~~LOC~~ SOSY
300.0000 mg | PREFILLED_SYRINGE | Freq: Once | SUBCUTANEOUS | Status: AC
  Administered 2019-04-28: 16:00:00 300 mg via SUBCUTANEOUS

## 2019-04-28 MED ORDER — OMALIZUMAB 75 MG/0.5ML ~~LOC~~ SOSY
75.0000 mg | PREFILLED_SYRINGE | Freq: Once | SUBCUTANEOUS | Status: AC
  Administered 2019-04-28: 75 mg via SUBCUTANEOUS

## 2019-04-28 NOTE — Progress Notes (Signed)
Have you been hospitalized within the last 10 days?  No Do you have a fever?  No Do you have a cough?  No Do you have a headache or sore throat? No  

## 2019-05-12 ENCOUNTER — Ambulatory Visit (INDEPENDENT_AMBULATORY_CARE_PROVIDER_SITE_OTHER): Payer: Medicare Other

## 2019-05-12 ENCOUNTER — Other Ambulatory Visit: Payer: Self-pay

## 2019-05-12 DIAGNOSIS — J4551 Severe persistent asthma with (acute) exacerbation: Secondary | ICD-10-CM

## 2019-05-12 MED ORDER — OMALIZUMAB 150 MG/ML ~~LOC~~ SOSY
300.0000 mg | PREFILLED_SYRINGE | Freq: Once | SUBCUTANEOUS | Status: AC
  Administered 2019-05-12: 300 mg via SUBCUTANEOUS

## 2019-05-12 MED ORDER — OMALIZUMAB 75 MG/0.5ML ~~LOC~~ SOSY
75.0000 mg | PREFILLED_SYRINGE | Freq: Once | SUBCUTANEOUS | Status: AC
  Administered 2019-05-12: 75 mg via SUBCUTANEOUS

## 2019-05-12 NOTE — Progress Notes (Signed)
Have you been hospitalized within the last 10 days?  No Do you have a fever?  No Do you have a cough?  No Do you have a headache or sore throat? No  

## 2019-05-26 ENCOUNTER — Other Ambulatory Visit: Payer: Self-pay

## 2019-05-26 ENCOUNTER — Ambulatory Visit (INDEPENDENT_AMBULATORY_CARE_PROVIDER_SITE_OTHER): Payer: Medicare Other

## 2019-05-26 DIAGNOSIS — J4551 Severe persistent asthma with (acute) exacerbation: Secondary | ICD-10-CM

## 2019-05-26 MED ORDER — OMALIZUMAB 150 MG ~~LOC~~ SOLR
375.0000 mg | SUBCUTANEOUS | Status: AC
  Administered 2019-05-26: 12:00:00 375 mg via SUBCUTANEOUS

## 2019-05-26 NOTE — Progress Notes (Signed)
All questions were answered by the patient before medication was administered. Have you been hospitalized in the last 10 days? No Do you have a fever? No Do you have a cough? No Do you have a headache or sore throat? No   Patient denies any problems from previous injection.

## 2019-06-02 ENCOUNTER — Telehealth: Payer: Self-pay | Admitting: Pulmonary Disease

## 2019-06-02 NOTE — Telephone Encounter (Signed)
[name] Prefilled Syringe Order: 150mg  Prefilled Syringe:  #4 75mg  Prefilled Syringe: #2 Ordered Date: 06/02/19  Expected date of arrival: 06/03/19  Ordered by: Parke Poisson, [place]: [facility]

## 2019-06-03 NOTE — Telephone Encounter (Signed)
[name] Prefilled Syringe Received:  150mg  Prefilled Syringe >> quantity 4, lot # [id number], exp date 02/28/2020 75mg  Prefilled Syringe >> quantity 2, lot # [id number], exp date 01/29/2020 Medication arrival date: 06/03/19 Received by: Tonna Corner

## 2019-06-09 ENCOUNTER — Other Ambulatory Visit: Payer: Self-pay

## 2019-06-09 ENCOUNTER — Ambulatory Visit (INDEPENDENT_AMBULATORY_CARE_PROVIDER_SITE_OTHER): Payer: Medicare Other

## 2019-06-09 DIAGNOSIS — J4551 Severe persistent asthma with (acute) exacerbation: Secondary | ICD-10-CM

## 2019-06-09 DIAGNOSIS — J4540 Moderate persistent asthma, uncomplicated: Secondary | ICD-10-CM

## 2019-06-09 MED ORDER — EPINEPHRINE 0.3 MG/0.3ML IJ SOAJ: 0.3000 mg | Freq: Once | INTRAMUSCULAR | 5 refills | Status: AC

## 2019-06-09 MED ORDER — OMALIZUMAB 75 MG/0.5ML ~~LOC~~ SOSY
75.0000 mg | PREFILLED_SYRINGE | Freq: Once | SUBCUTANEOUS | Status: AC
  Administered 2019-06-09: 75 mg via SUBCUTANEOUS

## 2019-06-09 MED ORDER — OMALIZUMAB 150 MG/ML ~~LOC~~ SOSY
300.0000 mg | PREFILLED_SYRINGE | Freq: Once | SUBCUTANEOUS | Status: AC
  Administered 2019-06-09: 300 mg via SUBCUTANEOUS

## 2019-06-09 MED ORDER — OMALIZUMAB 150 MG/ML ~~LOC~~ SOSY: 150.0000 mg | PREFILLED_SYRINGE | Freq: Once | SUBCUTANEOUS | Status: DC

## 2019-06-09 NOTE — Progress Notes (Signed)
Have you been hospitalized within the last 10 days?  No Do you have a fever?  No Do you have a cough?  No Do you have a headache or sore throat? No Do you have your Epi Pen visible and is it within date?  No   Epipen prescription sent to requested pharmacy, [place], [place].

## 2019-06-23 ENCOUNTER — Other Ambulatory Visit: Payer: Self-pay

## 2019-06-23 ENCOUNTER — Ambulatory Visit (INDEPENDENT_AMBULATORY_CARE_PROVIDER_SITE_OTHER): Payer: Medicare Other

## 2019-06-23 DIAGNOSIS — J4551 Severe persistent asthma with (acute) exacerbation: Secondary | ICD-10-CM

## 2019-06-23 MED ORDER — OMALIZUMAB 75 MG/0.5ML ~~LOC~~ SOSY
75.0000 mg | PREFILLED_SYRINGE | Freq: Once | SUBCUTANEOUS | Status: AC
  Administered 2019-06-23: 75 mg via SUBCUTANEOUS

## 2019-06-23 MED ORDER — OMALIZUMAB 150 MG/ML ~~LOC~~ SOSY
300.0000 mg | PREFILLED_SYRINGE | Freq: Once | SUBCUTANEOUS | Status: AC
  Administered 2019-06-23: 300 mg via SUBCUTANEOUS

## 2019-06-23 NOTE — Progress Notes (Signed)
All questions were answered by the patient before medication was administered. Have you been hospitalized in the last 10 days? No Do you have a fever? No Do you have a cough? No Do you have a headache or sore throat? No  

## 2019-06-30 ENCOUNTER — Telehealth: Payer: Self-pay | Admitting: Pulmonary Disease

## 2019-06-30 NOTE — Telephone Encounter (Signed)
[name] Prefilled Syringe Order: 150mg  Prefilled Syringe:  #4 75mg  Prefilled Syringe: #2 Ordered Date: 06/30/19 Expected date of arrival: 07/01/2019 Ordered by: Tonna Corner [facility]: [facility]

## 2019-07-01 NOTE — Telephone Encounter (Signed)
[name] Prefilled Syringe Received:  150mg  Prefilled Syringe >> quantity 4, lot # [id number], exp date 03/30/2020 75mg  Prefilled Syringe >> quantity 2, lot # [id number], exp date 02/28/2020 Medication arrival date: 07/01/19 Received by: Tonna Corner

## 2019-07-08 ENCOUNTER — Ambulatory Visit (INDEPENDENT_AMBULATORY_CARE_PROVIDER_SITE_OTHER): Payer: Medicare Other

## 2019-07-08 ENCOUNTER — Other Ambulatory Visit: Payer: Self-pay

## 2019-07-08 DIAGNOSIS — J4551 Severe persistent asthma with (acute) exacerbation: Secondary | ICD-10-CM | POA: Diagnosis not present

## 2019-07-08 MED ORDER — OMALIZUMAB 75 MG/0.5ML ~~LOC~~ SOSY
75.0000 mg | PREFILLED_SYRINGE | Freq: Once | SUBCUTANEOUS | Status: AC
  Administered 2019-07-08: 11:00:00 75 mg via SUBCUTANEOUS

## 2019-07-08 MED ORDER — OMALIZUMAB 150 MG/ML ~~LOC~~ SOSY
300.0000 mg | PREFILLED_SYRINGE | Freq: Once | SUBCUTANEOUS | Status: AC
  Administered 2019-07-08: 11:00:00 300 mg via SUBCUTANEOUS

## 2019-07-08 NOTE — Progress Notes (Signed)
All questions were answered by the patient before medication was administered. Have you been hospitalized in the last 10 days? No Do you have a fever? No Do you have a cough? No Do you have a headache or sore throat? No  

## 2019-07-22 ENCOUNTER — Other Ambulatory Visit: Payer: Self-pay

## 2019-07-22 ENCOUNTER — Ambulatory Visit (INDEPENDENT_AMBULATORY_CARE_PROVIDER_SITE_OTHER): Payer: Medicare Other

## 2019-07-22 DIAGNOSIS — J4551 Severe persistent asthma with (acute) exacerbation: Secondary | ICD-10-CM

## 2019-07-22 MED ORDER — OMALIZUMAB 75 MG/0.5ML ~~LOC~~ SOSY
75.0000 mg | PREFILLED_SYRINGE | Freq: Once | SUBCUTANEOUS | Status: AC
  Administered 2019-07-22: 75 mg via SUBCUTANEOUS

## 2019-07-22 MED ORDER — OMALIZUMAB 150 MG/ML ~~LOC~~ SOSY
300.0000 mg | PREFILLED_SYRINGE | Freq: Once | SUBCUTANEOUS | Status: AC
  Administered 2019-07-22: 12:00:00 300 mg via SUBCUTANEOUS

## 2019-07-22 NOTE — Progress Notes (Signed)
Have you been hospitalized within the last 10 days?  No Do you have a fever?  No Do you have a cough?  No Do you have a headache or sore throat? No Do you have your Epi Pen visible and is it within date?  Yes 

## 2019-07-28 ENCOUNTER — Telehealth: Payer: Self-pay | Admitting: Pulmonary Disease

## 2019-07-28 NOTE — Telephone Encounter (Signed)
[name] Prefilled Syringe Order: 150mg Prefilled Syringe:  #4 75mg Prefilled Syringe: #2 Ordered Date: 07/28/2019 Expected date of arrival: 07/29/2019 Ordered by: Adebayo Ensminger, CMA Specialty Pharmacy: [facility]   

## 2019-07-29 NOTE — Telephone Encounter (Signed)
[name] Prefilled Syringe Received:  150mg Prefilled Syringe >> quantity #4, lot # [id number], exp date 04/2020 75mg Prefilled Syringe >> quantity #2, lot # [id number], exp date 02/2020 Medication arrival date: 07/29/2019 Received by: Lindsay Lemons, CMA   

## 2019-08-05 ENCOUNTER — Ambulatory Visit (INDEPENDENT_AMBULATORY_CARE_PROVIDER_SITE_OTHER): Payer: Medicare Other

## 2019-08-05 ENCOUNTER — Other Ambulatory Visit: Payer: Self-pay

## 2019-08-05 DIAGNOSIS — J4551 Severe persistent asthma with (acute) exacerbation: Secondary | ICD-10-CM | POA: Diagnosis not present

## 2019-08-05 MED ORDER — OMALIZUMAB 150 MG/ML ~~LOC~~ SOSY
300.0000 mg | PREFILLED_SYRINGE | Freq: Once | SUBCUTANEOUS | Status: AC
  Administered 2019-08-05: 300 mg via SUBCUTANEOUS

## 2019-08-05 MED ORDER — OMALIZUMAB 75 MG/0.5ML ~~LOC~~ SOSY
75.0000 mg | PREFILLED_SYRINGE | Freq: Once | SUBCUTANEOUS | Status: AC
  Administered 2019-08-05: 75 mg via SUBCUTANEOUS

## 2019-08-05 NOTE — Progress Notes (Signed)
Have you been hospitalized within the last 10 days?  No Do you have a fever?  No Do you have a cough?  No Do you have a headache or sore throat? No Do you have your Epi Pen visible and is it within date?  Yes 

## 2019-08-19 ENCOUNTER — Other Ambulatory Visit: Payer: Self-pay

## 2019-08-19 ENCOUNTER — Ambulatory Visit (INDEPENDENT_AMBULATORY_CARE_PROVIDER_SITE_OTHER): Payer: Medicare Other

## 2019-08-19 DIAGNOSIS — J4551 Severe persistent asthma with (acute) exacerbation: Secondary | ICD-10-CM | POA: Diagnosis not present

## 2019-08-19 MED ORDER — OMALIZUMAB 75 MG/0.5ML ~~LOC~~ SOSY
75.0000 mg | PREFILLED_SYRINGE | Freq: Once | SUBCUTANEOUS | Status: AC
  Administered 2019-08-19: 10:00:00 75 mg via SUBCUTANEOUS

## 2019-08-19 MED ORDER — OMALIZUMAB 150 MG/ML ~~LOC~~ SOSY: 150.0000 mg | PREFILLED_SYRINGE | Freq: Once | SUBCUTANEOUS | Status: DC

## 2019-08-19 MED ORDER — OMALIZUMAB 150 MG/ML ~~LOC~~ SOSY
300.0000 mg | PREFILLED_SYRINGE | Freq: Once | SUBCUTANEOUS | Status: AC
  Administered 2019-08-19: 300 mg via SUBCUTANEOUS

## 2019-08-19 NOTE — Progress Notes (Signed)
All questions were answered by the patient before medication was administered. Have you been hospitalized in the last 10 days? No Do you have a fever? No Do you have a cough? No Do you have a headache or sore throat? No  

## 2019-08-25 ENCOUNTER — Telehealth: Payer: Self-pay | Admitting: Pulmonary Disease

## 2019-08-25 NOTE — Telephone Encounter (Signed)
[name] Prefilled Syringe Order: 150mg Prefilled Syringe:  #4 75mg Prefilled Syringe: #2 Ordered Date: 08/25/19  Expected date of arrival: 08/26/19  Ordered by: Jessica Jones, CMA  Specialty Pharmacy: [facility] 

## 2019-08-26 NOTE — Telephone Encounter (Signed)
[name] Prefilled Syringe Received:  150mg Prefilled Syringe >> quantity #4, lot # [id number], exp date 12/2019 75mg Prefilled Syringe >> quantity #2, lot # [id number], exp date 02/2020 Medication arrival date: 08/26/2019 Received by: Lindsay Lemons, CMA   

## 2019-09-02 ENCOUNTER — Other Ambulatory Visit: Payer: Self-pay

## 2019-09-02 ENCOUNTER — Encounter: Payer: Self-pay | Admitting: Pulmonary Disease

## 2019-09-02 ENCOUNTER — Ambulatory Visit (INDEPENDENT_AMBULATORY_CARE_PROVIDER_SITE_OTHER): Payer: Medicare Other

## 2019-09-02 ENCOUNTER — Ambulatory Visit (INDEPENDENT_AMBULATORY_CARE_PROVIDER_SITE_OTHER): Payer: Medicare Other | Admitting: Pulmonary Disease

## 2019-09-02 ENCOUNTER — Ambulatory Visit: Payer: Medicare Other | Admitting: Pulmonary Disease

## 2019-09-02 VITALS — BP 116/66 | HR 84 | Ht 63.0 in | Wt 171.8 lb

## 2019-09-02 DIAGNOSIS — Z0000 Encounter for general adult medical examination without abnormal findings: Secondary | ICD-10-CM

## 2019-09-02 DIAGNOSIS — J449 Chronic obstructive pulmonary disease, unspecified: Secondary | ICD-10-CM

## 2019-09-02 DIAGNOSIS — J4551 Severe persistent asthma with (acute) exacerbation: Secondary | ICD-10-CM | POA: Diagnosis not present

## 2019-09-02 DIAGNOSIS — Z9989 Dependence on other enabling machines and devices: Secondary | ICD-10-CM

## 2019-09-02 DIAGNOSIS — G4733 Obstructive sleep apnea (adult) (pediatric): Secondary | ICD-10-CM

## 2019-09-02 MED ORDER — OMALIZUMAB 150 MG/ML ~~LOC~~ SOSY
300.0000 mg | PREFILLED_SYRINGE | Freq: Once | SUBCUTANEOUS | Status: AC
  Administered 2019-09-02: 11:00:00 300 mg via SUBCUTANEOUS

## 2019-09-02 MED ORDER — OMALIZUMAB 75 MG/0.5ML ~~LOC~~ SOSY
75.0000 mg | PREFILLED_SYRINGE | Freq: Once | SUBCUTANEOUS | Status: AC
  Administered 2019-09-02: 11:00:00 75 mg via SUBCUTANEOUS

## 2019-09-02 NOTE — Progress Notes (Signed)
Have you been hospitalized within the last 10 days?  Yes Do you have a fever?  No Do you have a cough?  No Do you have a headache or sore throat? No Do you have your Epi Pen visible and is it within date?  Yes    Discussed with [name] Quaker NP if patient may receive her [name] injection today since she was hospitalized 10/24 - 10/27 for GI Bleed.  O2 = 99% on room air, HR = 84, BP = 112/64.  Patient is feeling well today and son reports that labs have returned to normal.  Patient is scheduled for follow up visit with Dr. Elsworth [name] Lane this morning at 1100.  Per [name] Quaker NP, okay to receive [name].

## 2019-09-02 NOTE — Progress Notes (Signed)
@Patient  ID: [name] Lane, female    DOB: 06/10/1941, 78 y.o.   MRN: [id number]  Chief Complaint  Patient presents with  . Follow-up    states she feels better since hospital admission, breathng is good, wants records and [name] injections follow up in [place]    Referring provider: Quentin Lane, [name] Lane, Lane  HPI:  78 year old female former smoker followed in our office for COPD.  Previous elevations in IgE in 2013.  Started [name] injections at that time.  PMH: Type 2 diabetes, anxiety, hypertension, CAD, GERD, edema, chronic kidney disease, obesity Smoker/ Smoking History: Former smoker.  Quit 2001 Maintenance: [name], Spiriva HandiHaler, Pulmicort nebs Pt of: Dr. Vassie Lane  09/02/2019  - Visit   78 year old female presenting to our office today as a follow-up visit.  Patient recently was admitted at [facility] where she was treated as a COPD exacerbation as well as a GI bleed.  Primary care has been monitoring her hemoglobin since being discharged.  Patient feels that her breathing is getting back to baseline as well she feels that symptomatically she is back to baseline.   Tests:   01/07/2018-chest x-ray-chest x-ray stable  04/03/2017-spirometry-FVC 1.6 (65% predicted), ratio 64, FEV1 1 L (56% predicted)  Imaging: No results found.  Lab Results:  CBC    Component Value Date/Time   WBC 8.6 06/01/2015 0342   RBC 3.89 06/01/2015 0342   HGB 9.5 (L) 06/01/2015 0342   HCT 30.5 (L) 06/01/2015 0342   PLT 313 06/01/2015 0342   MCV 78.4 06/01/2015 0342   MCH 24.4 (L) 06/01/2015 0342   MCHC 31.1 06/01/2015 0342   RDW 15.7 (H) 06/01/2015 0342   LYMPHSABS 0.5 (L) 12/31/2013 0520   MONOABS 0.1 12/31/2013 0520   EOSABS 0.0 12/31/2013 0520   BASOSABS 0.0 12/31/2013 0520    BMET    Component Value Date/Time   NA 139 06/01/2015 0342   K 3.7 06/01/2015 0342   CL 110 06/01/2015 0342   CO2 21 (L) 06/01/2015 0342   GLUCOSE 128 (H) 06/01/2015 0342   BUN 14 06/01/2015 0342   CREATININE 1.62 (H) 06/01/2015 0342   CREATININE 1.59 (H) 02/08/2015 1345   CALCIUM 9.0 06/01/2015 0342   GFRNONAA 30 (L) 06/01/2015 0342   GFRAA 35 (L) 06/01/2015 0342    BNP No results found for: BNP  ProBNP    Component Value Date/Time   PROBNP 320.8 (H) 12/30/2013 1728    Specialty Problems      Pulmonary Problems   Asthma exacerbation    Persistent, has a component of VCD with sudden onset upper airway pseudowheeze IGE ~600-800, + rast >begin [name] process 10/01/12        COPD (chronic obstructive pulmonary disease) (HCC)    Gold stg 3, on prednisone from july'11- 3/12       OTHER DISEASES OF VOCAL CORDS    Qualifier: Diagnosis of  By: [name] Lane, [name] Lane.        OSA on CPAP    Moderate OSA corrected by 12 cm (on autotitration)      Dyspnea      Allergies  Allergen Reactions  . Enalapril Swelling    Per patient's son, her mouth and tongue became swollen.   . Heparin Other (See Comments)    PATIENT ALMOST DIED FROM SIGNIFICANT BLEEDING EVENT, WAS BROUGHT TO [facility] ED AS A RESULT (2001) Other reaction(s): OTHER Other reaction(s): Bleeding, Other PATIENT ALMOST DIED FROM SIGNIFICANT BLEEDING EVENT, [facility]  [facility] ([facility])  Other reaction(s): OTHER  . Morphine And Related Other (See Comments)    CAUSED SIGNIFICANT HYPOTENSION AND PATIENT CRASHED AND NEEDED INSTANT INTERVENTION Other reaction(s): Hypotension, Other Other reaction(s): OTHER CAUSED SIGNIFICANT HYPOTENSION AND PATIENT CRASHED AND NEEDED INSTANT INTERVENTION Other reaction(s): OTHER Hypotension   . Ace Inhibitors Swelling  . Acetaminophen     Other reaction(s): Other Excessively sleepy, fatigue  . Atorvastatin Other (See Comments)    REACTION: muscle aches Other reaction(s): Other Other reaction(s): MUSCLE PAIN REACTION: muscle aches Other reaction(s): MUSCLE PAIN   . Benzonatate Other (See Comments)    REACTION: confusion Other reaction(s): Other REACTION: confusion     Immunization History  Administered Date(s) Administered  . Influenza Split 08/25/2011, 07/30/2013  . Influenza Whole 07/30/2009, 09/07/2010, 08/28/2012, 08/21/2017  . Influenza, High Dose Seasonal PF 10/17/2016, 07/18/2018, 08/22/2019  . Influenza, Seasonal, Injecte, Preservative Fre 08/30/2017  . Influenza,inj,Quad PF,6+ Mos 09/03/2015, 07/18/2018  . Pneumococcal Conjugate-13 08/12/2018  . Tdap 08/22/2019    Past Medical History:  Diagnosis Date  . Anxiety   . CAD (coronary artery disease)    a. 2003 BMS to RCA & LAD; PTCA of ISR 2003 b.12/2013 - staged DES PCI of the D1 and mRCA. c. 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  . Carotid bruit   . CKD (chronic kidney disease), stage III    a. Stage III-IV.  Marland Kitchen COPD (chronic obstructive pulmonary disease) ([place])   . Edema   . GE reflux   . HLD (hyperlipidemia)   . Hypertension   . Mild aortic stenosis    a. By cath 04/09/2014.  Marland Kitchen Obstructive sleep apnea   . Retroperitoneal bleed    a. Hx of this on lovenox in 2001 (had been admitted for NSTEMI).  . Type 2 diabetes mellitus treated with insulin ([place])   . Unspecified asthma(493.90)     Tobacco History: Social History   Tobacco Use  Smoking Status Former Smoker  . Quit date: 10/31/1999  . Years since quitting: 19.8  Smokeless Tobacco Never Used   Counseling given: Yes   Continue to not smoke  Outpatient Encounter Medications as of 09/02/2019  Medication Sig  . acetaminophen (TYLENOL) 325 MG tablet Take 325-650 mg by mouth every 6 (six) hours as needed for moderate pain.   Marland Kitchen albuterol (PROVENTIL HFA;VENTOLIN HFA) 108 (90 BASE) MCG/ACT inhaler Inhale 2 puffs into the lungs every 6 (six) hours as needed for wheezing or shortness of breath.  Marland Kitchen albuterol (PROVENTIL) (2.5 MG/3ML) 0.083% nebulizer solution Take 2.5 mg by nebulization every 6 (six) hours as needed for wheezing or shortness of breath.  . allopurinol (ZYLOPRIM) 100  MG tablet Take 100 mg by mouth daily.  . budesonide (PULMICORT) 0.5 MG/2ML nebulizer solution Take 0.5 mg by nebulization 2 (two) times daily.  . cetirizine (ZYRTEC) 10 MG tablet Take 10 mg by mouth daily.    Marland Kitchen DALIRESP 500 MCG TABS tablet TAKE 1 TABLET BY MOUTH EVERY DAY  . FLUoxetine (PROZAC) 20 MG capsule Take 20 mg by mouth daily.  . furosemide (LASIX) 20 MG tablet Take 20-40 mg by mouth See admin instructions. TYPICALLY TAKES 1 TAB DAILY, BUT CAN TAKE ADDT'L TAB AS NEEDED FOR SHORTNESS OF BREATH, FLUID BUILDUP  . gabapentin (NEURONTIN) 400 MG capsule Take 400 mg by mouth daily.  . insulin glargine (LANTUS) 100 UNIT/ML injection Inject 30 Units into the skin at bedtime.   . insulin lispro (HUMALOG)  100 UNIT/ML injection Inject 0-12 Units into the skin 3 (three) times daily with meals. If BG less than 150, no insulin given 151-200, takes 3 units 201-250, takes 5 units  251-300, takes 8 units 301-350, takes 10 units 351-400, takes 12 units 400+, call Lane  . isosorbide mononitrate (IMDUR) 30 MG 24 hr tablet TAKE 1 TABLET(30 MG) BY MOUTH DAILY  . LORazepam (ATIVAN) 1 MG tablet Take 1 mg by mouth 2 (two) times daily as needed for anxiety or sleep.   . metoprolol tartrate (LOPRESSOR) 25 MG tablet Take 1 tablet (25 mg total) by mouth 2 (two) times daily.  . nitroGLYCERIN (NITROSTAT) 0.4 MG SL tablet Place 1 tablet (0.4 mg total) under the tongue every 5 (five) minutes as needed for chest pain.  Marland Kitchen omeprazole (PRILOSEC) 40 MG capsule Take 1 capsule by mouth daily at 2 PM.  . potassium chloride (K-DUR) 10 MEQ tablet TAKE 1 TABLET BY MOUTH EVERY DAY. TAKE ADDTIONAL 1 TABLET ON DAYS YOU TAKE EXTRA LASIX  . prednisoLONE acetate (PRED FORTE) 1 % ophthalmic suspension Place 1 drop into both eyes 3 (three) times daily.  . predniSONE (DELTASONE) 5 MG tablet Increase Prednisone 10mg  daily for 1 week then 5mg  daily and hold at this dose  . ranitidine (ZANTAC) 150 MG tablet Take 150 mg by mouth 2 (two) times  daily.   Respiratory Therapy Supplies (FLUTTER) DEVI 1 Device by Does not apply route 3 (three) times daily.  tetracaine (PONTOCAINE) 0.5 % ophthalmic solution Place 2 drops into the left eye every 6 (six) hours as needed (Pain).  . ticagrelor (BRILINTA) 60 MG TABS tablet Take 1 tablet (60 mg total) by mouth 2 (two) times daily.  Marland Kitchen tiotropium (SPIRIVA) 18 MCG inhalation capsule Place 1 capsule (18 mcg total) into inhaler and inhale daily.  . [DISCONTINUED] aspirin 81 MG tablet Take 81 mg by mouth daily.    . [DISCONTINUED] gabapentin (NEURONTIN) 300 MG capsule Take 400 mg by mouth daily.    Facility-Administered Encounter Medications as of 09/02/2019  Medication  . omalizumab Marland Kitchen) injection 150 mg  . omalizumab 13/12/2018) injection 150 mg  . omalizumab Geoffry Paradise) injection 150 mg  . omalizumab Geoffry Paradise) injection 375 mg  . omalizumab Geoffry Paradise) injection 375 mg  . omalizumab Geoffry Paradise) injection 375 mg  . omalizumab Geoffry Paradise) injection 375 mg     Review of Systems  Review of Systems  Constitutional: Positive for fatigue. Negative for activity change and fever.  HENT: Negative for sinus pressure, sinus pain and sore throat.   Respiratory: Positive for cough (productive, light grey mucous) and shortness of breath. Negative for wheezing.   Cardiovascular: Negative for chest pain and palpitations.  Gastrointestinal: Negative for diarrhea, nausea and vomiting.  Musculoskeletal: Negative for arthralgias.  Neurological: Negative for dizziness.  Psychiatric/Behavioral: Negative for sleep disturbance. The patient is not nervous/anxious.      Physical Exam  BP 116/66 (BP Location: Left Arm, Cuff Size: Normal)   Pulse 84   Ht 5\' 3"  (1.6 m)   Wt 171 lb 12.8 oz (77.9 kg)   SpO2 99%   BMI 30.43 kg/m   Wt Readings from Last 5 Encounters:  09/02/19 171 lb 12.8 oz (77.9 kg)  01/17/19 210 lb 9.6 oz (95.5 kg)  09/16/18 211 lb 6.4 oz (95.9 kg)  07/18/18 204 lb (92.5 kg)  01/07/18 186 lb 3.2  oz (84.5 kg)    BMI Readings from Last 5 Encounters:  09/02/19 30.43 kg/m  01/17/19 37.31 kg/m  09/16/18 37.45 kg/m  07/18/18 36.14 kg/m  01/07/18 32.98 kg/m     Physical Exam Vitals signs and nursing note reviewed.  Constitutional:      General: She is not in acute distress.    Appearance: Normal appearance. She is obese.     Comments: Chronically ill elderly female in wheelchair today  HENT:     Head: Normocephalic and atraumatic.     Right Ear: Tympanic membrane, ear canal and external ear normal. There is no impacted cerumen.     Left Ear: Tympanic membrane, ear canal and external ear normal. There is no impacted cerumen.     Ears:     Comments: Hearing aids bilaterally    Nose: Nose normal. No congestion or rhinorrhea.     Mouth/Throat:     Mouth: Mucous membranes are moist.     Pharynx: Oropharynx is clear.  Eyes:     Pupils: Pupils are equal, round, and reactive to light.  Neck:     Musculoskeletal: Normal range of motion.  Cardiovascular:     Rate and Rhythm: Normal rate and regular rhythm.     Pulses: Normal pulses.     Heart sounds: Normal heart sounds. No murmur.  Pulmonary:     Effort: Pulmonary effort is normal. No respiratory distress.     Breath sounds: No decreased air movement. No decreased breath sounds or wheezing.     Comments: Expiratory rhonchi, improves with cough, noisy chest Musculoskeletal:     Right lower leg: Edema present.     Left lower leg: Edema present.  Skin:    Capillary Refill: Capillary refill takes less than 2 seconds.       Neurological:     General: No focal deficit present.     Mental Status: She is alert and oriented to person, place, and time. Mental status is at baseline.     Gait: Gait normal.  Psychiatric:        Mood and Affect: Mood normal.        Behavior: Behavior normal.        Thought Content: Thought content normal.        Judgment: Judgment normal.       Assessment & Plan:   OSA on CPAP Plan:  Continue CPAP therapy Establish care with [facility] Dr. Marlyne Beards  COPD (chronic obstructive pulmonary disease) Plan: Continue Spiriva HandiHaler Continue Pulmicort nebs Continue Daliresp Continue [name] injections Continue flutter valve use Continue rescue inhaler/nebulized albuterol use as needed Increase daily physical activity Establish care with [facility] as you are moving to [place] in January/2021  Healthcare maintenance Plan: Discussed with primary care that you need a referral for a primary care provider in [place] as you are moving there in January/2021  Start increasing daily physical activity  Continue to follow-up with primary care for lower extremity wounds    Return if symptoms worsen or fail to improve, for Follow up with Dr. Vassie Loll, Follow up with Elisha Headland FNP-C.   Coral Ceo, NP 09/02/2019   This appointment was 26 minutes long with over 50% of the time in direct face-to-face patient care, assessment, plan of care, and follow-up.

## 2019-09-02 NOTE — Assessment & Plan Note (Signed)
Plan: Continue CPAP therapy Establish care with [facility] Dr. Creig Hines

## 2019-09-02 NOTE — Patient Instructions (Addendum)
You were seen today by Lauraine Rinne, NP  for:   1. OSA on CPAP  We recommend that you continue using your CPAP daily >>>Keep up the hard work using your device >>> Goal should be wearing this for the entire night that you are sleeping, at least 4 to 6 hours  Remember:  . Do not drive or operate heavy machinery if tired or drowsy.  . Please notify the supply company and office if you are unable to use your device regularly due to missing supplies or machine being broken.  . Work on maintaining a healthy weight and following your recommended nutrition plan  . Maintain proper daily exercise and movement  . Maintaining proper use of your device can also help improve management of other chronic illnesses such as: Blood pressure, blood sugars, and weight management.   BiPAP/ CPAP Cleaning:  >>>Clean weekly, with Dawn soap, and bottle brush.  Set up to air dry.   Establish care with [facility] pulmonary Dr. Ashok Cordia  2. Chronic obstructive pulmonary disease, unspecified COPD type (HCC)  Continue [name] injections   Continue Daliresp   Spiriva Handihaler 77mg capsule >>> take 2 puffs using the inhaler and 1 capsule daily  >>> rinse mouth out after use  >>> take daily no matter what  >>> this is not a rescue inhaler   Continue Pulmicort nebulized meds Take 1 treatment every 12 hours Do this no matter what  Can use rescue inhaler albuterol or albuterol nebulized meds every 4-6 hours as needed for shortness of breath or wheezing  Note your daily symptoms > remember "red flags" for COPD:   >>>Increase in cough >>>increase in sputum production >>>increase in shortness of breath or activity  intolerance.   If you notice these symptoms, please call the office to be seen.   Start gradually increasing your daily physical activity Utilize chair exercises Walk with your walker  [facility] Dr. NAshok Cordia 3. Healthcare maintenance   Continue follow-up with primary care Continue to perform wound changes on lower extremities, contact primary care with any concerns or worsening symptoms  [facility] with primary care [facility]   Follow Up:    Return if symptoms worsen or fail to improve, for Follow up with Dr. AElsworth Soho Follow up with BWyn QuakerFNP-C.   Please do your part to reduce the spread of COVID-19:      Reduce your risk of any infection  and COVID19 by using the similar precautions used for avoiding the common cold or flu:  .Marland KitchenWash your hands often with soap and warm water for at least 20 seconds.  If soap and water are not readily available, use an alcohol-based hand sanitizer with at least 60% alcohol.  . If coughing or sneezing, cover your mouth and nose by coughing or sneezing into the elbow areas of your shirt or coat, into a tissue or into your sleeve (not your hands). .Langley GaussA MASK when in public  . Avoid shaking hands with others and consider head nods or verbal greetings only. . Avoid touching your eyes, nose, or mouth with unwashed hands.  . Avoid close contact with people who are sick. . Avoid places or events with large numbers of people in one location, like concerts or sporting events. . If you have some symptoms but not all symptoms, continue to monitor at home  and seek medical attention if your symptoms worsen. . If you are having a medical emergency, call 911.   [employer] [facility] [employer] / e-Visit: [email or web address]         [facility]: [facility]: [phone]                   [facility]: [phone]     It is flu season:   >>> Best ways to protect herself from the flu: Receive the yearly flu vaccine, practice good hand hygiene washing with soap and also using hand  sanitizer when available, eat a nutritious meals, get adequate rest, hydrate appropriately   Please contact the office if your symptoms worsen or you have concerns that you are not improving.   Thank you for choosing [facility] for your healthcare, and for allowing [place] to partner with you on your healthcare journey. I am thankful to be able to provide care to you today.   Wyn Quaker FNP-C    COPD and Physical Activity Chronic obstructive pulmonary disease (COPD) is a long-term (chronic) condition that affects the lungs. COPD is a general term that can be used to describe many different lung problems that cause lung swelling (inflammation) and limit airflow, including chronic bronchitis and emphysema. The main symptom of COPD is shortness of breath, which makes it harder to do even simple tasks. This can also make it harder to exercise and be active. Talk with your health care provider about treatments to help you breathe better and actions you can take to prevent breathing problems during physical activity. What are the benefits of exercising with COPD? Exercising regularly is an important part of a healthy lifestyle. You can still exercise and do physical activities even though you have COPD. Exercise and physical activity improve your shortness of breath by increasing blood flow (circulation). This causes your heart to pump more oxygen through your body. Moderate exercise can improve your:  Oxygen use.  Energy level.  Shortness of breath.  Strength in your breathing muscles.  Heart health.  Sleep.  Self-esteem and feelings of self-worth.  Depression, stress, and anxiety levels. Exercise can benefit everyone with COPD. The severity of your disease may affect how hard you can exercise, especially at first, but everyone can benefit. Talk with your health care provider about how much exercise is safe for you, and which activities and exercises are safe for you. What actions  can I take to prevent breathing problems during physical activity?  Sign up for a pulmonary rehabilitation program. This type of program may include: ? Education about lung diseases. ? Exercise classes that teach you how to exercise and be more active while improving your breathing. This usually involves:  Exercise using your lower extremities, such as a stationary bicycle.  About 30 minutes of exercise, 2 to 5 times per week, for 6 to 12 weeks  Strength training, such as push ups or leg lifts. ? Nutrition education. ? Group classes in which you can talk with others who also have COPD and learn ways to manage stress.  If you use an oxygen tank, you should use it while you exercise. Work with your health care provider to adjust your oxygen for your physical activity. Your resting flow rate is different from your flow rate during physical activity.  While you are exercising: ? Take slow breaths. ? Pace yourself and do not try to go too  fast. ? Purse your lips while breathing out. Pursing your lips is similar to a kissing or whistling position. ? If doing exercise that uses a quick burst of effort, such as weight lifting:  Breathe in before starting the exercise.  Breathe out during the hardest part of the exercise (such as raising the weights). Where to find support You can find support for exercising with COPD from:  Your health care provider.  A pulmonary rehabilitation program.  Your local health department or community health programs.  Support groups, online or in-person. Your health care provider may be able to recommend support groups. Where to find more information You can find more information about exercising with COPD from:  American Lung Association: ClassInsider.se.  COPD Foundation: https://www.rivera.net/. Contact a health care provider if:  Your symptoms get worse.  You have chest pain.  You have nausea.  You have a fever.  You have trouble talking or catching  your breath.  You want to start a new exercise program or a new activity. Summary  COPD is a general term that can be used to describe many different lung problems that cause lung swelling (inflammation) and limit airflow. This includes chronic bronchitis and emphysema.  Exercise and physical activity improve your shortness of breath by increasing blood flow (circulation). This causes your heart to provide more oxygen to your body.  Contact your health care provider before starting any exercise program or new activity. Ask your health care provider what exercises and activities are safe for you. This information is not intended to replace advice given to you by your health care provider. Make sure you discuss any questions you have with your health care provider. Document Released: 11/08/2017 Document Revised: 02/05/2019 Document Reviewed: 11/08/2017 Elsevier Patient Education  [place].    Exercises To Do While Sitting  Exercises that you do while sitting (chair exercises) can give you many of the same benefits as full exercise. Benefits include strengthening your heart, burning calories, and keeping muscles and joints healthy. Exercise can also improve your mood and help with depression and anxiety. You may benefit from chair exercises if you are unable to do standing exercises because of:  Diabetic foot pain.  Obesity.  Illness.  Arthritis.  Recovery from surgery or injury.  Breathing problems.  Balance problems.  Another type of disability. Before starting chair exercises, check with your health care provider or a physical therapist to find out how much exercise you can tolerate and which exercises are safe for you. If your health care provider approves:  Start out slowly and build up over time. Aim to work up to about 10-20 minutes for each exercise session.  Make exercise part of your daily routine.  Drink water when you exercise. Do not wait until you are  thirsty. Drink every 10-15 minutes.  Stop exercising right away if you have pain, nausea, shortness of breath, or dizziness.  If you are exercising in a wheelchair, make sure to lock the wheels.  Ask your health care provider whether you can do tai chi or yoga. Many positions in these mind-body exercises can be modified to do while seated. Warm-up Before starting other exercises: 1. Sit up as straight as you can. Have your knees bent at 90 degrees, which is the shape of the capital letter "L." Keep your feet flat on the floor. 2. Sit at the front edge of your chair, if you can. 3. Pull in (tighten) the muscles in your abdomen and stretch  your spine and neck as straight as you can. Hold this position for a few minutes. 4. Breathe in and out evenly. Try to concentrate on your breathing, and relax your mind. Stretching Exercise A: Arm stretch 1. Hold your arms out straight in front of your body. 2. Bend your hands at the wrist with your fingers pointing up, as if signaling someone to stop. Notice the slight tension in your forearms as you hold the position. 3. Keeping your arms out and your hands bent, rotate your hands outward as far as you can and hold this stretch. Aim to have your thumbs pointing up and your pinkie fingers pointing down. Slowly repeat arm stretches for one minute as tolerated. Exercise B: Leg stretch 1. If you can move your legs, try to "draw" letters on the floor with the toes of your foot. Write your name with one foot. 2. Write your name with the toes of your other foot. Slowly repeat the movements for one minute as tolerated. Exercise C: Reach for the sky 1. Reach your hands as far over your head as you can to stretch your spine. 2. Move your hands and arms as if you are climbing a rope. Slowly repeat the movements for one minute as tolerated. Range of motion exercises Exercise A: Shoulder roll 1. Let your arms hang loosely at your sides. 2. Lift just your  shoulders up toward your ears, then let them relax back down. 3. When your shoulders feel loose, rotate your shoulders in backward and forward circles. Do shoulder rolls slowly for one minute as tolerated. Exercise B: March in place 1. As if you are marching, pump your arms and lift your legs up and down. Lift your knees as high as you can. ? If you are unable to lift your knees, just pump your arms and move your ankles and feet up and down. March in place for one minute as tolerated. Exercise C: Seated jumping jacks 1. Let your arms hang down straight. 2. Keeping your arms straight, lift them up over your head. Aim to point your fingers to the ceiling. 3. While you lift your arms, straighten your legs and slide your heels along the floor to your sides, as wide as you can. 4. As you bring your arms back down to your sides, slide your legs back together. ? If you are unable to use your legs, just move your arms. Slowly repeat seated jumping jacks for one minute as tolerated. Strengthening exercises Exercise A: Shoulder squeeze 1. Hold your arms straight out from your body to your sides, with your elbows bent and your fists pointed at the ceiling. 2. Keeping your arms in the bent position, move them forward so your elbows and forearms meet in front of your face. 3. Open your arms back out as wide as you can with your elbows still bent, until you feel your shoulder blades squeezing together. Hold for 5 seconds. Slowly repeat the movements forward and backward for one minute as tolerated. Contact a health care provider if you:  Had to stop exercising due to any of the following: ? Pain. ? Nausea. ? Shortness of breath. ? Dizziness. ? Fatigue.  Have significant pain or soreness after exercising. Get help right away if you have:  Chest pain.  Difficulty breathing. These symptoms may represent a serious problem that is an emergency. Do not wait to see if the symptoms will go away. Get  medical help right away. Call your local emergency services (  911 in the [place] Do not drive yourself to the hospital. This information is not intended to replace advice given to you by your health care provider. Make sure you discuss any questions you have with your health care provider. Document Released: 08/29/2017 Document Revised: 02/06/2019 Document Reviewed: 08/29/2017 Elsevier Patient Education  2020 [employer].

## 2019-09-02 NOTE — Assessment & Plan Note (Addendum)
Plan: Discussed with primary care that you need a referral for a primary care provider in [place] as you are moving there in January/2021  Start increasing daily physical activity  Continue to follow-up with primary care for lower extremity wounds

## 2019-09-02 NOTE — Assessment & Plan Note (Signed)
Plan: Continue Spiriva HandiHaler Continue Pulmicort nebs Continue Daliresp Continue [name] injections Continue flutter valve use Continue rescue inhaler/nebulized albuterol use as needed Increase daily physical activity Establish care with [facility] as you are moving to [place] in January/2021

## 2019-09-16 ENCOUNTER — Ambulatory Visit (INDEPENDENT_AMBULATORY_CARE_PROVIDER_SITE_OTHER): Payer: Medicare Other

## 2019-09-16 ENCOUNTER — Other Ambulatory Visit: Payer: Self-pay

## 2019-09-16 DIAGNOSIS — J4551 Severe persistent asthma with (acute) exacerbation: Secondary | ICD-10-CM

## 2019-09-16 MED ORDER — OMALIZUMAB 150 MG/ML ~~LOC~~ SOSY
300.0000 mg | PREFILLED_SYRINGE | Freq: Once | SUBCUTANEOUS | Status: AC
  Administered 2019-09-16: 11:00:00 300 mg via SUBCUTANEOUS

## 2019-09-16 MED ORDER — OMALIZUMAB 75 MG/0.5ML ~~LOC~~ SOSY
75.0000 mg | PREFILLED_SYRINGE | Freq: Once | SUBCUTANEOUS | Status: AC
  Administered 2019-09-16: 11:00:00 75 mg via SUBCUTANEOUS

## 2019-09-16 NOTE — Progress Notes (Signed)
Have you been hospitalized within the last 10 days?  No Do you have a fever?  No Do you have a cough?  No Do you have a headache or sore throat? No Do you have your Epi Pen visible and is it within date?  Yes 

## 2019-09-23 ENCOUNTER — Telehealth: Payer: Self-pay

## 2019-09-23 NOTE — Telephone Encounter (Signed)
[name] Prefilled Syringe Order: 150mg Prefilled Syringe:  #4 75mg Prefilled Syringe: #2 Ordered Date: 09/23/19  Expected date of arrival: 09/24/19  Ordered by: Joyice Magda L Mazey Mantell, CMA  Specialty Pharmacy: [facility] 

## 2019-09-24 NOTE — Telephone Encounter (Signed)
[name] Prefilled Syringe Received:  150mg  Prefilled Syringe >> quantity #4  lot # [id number] exp date 04/30/2019 75mg  Prefilled Syringe >> quantity #2 lot # [id number]  exp date 04/29/2020 Medication arrival date: 09/24/19  Received by: Len Blalock, CMA

## 2019-09-26 ENCOUNTER — Other Ambulatory Visit: Payer: Self-pay | Admitting: Cardiovascular Disease

## 2019-09-30 ENCOUNTER — Ambulatory Visit: Payer: Medicare Other

## 2019-10-02 ENCOUNTER — Ambulatory Visit (INDEPENDENT_AMBULATORY_CARE_PROVIDER_SITE_OTHER): Payer: Medicare Other | Admitting: Cardiovascular Disease

## 2019-10-02 ENCOUNTER — Encounter: Payer: Self-pay | Admitting: Cardiovascular Disease

## 2019-10-02 ENCOUNTER — Other Ambulatory Visit: Payer: Self-pay

## 2019-10-02 ENCOUNTER — Ambulatory Visit (INDEPENDENT_AMBULATORY_CARE_PROVIDER_SITE_OTHER): Payer: Medicare Other

## 2019-10-02 VITALS — BP 142/80 | HR 77 | Temp 97.2°F | Ht 63.0 in | Wt 181.0 lb

## 2019-10-02 DIAGNOSIS — I350 Nonrheumatic aortic (valve) stenosis: Secondary | ICD-10-CM

## 2019-10-02 DIAGNOSIS — I2510 Atherosclerotic heart disease of native coronary artery without angina pectoris: Secondary | ICD-10-CM | POA: Diagnosis not present

## 2019-10-02 DIAGNOSIS — Z01810 Encounter for preprocedural cardiovascular examination: Secondary | ICD-10-CM

## 2019-10-02 DIAGNOSIS — I639 Cerebral infarction, unspecified: Secondary | ICD-10-CM | POA: Diagnosis not present

## 2019-10-02 DIAGNOSIS — E785 Hyperlipidemia, unspecified: Secondary | ICD-10-CM

## 2019-10-02 DIAGNOSIS — I10 Essential (primary) hypertension: Secondary | ICD-10-CM

## 2019-10-02 DIAGNOSIS — G4733 Obstructive sleep apnea (adult) (pediatric): Secondary | ICD-10-CM | POA: Diagnosis not present

## 2019-10-02 DIAGNOSIS — E782 Mixed hyperlipidemia: Secondary | ICD-10-CM | POA: Diagnosis not present

## 2019-10-02 DIAGNOSIS — J4551 Severe persistent asthma with (acute) exacerbation: Secondary | ICD-10-CM

## 2019-10-02 MED ORDER — OMALIZUMAB 150 MG/ML ~~LOC~~ SOSY
300.0000 mg | PREFILLED_SYRINGE | Freq: Once | SUBCUTANEOUS | Status: AC
  Administered 2019-10-02: 300 mg via SUBCUTANEOUS

## 2019-10-02 MED ORDER — OMALIZUMAB 75 MG/0.5ML ~~LOC~~ SOSY
75.0000 mg | PREFILLED_SYRINGE | Freq: Once | SUBCUTANEOUS | Status: AC
  Administered 2019-10-02: 75 mg via SUBCUTANEOUS

## 2019-10-02 NOTE — Patient Instructions (Signed)
Medication Instructions:  STOP- Brilinta START- Aspirin 81 mg by mouth daily  *If you need a refill on your cardiac medications before your next appointment, please call your pharmacy*  Lab Work: None Ordered  Testing/Procedures: None ordered  Follow-Up: At [facility], you and your health needs are our priority.  As part of our continuing mission to provide you with exceptional heart care, we have created designated Provider Care Teams.  These Care Teams include your primary Cardiologist (physician) and Advanced Practice Providers (APPs -  Physician Assistants and Nurse Practitioners) who all work together to provide you with the care you need, when you need it.  Your next appointment:   As Needed

## 2019-10-02 NOTE — Progress Notes (Signed)
Have you been hospitalized within the last 10 days?  No Do you have a fever?  No Do you have a cough?  No Do you have a headache or sore throat? No Do you have your Epi Pen visible and is it within date?  No 

## 2019-10-02 NOTE — Progress Notes (Signed)
Patient ID: [name] Lane, female   DOB: 08-06-1941, 78 y.o.   MRN: [id number]     HPI: [name] Lane is a 78 y.o. female who presents to the office today for a 21 month follow up cardiology evaluation.  [name] Lane has a history of hypertension, hyperlipidemia, prior CABG with bare-metal stenting to her RCA and LAD in 2003, and PTCA for in-stent restenosis in 2003.  She was admitted to [facility] on 12/30/2013 with chest pain.  A nuclear study suggested distal anterolateral ischemia and she had mildly positive troponin.  Cardiac catheterization by Dr. Ellyn Hack showed severe 2 vessel CAD with 95% proximal diagonal stenosis which was felt to be the culprit lesion.  She also at 80% RCA stenosis.  She underwent single vessel intervention to the diagonal vessel initially and due to renal insufficiency underwent staged intervention to her proximal RCA with DES stent.  She was seen by Dr. Ellyn Hack on 02/12/2014 and had been on dual antiplatelet therapy.  She was rehospitalized with chest pain and was transferred to from [facility].  She underwent repeat cardiac catheterization and her stents were widely patent and the LAD, diagonal, and right coronary artery.  It was felt that her right posterior lateral 3 lesion was the culprit for chest pain, but due to small size medical therapy was initially recommended with plans for potential cutting balloon intervention if recurrent symptoms developed.  She was also noted to have mild aortic stenosis with a peak gradient of 24 and a mean gradient of 19.   When I saw her in followup in June 2015, I recommended further titration of her carvedilol to 12.5 mg twice a day which improved  her chest pain. Time, she had occasional muscle spasms.  She had a history of depression and was taking Prozac.    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  I last saw her in February 2019 to not having seen her since December 2017.  She had been  hospitalized in [place] after she  had broken her left foot.  She denied any chest pain or shortness of breath.  She had back discomfort due to degenerative disc disease.  She denied recent bleeding.  She has obstructive sleep apnea and was using CPAP with 100% compliance.    Since I last saw her, she was last evaluated by Bunnie Domino in November 2019 for preoperative evaluation prior to undergoing bilateral nerve root block.  She was without significant cardiovascular complaints.  She denied bleeding, epistasis or bruising from anticoagulants.  She currently has been living in [facility].  She has had issues with recurrent falls with peripheral neuropathy and leg discomfort.  She will be moving to [facility] at the end of December.  She denies any angina.  There is some mild shortness of breath with activity.  She had recent laboratory showing anemia.  She presents for evaluation.   Past Medical History:  Diagnosis Date  . Anxiety   . CAD (coronary artery disease)    a. 2003 BMS to RCA & LAD; PTCA of ISR 2003 b.12/2013 - staged DES PCI of the D1 and mRCA. c. 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  . Carotid bruit   . CKD (chronic kidney disease), stage III    a. Stage III-IV.  Marland Kitchen COPD (chronic obstructive pulmonary disease) ([place])   . Edema   . GE reflux   .  HLD (hyperlipidemia)   . Hypertension   . Mild aortic stenosis    a. By cath 04/09/2014.  Marland Kitchen Obstructive sleep apnea   . Retroperitoneal bleed    a. Hx of this on lovenox in 2001 (had been admitted for NSTEMI).  . Type 2 diabetes mellitus treated with insulin ([place])   . Unspecified asthma(493.90)     Past Surgical History:  Procedure Laterality Date  . APPENDECTOMY    . CARDIAC CATHETERIZATION  11/18/1999   Small non-STEMI; moderate 50-60% RCA lesion treated medically secondary to percutaneous RP bleed  . CARDIAC CATHETERIZATION  April 2004,  October 2010, December 2011   Stable CAD with mild ISR in LAD and RCA; treated medically  . CARDIAC CATHETERIZATION  01/04/2012   4 small non-STEMI; Myoview with lateral ischemia-- 95% proximal D1, 80% mid RCA post stent -- staged PCI   . CORONARY ANGIOPLASTY   04/29/2002   Cutting Balloon PTCA of LAD stent for ISR with 2.5 mm balloon  . LEFT HEART CATHETERIZATION WITH CORONARY ANGIOGRAM N/A 01/02/2014   Procedure: LEFT HEART CATHETERIZATION WITH CORONARY ANGIOGRAM;  Surgeon: Leonie Man, MD;  Location: [facility] CATH LAB;  Service: Cardiovascular;  Laterality: N/A;  . LEFT HEART CATHETERIZATION WITH CORONARY ANGIOGRAM N/A 04/09/2014   Procedure: LEFT HEART CATHETERIZATION WITH CORONARY ANGIOGRAM;  Surgeon: Leonie Man, MD;  Location: [facility] CATH LAB;  Service: Cardiovascular;  Laterality: N/A;  . NM MYOVIEW LTD  01/01/2014   EF 64%. The distal anterior/anterolateral ischemia -- referred for cath  . PERCUTANEOUS CORONARY STENT INTERVENTION (PCI-S)  02/17/2002   Two-vessel PCI: 80% mid LAD -- 2.25 mm x 8 mm Express 2 BMS; in RCA RCA 80% 3.5 mm x 12 mm Express 2 BMS  . PERCUTANEOUS CORONARY STENT INTERVENTION (PCI-S)   03/07 & 06/2012   7th - Two-vessel PCI: 80% mid LAD -- 2.25 mm x 8 mm Express 2 BMS; in RCA RCA 80% 3.5 mm x 12 mm Express 2 BMS; 9th - mid RCA a Xience alpine DES 3.5 mm x 15 mm (postdilated 3.5 mm)   . PERCUTANEOUS CORONARY STENT INTERVENTION (PCI-S) N/A 01/06/2014   Procedure: PERCUTANEOUS CORONARY STENT INTERVENTION (PCI-S);  Surgeon: Leonie Man, MD;  Location: [facility] CATH LAB;  Service: Cardiovascular;  Laterality: N/A;  . TRANSTHORACIC ECHOCARDIOGRAM   01/02/2014   (Most recent) moderate concentric LVH. Normal EF of 65-70%. Grade 1 diastolic dysfunction. Mild AI    Allergies  Allergen Reactions  . Enalapril Swelling    Per patient's son, her mouth and tongue became swollen.   . Heparin Other (See Comments)    PATIENT ALMOST DIED FROM SIGNIFICANT BLEEDING EVENT, WAS [facility]GHT TO  [facility] AS A [facility]ULT (2001) Other reaction(s): OTHER Other reaction(s): Bleeding, Other PATIENT ALMOST DIED FROM SIGNIFICANT BLEEDING EVENT, WAS BROUGHT TO [facility] ED AS A RESULT (2001)  Other reaction(s): OTHER  . Morphine And Related Other (See Comments)    CAUSED SIGNIFICANT HYPOTENSION AND PATIENT CRASHED AND NEEDED INSTANT INTERVENTION Other reaction(s): Hypotension, Other Other reaction(s): OTHER CAUSED SIGNIFICANT HYPOTENSION AND PATIENT CRASHED AND NEEDED INSTANT INTERVENTION Other reaction(s): OTHER Hypotension   . Ace Inhibitors Swelling  . Acetaminophen     Other reaction(s): Other Excessively sleepy, fatigue  . Atorvastatin Other (See Comments)    REACTION: muscle aches Other reaction(s): Other Other reaction(s): MUSCLE PAIN REACTION: muscle aches Other reaction(s): MUSCLE PAIN   . Benzonatate Other (See Comments)    REACTION: confusion Other reaction(s): Other REACTION: confusion    Current  Outpatient Medications  Medication Sig Dispense Refill  . acetaminophen (TYLENOL) 325 MG tablet Take 325-650 mg by mouth every 6 (six) hours as needed for moderate pain.     Marland Kitchen albuterol (PROVENTIL HFA;VENTOLIN HFA) 108 (90 BASE) MCG/ACT inhaler Inhale 2 puffs into the lungs every 6 (six) hours as needed for wheezing or shortness of breath.    Marland Kitchen albuterol (PROVENTIL) (2.5 MG/3ML) 0.083% nebulizer solution Take 2.5 mg by nebulization every 6 (six) hours as needed for wheezing or shortness of breath.    . allopurinol (ZYLOPRIM) 100 MG tablet Take 100 mg by mouth daily.    . budesonide (PULMICORT) 0.5 MG/2ML nebulizer solution Take 0.5 mg by nebulization 2 (two) times daily.    . cetirizine (ZYRTEC) 10 MG tablet Take 10 mg by mouth daily.      Marland Kitchen DALIRESP 500 MCG TABS tablet TAKE 1 TABLET BY MOUTH EVERY DAY 30 tablet 0  . FLUoxetine (PROZAC) 20 MG capsule Take 20 mg by mouth daily.  3  . furosemide (LASIX) 20 MG tablet Take 20-40 mg by mouth See admin instructions. TYPICALLY TAKES 1 TAB  DAILY, BUT CAN TAKE ADDT'L TAB AS NEEDED FOR SHORTNESS OF BREATH, FLUID BUILDUP    . gabapentin (NEURONTIN) 400 MG capsule Take 400 mg by mouth daily.    . insulin glargine (LANTUS) 100 UNIT/ML injection Inject 30 Units into the skin at bedtime.     . insulin lispro (HUMALOG) 100 UNIT/ML injection Inject 0-12 Units into the skin 3 (three) times daily with meals. If BG less than 150, no insulin given 151-200, takes 3 units 201-250, takes 5 units  251-300, takes 8 units 301-350, takes 10 units 351-400, takes 12 units 400+, call MD    . isosorbide mononitrate (IMDUR) 30 MG 24 hr tablet TAKE 1 TABLET(30 MG) BY MOUTH DAILY 90 tablet 0  . LORazepam (ATIVAN) 1 MG tablet Take 1 mg by mouth 2 (two) times daily as needed for anxiety or sleep.     . metoprolol tartrate (LOPRESSOR) 25 MG tablet Take 1 tablet (25 mg total) by mouth 2 (two) times daily. 180 tablet 3  . nitroGLYCERIN (NITROSTAT) 0.4 MG SL tablet Place 1 tablet (0.4 mg total) under the tongue every 5 (five) minutes as needed for chest pain. 25 tablet 6  . omeprazole (PRILOSEC) 40 MG capsule Take 1 capsule by mouth daily at 2 PM.  12  . potassium chloride (K-DUR) 10 MEQ tablet TAKE 1 TABLET BY MOUTH EVERY DAY. TAKE ADDTIONAL 1 TABLET ON DAYS YOU TAKE EXTRA LASIX 30 tablet 11  . prednisoLONE acetate (PRED FORTE) 1 % ophthalmic suspension Place 1 drop into both eyes 3 (three) times daily.  2  . predniSONE (DELTASONE) 5 MG tablet Increase Prednisone '10mg'$  daily for 1 week then '5mg'$  daily and hold at this dose 45 tablet 4  . ranitidine (ZANTAC) 150 MG tablet Take 150 mg by mouth 2 (two) times daily.     Marland Kitchen Respiratory Therapy Supplies (FLUTTER) DEVI 1 Device by Does not apply route 3 (three) times daily. 1 each 0  . tetracaine (PONTOCAINE) 0.5 % ophthalmic solution Place 2 drops into the left eye every 6 (six) hours as needed (Pain). 2 mL 0  . tiotropium (SPIRIVA) 18 MCG inhalation capsule Place 1 capsule (18 mcg total) into inhaler and inhale daily.  30 capsule 1   Current Facility-Administered Medications  Medication Dose Route Frequency Provider Last Rate Last Dose  . omalizumab Arvid Right) injection 150 mg  150 mg  Subcutaneous Q14 Days Baird Lyons D, MD   150 mg at 04/14/19 1053  . omalizumab Arvid Right) injection 150 mg  150 mg Subcutaneous Q14 Days Baird Lyons D, MD   150 mg at 04/14/19 1053  . omalizumab Arvid Right) injection 150 mg  150 mg Subcutaneous Q14 Days Baird Lyons D, MD   150 mg at 04/14/19 1052  . omalizumab Arvid Right) injection 375 mg  375 mg Subcutaneous Q14 Days Rigoberto Noel, MD   300 mg at 02/14/19 1635  . omalizumab Arvid Right) injection 375 mg  375 mg Subcutaneous Q14 Days Rigoberto Noel, MD   375 mg at 02/28/19 1124  . omalizumab Arvid Right) injection 375 mg  375 mg Subcutaneous Q14 Days Rigoberto Noel, MD   375 mg at 03/14/19 1107  . omalizumab Arvid Right) injection 375 mg  375 mg Subcutaneous Q14 Days Rigoberto Noel, MD   375 mg at 05/26/19 1131    Social History   Socioeconomic History  . Marital status: Married    Spouse name: Not on file  . Number of children: Not on file  . Years of education: Not on file  . Highest education level: Not on file  Occupational History  . Occupation: Retirede  Scientific laboratory technician  . Financial resource strain: Not on file  . Food insecurity    Worry: Not on file    Inability: Not on file  . Transportation needs    Medical: Not on file    Non-medical: Not on file  Tobacco Use  . Smoking status: Former Smoker    Quit date: 10/31/1999    Years since quitting: 19.9  . Smokeless tobacco: Never Used  Substance and Sexual Activity  . Alcohol use: No  . Drug use: No  . Sexual activity: Not on file  Lifestyle  . Physical activity    Days per week: Not on file    Minutes per session: Not on file  . Stress: Not on file  Relationships  . Social Herbalist on phone: Not on file    Gets together: Not on file    Attends religious service: Not on file    Active member of club  or organization: Not on file    Attends meetings of clubs or organizations: Not on file    Relationship status: Not on file  . Intimate partner violence    Fear of current or ex partner: Not on file    Emotionally abused: Not on file    Physically abused: Not on file    Forced sexual activity: Not on file  Other Topics Concern  . Not on file  Social History Narrative   She is a single mother of 7 (no note of being added divorced or without) 10 grandchildren and 15 great-grandchildren. She lives with her son. Either one of 2 sons or the daughter come with her to appointments. They carry a notebook complete all her medical data. She does all her activities of daily living. She is a former smoker smoked 20 packs a day for 30 years and quit in 2001. She did not drink alcohol. He does not get routine exercise due to dyspnea.   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    Family History  Problem Relation Age of Onset  . Breast cancer Mother   . Heart disease Unknown     ROS General: Negative; No fevers, chills, or night sweats HEENT: Positive for left eye blindness; No changes in  hearing, sinus congestion, difficulty swallowing Pulmonary: positive for occasional wheezing/COPD Cardiovascular: See HPI: No chest pain, presyncope, syncope, palpitations Positive for hyperlipidemia GI: Negative; No nausea, vomiting, diarrhea, or abdominal pain GU: Negative; No dysuria, hematuria, or difficulty voiding Musculoskeletal:  broke her left foot Hematologic: Negative; no easy bruising, bleeding Endocrine: Negative; no heat/cold intolerance; no diabetes, Neuro: Negative; no changes in balance, headaches Skin: Negative; No rashes or skin lesions Psychiatric: Positive for depression No behavioral problems, Sleep: Positive for OSA on CPAP therapy, no daytime sleepiness, hypersomnolence, bruxism, restless legs, hypnogognic hallucinations. Other comprehensive 14 point system review is negative   Physical  Exam BP (!) 142/80   Pulse 77   Temp (!) 97.2 F (36.2 C)   Ht '5\' 3"'$  (1.6 m)   Wt 181 lb (82.1 kg)   SpO2 99%   BMI 32.06 kg/m    Repeat blood pressure by me was 140/80  Wt Readings from Last 3 Encounters:  10/02/19 181 lb (82.1 kg)  09/02/19 171 lb 12.8 oz (77.9 kg)  01/17/19 210 lb 9.6 oz (95.5 kg)   General: Alert, oriented, no distress.  Skin: normal turgor, no rashes, warm and dry HEENT: Normocephalic, atraumatic. Pupils equal round and reactive to light; sclera anicteric; extraocular muscles intact;  Nose without nasal septal hypertrophy Mouth/Parynx benign; Mallinpatti scale 3 Neck: No JVD, no carotid bruits; normal carotid upstroke Lungs: clear to ausculatation and percussion; no wheezing or rales Chest wall: without tenderness to palpitation Heart: PMI not displaced, RRR, s1 s2 normal, 1/6 systolic murmur, no diastolic murmur, no rubs, gallops, thrills, or heaves Abdomen: soft, nontender; no hepatosplenomehaly, BS+; abdominal aorta nontender and not dilated by palpation. Back: no CVA tenderness Pulses 2+ Musculoskeletal: full range of motion, normal strength, no joint deformities Extremities: no clubbing cyanosis or edema, Homan's sign negative  Neurologic: grossly nonfocal; Cranial nerves grossly wnl Psychologic: Normal mood and affect   ECG (independently read by me): Normal sinus rhythm with an isolated PVC.  Borderline LVH.  QS Complex V1 V2.  Normal intervals.  Februay 2019 ECG (independently read by me): Normal sinus rhythm at 70 bpm.  Nonspecific ST changes.  Normal intervals.  No ectopy.  December 2017 ECG (independently read by me): Normal sinus rhythm at 81 bpm.  Nonspecific ST-T changes.  November 2016 ECG (independently read by me):  Normal sinus rhythm at 72 bpm.  Nonspecific T changes.  September 2015 ECG (independently read by me): Normal sinus rhythm at 71 beats per minute.  Nonspecific ST changes.  04/20/2014 ECG (independently read by me):  Normal sinus rhythm at 85 beats per minute.  Nonspecific ST changes.  LABS:  BMP Latest Ref Rng & Units 06/01/2015 05/31/2015 05/30/2015  Glucose 65 - 99 mg/dL 128(H) 187(H) 177(H)  BUN 6 - 20 mg/dL 14 24(H) 29(H)  Creatinine 0.44 - 1.00 mg/dL 1.62(H) 2.06(H) 2.65(H)  Sodium 135 - 145 mmol/L 139 137 137  Potassium 3.5 - 5.1 mmol/L 3.7 3.6 4.1  Chloride 101 - 111 mmol/L 110 109 104  CO2 22 - 32 mmol/L 21(L) 23 19(L)  Calcium 8.9 - 10.3 mg/dL 9.0 8.6(L) 9.0   Hepatic Function Latest Ref Rng & Units 05/31/2015 02/08/2015 11/03/2014  Total Protein 6.5 - 8.1 g/dL 6.0(L) 7.3 7.0  Albumin 3.5 - 5.0 g/dL 3.4(L) 4.3 4.1  AST 15 - 41 U/L '21 21 13  '$ ALT 14 - 54 U/L '15 14 9  '$ Alk Phosphatase 38 - 126 U/L 31(L) 42 63  Total Bilirubin 0.3 - 1.2 mg/dL  0.6 0.6 0.5  Bilirubin, Direct 0.0 - 0.3 mg/dL - - -   CBC Latest Ref Rng & Units 06/01/2015 05/31/2015 05/30/2015  WBC 4.0 - 10.5 K/uL 8.6 7.7 8.9  Hemoglobin 12.0 - 15.0 g/dL 9.5(L) 9.3(L) 10.1(L)  Hematocrit 36.0 - 46.0 % 30.5(L) 29.1(L) 30.7(L)  Platelets 150 - 400 K/uL 313 292 333   Lab Results  Component Value Date   MCV 78.4 06/01/2015   MCV 78.4 05/31/2015   MCV 77.1 (L) 05/30/2015   Lab Results  Component Value Date   TSH 0.255 (L) 12/31/2013   Lab Results  Component Value Date   HGBA1C 9.0 (H) 01/01/2014   Lipid Panel     Component Value Date/Time   CHOL 186 02/08/2015 1345   TRIG 205 (H) 02/08/2015 1345   HDL 47 02/08/2015 1345   CHOLHDL 4.0 02/08/2015 1345   VLDL 41 (H) 02/08/2015 1345   LDLCALC 98 02/08/2015 1345   LDLDIRECT 136.1 11/02/2010 1300   RADIOLOGY: Dg Ribs Unilateral W/chest Right  04/08/2014   CLINICAL DATA:  Fall, right rib pain  EXAM: RIGHT RIBS AND CHEST - 3+ VIEW  COMPARISON:  04/07/2014  FINDINGS: Three views right ribs submitted. No acute infiltrate or pulmonary edema. No rib fracture. No pneumothorax.  IMPRESSION: Negative.   Electronically Signed   By: Lahoma Crocker M.D.   On: 04/08/2014 09:13    IMPRESSION: 1.  Coronary artery disease involving native coronary artery of native heart without angina pectoris   2. Essential hypertension   3. Cerebrovascular accident (CVA), unspecified mechanism ([place])   4. OSA (obstructive sleep apnea)   5. Hyperlipidemia with target LDL less than 70   6. Mild aortic stenosis     ASSESSMENT AND PLAN: CarolOlvey is an 78 year old female, who has known CAD and is status post prior intervention to her LAD and RCA with bare-metal stents.  She developed unstable angina symptoms in March 2015 and underwent staged DES PCI of the diagonal vessel and mid RCA. Due to recurrent chest pain repeat catheterization revealed previous stented segments to be patent but there was progressive disease in a a small third right posterolateral branch for which initial medical therapy was recommended.  Her blood pressure today is slightly elevated on her blood pressure regimen consisting of furosemide, metoprolol 25 mg twice a day, isosorbide 30 mg,.  When I last saw her since she had not had any recurrent anginal symptomatology I recommended reduction of her Brilinta dose to 60 mg twice a day with aspirin based on Pegasys trial data.  Apparently, he now has had issues with fall risk resulting from her legs falling asleep and peripheral neuropathy.  Since she has remained stable I have recommended she discontinue Brilinta.  Apparently she had not been taking baby aspirin and I have suggested aspirin 81 mg daily.  Her physical exam suggestive murmur suggestive of aortic valve disease and she previously had an echo in 2015 demonstrating aortic sclerosis.  She will be moving to [place] area.  I would suggest when she establishes cardiology care in [facility] that a follow-up echo Doppler study be done for further evaluation of her aortic murmur.  She has significant COPD and is on Pulmicort, as needed albuterol, and Spiriva.  I have recommended obtaining follow-up laboratory upon reestablishment with her  new physicians.  I will be available in the future if problems arise.  Time spent: 25 minutes  Troy Sine, MD, [facility], [place]  10/07/2019 7:17 PM

## 2019-10-03 ENCOUNTER — Ambulatory Visit: Payer: Medicare Other

## 2019-10-07 ENCOUNTER — Other Ambulatory Visit (INDEPENDENT_AMBULATORY_CARE_PROVIDER_SITE_OTHER): Payer: Medicare Other

## 2019-10-07 ENCOUNTER — Encounter: Payer: Self-pay | Admitting: Cardiovascular Disease

## 2019-10-07 DIAGNOSIS — I10 Essential (primary) hypertension: Secondary | ICD-10-CM

## 2019-10-07 DIAGNOSIS — I2510 Atherosclerotic heart disease of native coronary artery without angina pectoris: Secondary | ICD-10-CM

## 2019-10-07 DIAGNOSIS — Z01810 Encounter for preprocedural cardiovascular examination: Secondary | ICD-10-CM

## 2019-10-07 DIAGNOSIS — E782 Mixed hyperlipidemia: Secondary | ICD-10-CM

## 2019-10-16 ENCOUNTER — Ambulatory Visit (INDEPENDENT_AMBULATORY_CARE_PROVIDER_SITE_OTHER): Payer: Medicare Other

## 2019-10-16 ENCOUNTER — Other Ambulatory Visit: Payer: Self-pay

## 2019-10-16 DIAGNOSIS — J4551 Severe persistent asthma with (acute) exacerbation: Secondary | ICD-10-CM

## 2019-10-16 MED ORDER — OMALIZUMAB 75 MG/0.5ML ~~LOC~~ SOSY
75.0000 mg | PREFILLED_SYRINGE | Freq: Once | SUBCUTANEOUS | Status: AC
  Administered 2019-10-16: 10:00:00 75 mg via SUBCUTANEOUS

## 2019-10-16 MED ORDER — OMALIZUMAB 150 MG/ML ~~LOC~~ SOSY
300.0000 mg | PREFILLED_SYRINGE | Freq: Once | SUBCUTANEOUS | Status: AC
  Administered 2019-10-16: 10:00:00 300 mg via SUBCUTANEOUS

## 2019-10-16 NOTE — Progress Notes (Signed)
All questions were answered by the patient before medication was administered. Have you been hospitalized in the last 10 days? No Do you have a fever? No Do you have a cough? No Do you have a headache or sore throat? No  

## 2019-10-20 ENCOUNTER — Telehealth: Payer: Self-pay | Admitting: Pulmonary Disease

## 2019-10-20 NOTE — Telephone Encounter (Signed)
[name] Prefilled Syringe Order: 150mg  Prefilled Syringe:  #4 75mg  Prefilled Syringe: #2 Ordered Date: 10/20/2019 Expected date of arrival: 10/21/2019 Ordered by: Desmond Dike, [place]  Specialty Pharmacy: [facility]

## 2019-10-21 NOTE — Telephone Encounter (Signed)
[name] Prefilled Syringe Received:  150mg  Prefilled Syringe >> quantity #4, lot # [id number], exp date 05/2020 75mg  Prefilled Syringe >> quantity #2, lot # [id number], exp date 05/2020 Medication arrival date: 10/21/2019 Received by: Desmond Dike, [place]

## 2019-10-30 ENCOUNTER — Ambulatory Visit (INDEPENDENT_AMBULATORY_CARE_PROVIDER_SITE_OTHER): Payer: Medicare Other

## 2019-10-30 ENCOUNTER — Other Ambulatory Visit: Payer: Self-pay

## 2019-10-30 DIAGNOSIS — J4551 Severe persistent asthma with (acute) exacerbation: Secondary | ICD-10-CM

## 2019-10-30 MED ORDER — OMALIZUMAB 75 MG/0.5ML ~~LOC~~ SOSY
75.0000 mg | PREFILLED_SYRINGE | Freq: Once | SUBCUTANEOUS | Status: AC
  Administered 2019-10-30: 75 mg via SUBCUTANEOUS

## 2019-10-30 MED ORDER — OMALIZUMAB 150 MG/ML ~~LOC~~ SOSY
300.0000 mg | PREFILLED_SYRINGE | Freq: Once | SUBCUTANEOUS | Status: AC
  Administered 2019-10-30: 300 mg via SUBCUTANEOUS

## 2019-10-30 NOTE — Progress Notes (Signed)
Have you been hospitalized within the last 10 days?  No Do you have a fever?  No Do you have a cough?  No Do you have a headache or sore throat? No Do you have your Epi Pen visible and is it within date?  Yes 

## 2019-12-01 DEATH — deceased
